# Patient Record
Sex: Female | Born: 1950
Health system: Southern US, Community
[De-identification: ages and names within clinical notes are randomized; demographics above are authoritative.]

## PROBLEM LIST (undated history)

## (undated) DIAGNOSIS — R809 Proteinuria, unspecified: Secondary | ICD-10-CM

## (undated) DIAGNOSIS — L409 Psoriasis, unspecified: Secondary | ICD-10-CM

## (undated) DIAGNOSIS — I10 Essential (primary) hypertension: Secondary | ICD-10-CM

## (undated) DIAGNOSIS — E119 Type 2 diabetes mellitus without complications: Secondary | ICD-10-CM

## (undated) DIAGNOSIS — R928 Other abnormal and inconclusive findings on diagnostic imaging of breast: Secondary | ICD-10-CM

## (undated) DIAGNOSIS — C541 Malignant neoplasm of endometrium: Secondary | ICD-10-CM

## (undated) DIAGNOSIS — L4050 Arthropathic psoriasis, unspecified: Secondary | ICD-10-CM

## (undated) DIAGNOSIS — E785 Hyperlipidemia, unspecified: Secondary | ICD-10-CM

## (undated) DIAGNOSIS — M79644 Pain in right finger(s): Secondary | ICD-10-CM

## (undated) DIAGNOSIS — M1710 Unilateral primary osteoarthritis, unspecified knee: Secondary | ICD-10-CM

## (undated) DIAGNOSIS — Q752 Hypertelorism: Secondary | ICD-10-CM

## (undated) DIAGNOSIS — E559 Vitamin D deficiency, unspecified: Secondary | ICD-10-CM

## (undated) DIAGNOSIS — Z6841 Body Mass Index (BMI) 40.0 and over, adult: Secondary | ICD-10-CM

## (undated) DIAGNOSIS — D649 Anemia, unspecified: Secondary | ICD-10-CM

## (undated) DIAGNOSIS — E039 Hypothyroidism, unspecified: Secondary | ICD-10-CM

## (undated) DIAGNOSIS — H11009 Unspecified pterygium of unspecified eye: Secondary | ICD-10-CM

## (undated) DIAGNOSIS — Z8619 Personal history of other infectious and parasitic diseases: Secondary | ICD-10-CM

## (undated) DIAGNOSIS — Z923 Personal history of irradiation: Secondary | ICD-10-CM

## (undated) HISTORY — DX: Essential (primary) hypertension: I10

## (undated) HISTORY — DX: Hypothyroidism, unspecified: E03.9

## (undated) HISTORY — DX: Other abnormal and inconclusive findings on diagnostic imaging of breast: R92.8

## (undated) HISTORY — DX: Unilateral primary osteoarthritis, unspecified knee: M17.10

## (undated) HISTORY — PX: COLONOSCOPY: SHX174

## (undated) HISTORY — DX: Hyperlipidemia, unspecified: E78.5

## (undated) HISTORY — DX: Psoriasis, unspecified: L40.9

## (undated) HISTORY — DX: Vitamin D deficiency, unspecified: E55.9

## (undated) HISTORY — DX: Morbid (severe) obesity due to excess calories: E66.01

## (undated) HISTORY — DX: Hypertelorism: Q75.2

## (undated) HISTORY — DX: Body Mass Index (BMI) 40.0 and over, adult: Z684

## (undated) HISTORY — DX: Proteinuria, unspecified: R80.9

## (undated) HISTORY — DX: Personal history of irradiation: Z92.3

## (undated) HISTORY — PX: KNEE SURGERY: SHX244

## (undated) HISTORY — DX: Personal history of other infectious and parasitic diseases: Z86.19

## (undated) HISTORY — DX: Type 2 diabetes mellitus without complications: E11.9

## (undated) HISTORY — DX: Pain in right finger(s): M79.644

## (undated) HISTORY — DX: Unspecified pterygium of unspecified eye: H11.009

## (undated) MED FILL — Fosaprepitant Dimeglumine For IV Infusion 150 MG (Base Eq): INTRAVENOUS | Qty: 5 | Status: AC

---

## 2002-09-01 ENCOUNTER — Ambulatory Visit (HOSPITAL_COMMUNITY): Admission: RE | Admit: 2002-09-01 | Discharge: 2002-09-01 | Payer: Self-pay | Admitting: Internal Medicine

## 2006-05-20 ENCOUNTER — Ambulatory Visit: Payer: Self-pay

## 2007-07-01 LAB — HM DEXA SCAN: HM DEXA SCAN: NORMAL

## 2008-01-13 ENCOUNTER — Ambulatory Visit: Payer: Self-pay | Admitting: Family Medicine

## 2011-10-04 ENCOUNTER — Encounter: Payer: Self-pay | Admitting: Rheumatology

## 2011-10-23 ENCOUNTER — Encounter: Payer: Self-pay | Admitting: Rheumatology

## 2011-11-23 ENCOUNTER — Encounter: Payer: Self-pay | Admitting: Rheumatology

## 2012-03-31 ENCOUNTER — Ambulatory Visit: Payer: Self-pay | Admitting: Family Medicine

## 2012-04-16 ENCOUNTER — Ambulatory Visit: Payer: Self-pay | Admitting: Family Medicine

## 2013-02-05 LAB — HM HEPATITIS C SCREENING LAB: HM Hepatitis Screen: NEGATIVE

## 2013-06-22 ENCOUNTER — Ambulatory Visit: Payer: Self-pay | Admitting: Family Medicine

## 2013-06-22 LAB — HM MAMMOGRAPHY

## 2013-07-05 ENCOUNTER — Ambulatory Visit: Payer: Self-pay | Admitting: Family Medicine

## 2014-03-14 ENCOUNTER — Ambulatory Visit: Payer: Self-pay | Admitting: Family Medicine

## 2014-06-14 LAB — HM COLONOSCOPY: HM Colonoscopy: NEGATIVE

## 2014-07-20 LAB — HM PAP SMEAR: HM Pap smear: NORMAL

## 2014-10-08 LAB — LIPID PANEL
Cholesterol: 107 mg/dL (ref 0–200)
HDL: 45 mg/dL (ref 35–70)
LDL Cholesterol: 45 mg/dL
Triglycerides: 86 mg/dL (ref 40–160)

## 2014-10-17 LAB — HEMOGLOBIN A1C: Hgb A1c MFr Bld: 6 % (ref 4.0–6.0)

## 2014-10-18 ENCOUNTER — Other Ambulatory Visit: Payer: Self-pay | Admitting: Family Medicine

## 2014-11-01 ENCOUNTER — Other Ambulatory Visit: Payer: Self-pay | Admitting: Family Medicine

## 2014-11-01 DIAGNOSIS — Z1231 Encounter for screening mammogram for malignant neoplasm of breast: Secondary | ICD-10-CM

## 2014-11-08 ENCOUNTER — Ambulatory Visit: Payer: Self-pay | Attending: Family Medicine

## 2015-01-07 ENCOUNTER — Other Ambulatory Visit: Payer: Self-pay | Admitting: Family Medicine

## 2015-02-21 ENCOUNTER — Encounter: Payer: Self-pay | Admitting: Family Medicine

## 2015-03-12 ENCOUNTER — Encounter: Payer: Self-pay | Admitting: Family Medicine

## 2015-03-12 DIAGNOSIS — I10 Essential (primary) hypertension: Secondary | ICD-10-CM | POA: Insufficient documentation

## 2015-03-12 DIAGNOSIS — Q752 Hypertelorism: Secondary | ICD-10-CM | POA: Insufficient documentation

## 2015-03-12 DIAGNOSIS — Z923 Personal history of irradiation: Secondary | ICD-10-CM | POA: Insufficient documentation

## 2015-03-12 DIAGNOSIS — H11009 Unspecified pterygium of unspecified eye: Secondary | ICD-10-CM | POA: Insufficient documentation

## 2015-03-12 DIAGNOSIS — E785 Hyperlipidemia, unspecified: Secondary | ICD-10-CM | POA: Insufficient documentation

## 2015-03-12 DIAGNOSIS — Z8619 Personal history of other infectious and parasitic diseases: Secondary | ICD-10-CM | POA: Insufficient documentation

## 2015-03-12 DIAGNOSIS — R809 Proteinuria, unspecified: Secondary | ICD-10-CM | POA: Insufficient documentation

## 2015-03-12 DIAGNOSIS — L409 Psoriasis, unspecified: Secondary | ICD-10-CM | POA: Insufficient documentation

## 2015-03-12 DIAGNOSIS — M1710 Unilateral primary osteoarthritis, unspecified knee: Secondary | ICD-10-CM | POA: Insufficient documentation

## 2015-03-12 DIAGNOSIS — E559 Vitamin D deficiency, unspecified: Secondary | ICD-10-CM | POA: Insufficient documentation

## 2015-03-13 ENCOUNTER — Ambulatory Visit (INDEPENDENT_AMBULATORY_CARE_PROVIDER_SITE_OTHER): Payer: BLUE CROSS/BLUE SHIELD | Admitting: Family Medicine

## 2015-03-13 ENCOUNTER — Encounter: Payer: Self-pay | Admitting: Family Medicine

## 2015-03-13 VITALS — BP 136/84 | HR 108 | Temp 98.8°F | Resp 16 | Ht 67.0 in | Wt 308.2 lb

## 2015-03-13 DIAGNOSIS — E8881 Metabolic syndrome: Secondary | ICD-10-CM | POA: Diagnosis not present

## 2015-03-13 DIAGNOSIS — E032 Hypothyroidism due to medicaments and other exogenous substances: Secondary | ICD-10-CM

## 2015-03-13 DIAGNOSIS — E119 Type 2 diabetes mellitus without complications: Secondary | ICD-10-CM

## 2015-03-13 DIAGNOSIS — Z23 Encounter for immunization: Secondary | ICD-10-CM

## 2015-03-13 DIAGNOSIS — Z923 Personal history of irradiation: Secondary | ICD-10-CM

## 2015-03-13 DIAGNOSIS — E785 Hyperlipidemia, unspecified: Secondary | ICD-10-CM

## 2015-03-13 DIAGNOSIS — Z9889 Other specified postprocedural states: Secondary | ICD-10-CM | POA: Diagnosis not present

## 2015-03-13 LAB — POCT UA - MICROALBUMIN: MICROALBUMIN (UR) POC: NEGATIVE mg/L

## 2015-03-13 LAB — POCT GLYCOSYLATED HEMOGLOBIN (HGB A1C): Hemoglobin A1C: 6.2

## 2015-03-13 MED ORDER — ATORVASTATIN CALCIUM 40 MG PO TABS: 40.0000 mg | ORAL_TABLET | Freq: Every day | ORAL | Status: DC

## 2015-03-13 MED ORDER — LEVOTHYROXINE SODIUM 175 MCG PO TABS: 175.0000 ug | ORAL_TABLET | Freq: Every day | ORAL | Status: DC

## 2015-03-13 NOTE — Patient Instructions (Signed)
Lipid panel shows low HDL : to improve HDL patient  needs to eat tree nuts ( pecans/pistachios/almonds ) four times weekly, eat fish two times weekly  and exercise  at least 150 minutes per week  Discussed with the patient the risk posed by an increased BMI. Discussed importance of portion control, calorie counting and at least 150 minutes of physical activity weekly. Avoid sweet beverages and drink more water. Eat at least 6 servings of fruit and vegetables daily
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
## 2015-03-18 ENCOUNTER — Other Ambulatory Visit
Admission: RE | Admit: 2015-03-18 | Discharge: 2015-03-18 | Disposition: A | Discharge status: Home / Self Care | Payer: BLUE CROSS/BLUE SHIELD | Source: Ambulatory Visit | Attending: Family Medicine | Admitting: Family Medicine

## 2015-03-18 DIAGNOSIS — E032 Hypothyroidism due to medicaments and other exogenous substances: Secondary | ICD-10-CM | POA: Insufficient documentation

## 2015-03-18 LAB — TSH: TSH: 5.661 u[IU]/mL — ABNORMAL HIGH (ref 0.350–4.500)

## 2015-04-18 ENCOUNTER — Ambulatory Visit: Payer: Self-pay | Admitting: Family Medicine

## 2015-05-26 ENCOUNTER — Ambulatory Visit (INDEPENDENT_AMBULATORY_CARE_PROVIDER_SITE_OTHER): Payer: BLUE CROSS/BLUE SHIELD | Admitting: Family Medicine

## 2015-05-26 ENCOUNTER — Encounter: Payer: Self-pay | Admitting: Family Medicine

## 2015-05-26 VITALS — BP 118/86 | HR 104 | Temp 98.4°F | Resp 14 | Ht 67.0 in | Wt 308.0 lb

## 2015-05-26 DIAGNOSIS — M7662 Achilles tendinitis, left leg: Secondary | ICD-10-CM | POA: Diagnosis not present

## 2015-05-26 DIAGNOSIS — J309 Allergic rhinitis, unspecified: Secondary | ICD-10-CM

## 2015-05-26 DIAGNOSIS — Z1239 Encounter for other screening for malignant neoplasm of breast: Secondary | ICD-10-CM | POA: Diagnosis not present

## 2015-05-26 DIAGNOSIS — J3089 Other allergic rhinitis: Secondary | ICD-10-CM | POA: Insufficient documentation

## 2015-05-26 MED ORDER — MELOXICAM 15 MG PO TABS: 15.0000 mg | ORAL_TABLET | Freq: Every day | ORAL | Status: DC

## 2015-05-26 MED ORDER — FLUTICASONE PROPIONATE 50 MCG/ACT NA SUSP: 2.0000 | Freq: Every day | NASAL | Status: DC
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
## 2015-05-26 NOTE — Patient Instructions (Signed)
Achilles Tendinitis Achilles tendinitis is inflammation of the tough, cord-like band that attaches the lower muscles of your leg to your heel (Achilles tendon). It is usually caused by overusing the tendon and joint involved.  CAUSES Achilles tendinitis can happen because of:  A sudden increase in exercise or activity (such as running).  Doing the same exercises or activities (such as jumping) over and over.  Not warming up calf muscles before exercising.  Exercising in shoes that are worn out or not made for exercise.  Having arthritis or a bone growth on the back of the heel bone. This can rub against the tendon and hurt the tendon. SIGNS AND SYMPTOMS The most common symptoms are:  Pain in the back of the leg, just above the heel. The pain usually gets worse with exercise and better with rest.  Stiffness or soreness in the back of the leg, especially in the morning.  Swelling of the skin over the Achilles tendon.  Trouble standing on tiptoe. Sometimes, an Achilles tendon tears (ruptures). Symptoms of an Achilles tendon rupture can include:  Sudden, severe pain in the back of the leg.  Trouble putting weight on the foot or walking normally. DIAGNOSIS Achilles tendinitis will be diagnosed based on symptoms and a physical examination. An X-ray may be done to check if another condition is causing your symptoms. An MRI may be ordered if your health care provider suspects you may have completely torn your tendon, which is called an Achilles tendon rupture.  TREATMENT  Achilles tendinitis usually gets better over time. It can take weeks to months to heal completely. Treatment focuses on treating the symptoms and helping the injury heal. HOME CARE INSTRUCTIONS   Rest your Achilles tendon and avoid activities that cause pain.  Apply ice to the injured area:  Put ice in a plastic bag.  Place a towel between your skin and the bag.  Leave the ice on for 20 minutes, 2-3 times a  day  Try to avoid using the tendon (other than gentle range of motion) while the tendon is painful. Do not resume use until instructed by your health care provider. Then begin use gradually. Do not increase use to the point of pain. If pain does develop, decrease use and continue the above measures. Gradually increase activities that do not cause discomfort until you achieve normal use.  Do exercises to make your calf muscles stronger and more flexible. Your health care provider or physical therapist can recommend exercises for you to do.  Wrap your ankle with an elastic bandage or other wrap. This can help keep your tendon from moving too much. Your health care provider will show you how to wrap your ankle correctly.  Only take over-the-counter or prescription medicines for pain, discomfort, or fever as directed by your health care provider. SEEK MEDICAL CARE IF:   Your pain and swelling increase or pain is uncontrolled with medicines.  You develop new, unexplained symptoms or your symptoms get worse.  You are unable to move your toes or foot.  You develop warmth and swelling in your foot.  You have an unexplained temperature. MAKE SURE YOU:   Understand these instructions.  Will watch your condition.  Will get help right away if you are not doing well or get worse.   This information is not intended to replace advice given to you by your health care provider. Make sure you discuss any questions you have with your health care provider.   Document Released:   03/20/2005 Document Revised: 07/01/2014 Document Reviewed: 01/20/2013 [employer] Interactive Patient Education 2016 [employer]  

## 2015-07-10 ENCOUNTER — Other Ambulatory Visit: Payer: Self-pay | Admitting: Family Medicine

## 2015-07-10 ENCOUNTER — Ambulatory Visit
Admission: RE | Admit: 2015-07-10 | Discharge: 2015-07-10 | Disposition: A | Discharge status: Home / Self Care | Payer: BLUE CROSS/BLUE SHIELD | Source: Ambulatory Visit | Attending: Family Medicine | Admitting: Family Medicine

## 2015-07-10 DIAGNOSIS — Z1239 Encounter for other screening for malignant neoplasm of breast: Secondary | ICD-10-CM

## 2015-07-10 DIAGNOSIS — Z1231 Encounter for screening mammogram for malignant neoplasm of breast: Secondary | ICD-10-CM | POA: Insufficient documentation

## 2015-07-10 DIAGNOSIS — R928 Other abnormal and inconclusive findings on diagnostic imaging of breast: Secondary | ICD-10-CM

## 2015-07-14 ENCOUNTER — Ambulatory Visit (INDEPENDENT_AMBULATORY_CARE_PROVIDER_SITE_OTHER): Payer: BLUE CROSS/BLUE SHIELD | Admitting: Family Medicine

## 2015-07-14 ENCOUNTER — Encounter: Payer: Self-pay | Admitting: Family Medicine

## 2015-07-14 VITALS — BP 122/88 | HR 107 | Temp 98.0°F | Resp 16 | Wt 309.9 lb

## 2015-07-14 DIAGNOSIS — E785 Hyperlipidemia, unspecified: Secondary | ICD-10-CM | POA: Diagnosis not present

## 2015-07-14 DIAGNOSIS — E1129 Type 2 diabetes mellitus with other diabetic kidney complication: Secondary | ICD-10-CM | POA: Diagnosis not present

## 2015-07-14 DIAGNOSIS — R809 Proteinuria, unspecified: Secondary | ICD-10-CM | POA: Diagnosis not present

## 2015-07-14 DIAGNOSIS — I10 Essential (primary) hypertension: Secondary | ICD-10-CM | POA: Diagnosis not present

## 2015-07-14 DIAGNOSIS — E032 Hypothyroidism due to medicaments and other exogenous substances: Secondary | ICD-10-CM

## 2015-07-14 DIAGNOSIS — Z23 Encounter for immunization: Secondary | ICD-10-CM | POA: Diagnosis not present

## 2015-07-14 DIAGNOSIS — Z923 Personal history of irradiation: Secondary | ICD-10-CM

## 2015-07-14 DIAGNOSIS — M7662 Achilles tendinitis, left leg: Secondary | ICD-10-CM

## 2015-07-14 LAB — POCT GLYCOSYLATED HEMOGLOBIN (HGB A1C): Hemoglobin A1C: 6

## 2015-07-14 LAB — POCT UA - MICROALBUMIN: MICROALBUMIN (UR) POC: NEGATIVE mg/L

## 2015-07-14 MED ORDER — MELOXICAM 15 MG PO TABS: 15.0000 mg | ORAL_TABLET | Freq: Every day | ORAL | Status: DC

## 2015-07-14 MED ORDER — LEVOTHYROXINE SODIUM 175 MCG PO TABS: 175.0000 ug | ORAL_TABLET | Freq: Every day | ORAL | Status: DC

## 2015-07-14 MED ORDER — IRBESARTAN-HYDROCHLOROTHIAZIDE 150-12.5 MG PO TABS: 1.0000 | ORAL_TABLET | Freq: Every day | ORAL | Status: DC

## 2015-07-14 MED ORDER — ATORVASTATIN CALCIUM 40 MG PO TABS: 40.0000 mg | ORAL_TABLET | Freq: Every day | ORAL | Status: DC
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
## 2015-07-14 NOTE — Addendum Note (Signed)
Addended by: Inda Coke on: 07/14/2015 09:36 AM   Modules accepted: Orders

## 2015-07-15 ENCOUNTER — Other Ambulatory Visit
Admission: RE | Admit: 2015-07-15 | Discharge: 2015-07-15 | Disposition: A | Discharge status: Home / Self Care | Payer: BLUE CROSS/BLUE SHIELD | Source: Ambulatory Visit | Attending: Family Medicine | Admitting: Family Medicine

## 2015-07-15 DIAGNOSIS — Z029 Encounter for administrative examinations, unspecified: Secondary | ICD-10-CM | POA: Diagnosis present

## 2015-07-15 LAB — COMPREHENSIVE METABOLIC PANEL
ALT: 24 U/L (ref 14–54)
ANION GAP: 5 (ref 5–15)
AST: 29 U/L (ref 15–41)
Albumin: 4 g/dL (ref 3.5–5.0)
Alkaline Phosphatase: 76 U/L (ref 38–126)
BILIRUBIN TOTAL: 0.9 mg/dL (ref 0.3–1.2)
BUN: 14 mg/dL (ref 6–20)
CO2: 29 mmol/L (ref 22–32)
Calcium: 9.1 mg/dL (ref 8.9–10.3)
Chloride: 106 mmol/L (ref 101–111)
Creatinine, Ser: 0.77 mg/dL (ref 0.44–1.00)
GFR calc Af Amer: >60 mL/min (ref >60)
Glucose, Bld: 104 mg/dL — ABNORMAL HIGH (ref 65–99)
POTASSIUM: 4.1 mmol/L (ref 3.5–5.1)
Sodium: 140 mmol/L (ref 135–145)
TOTAL PROTEIN: 8 g/dL (ref 6.5–8.1)

## 2015-07-15 LAB — LIPID PANEL
CHOL/HDL RATIO: 3.7 ratio
CHOLESTEROL: 164 mg/dL (ref 0–200)
HDL: 44 mg/dL (ref >40)
LDL CALC: 97 mg/dL (ref 0–99)
TRIGLYCERIDES: 115 mg/dL (ref <150)
VLDL: 23 mg/dL (ref 0–40)

## 2015-07-15 LAB — TSH: TSH: 9.264 u[IU]/mL — AB (ref 0.350–4.500)

## 2015-07-16 ENCOUNTER — Other Ambulatory Visit: Payer: Self-pay | Admitting: Family Medicine

## 2015-07-16 DIAGNOSIS — E032 Hypothyroidism due to medicaments and other exogenous substances: Secondary | ICD-10-CM

## 2015-07-16 DIAGNOSIS — E038 Other specified hypothyroidism: Secondary | ICD-10-CM

## 2015-07-16 MED ORDER — LEVOTHYROXINE SODIUM 175 MCG PO TABS: 175.0000 ug | ORAL_TABLET | Freq: Every day | ORAL | Status: DC

## 2015-07-28 ENCOUNTER — Other Ambulatory Visit: Payer: Self-pay | Admitting: Family Medicine

## 2015-07-28 NOTE — Telephone Encounter (Signed)
Patient requesting refill. 

## 2015-08-18 ENCOUNTER — Ambulatory Visit
Admission: RE | Admit: 2015-08-18 | Discharge: 2015-08-18 | Disposition: A | Discharge status: Home / Self Care | Payer: BLUE CROSS/BLUE SHIELD | Source: Ambulatory Visit | Attending: Family Medicine | Admitting: Family Medicine

## 2015-08-18 DIAGNOSIS — N6001 Solitary cyst of right breast: Secondary | ICD-10-CM | POA: Insufficient documentation

## 2015-08-18 DIAGNOSIS — R928 Other abnormal and inconclusive findings on diagnostic imaging of breast: Secondary | ICD-10-CM | POA: Insufficient documentation

## 2015-08-26 ENCOUNTER — Other Ambulatory Visit
Admission: RE | Admit: 2015-08-26 | Discharge: 2015-08-26 | Disposition: A | Discharge status: Home / Self Care | Payer: BLUE CROSS/BLUE SHIELD | Source: Ambulatory Visit | Attending: Family Medicine | Admitting: Family Medicine

## 2015-08-26 DIAGNOSIS — E038 Other specified hypothyroidism: Secondary | ICD-10-CM | POA: Diagnosis present

## 2015-08-26 LAB — TSH: TSH: 2.251 u[IU]/mL (ref 0.350–4.500)

## 2015-08-27 ENCOUNTER — Other Ambulatory Visit: Payer: Self-pay | Admitting: Family Medicine

## 2015-11-13 ENCOUNTER — Ambulatory Visit: Payer: BLUE CROSS/BLUE SHIELD | Admitting: Family Medicine

## 2016-01-28 ENCOUNTER — Other Ambulatory Visit: Payer: Self-pay | Admitting: Family Medicine

## 2016-01-28 DIAGNOSIS — E032 Hypothyroidism due to medicaments and other exogenous substances: Secondary | ICD-10-CM

## 2016-01-29 NOTE — Telephone Encounter (Signed)
Have tried contacting this patient all day and the line is still busy. Not able to reach patient.

## 2016-06-23 IMAGING — MG MM DIAG BREAST TOMO UNI RIGHT
6 series · 6 of 14 positions shown · non-contrast
Comparison: Previous exam(s).

CLINICAL DATA: 64-year-old female for evaluation of possible right
breast mass on screening mammogram.

EXAM:
DIGITAL DIAGNOSTIC RIGHT MAMMOGRAM WITH 3D TOMOSYNTHESIS WITH CAD
ULTRASOUND RIGHT BREAST

[R MLO]
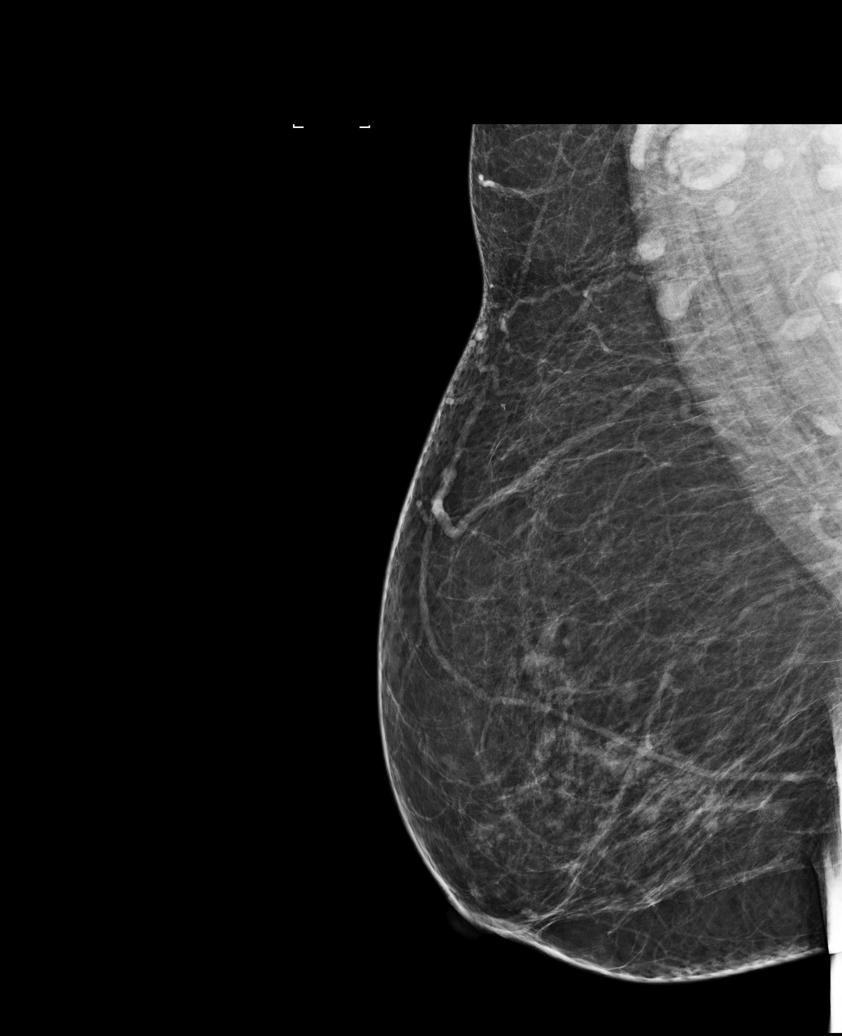

[R MLO synth-2D]
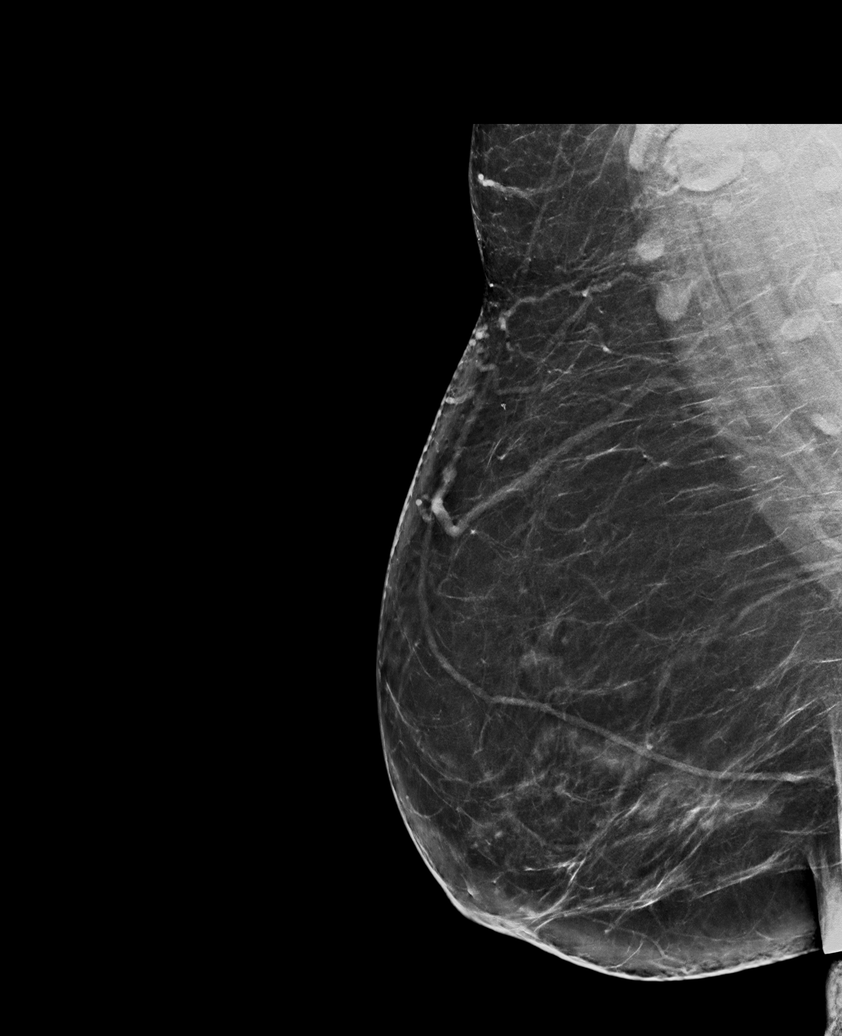

[R CC]
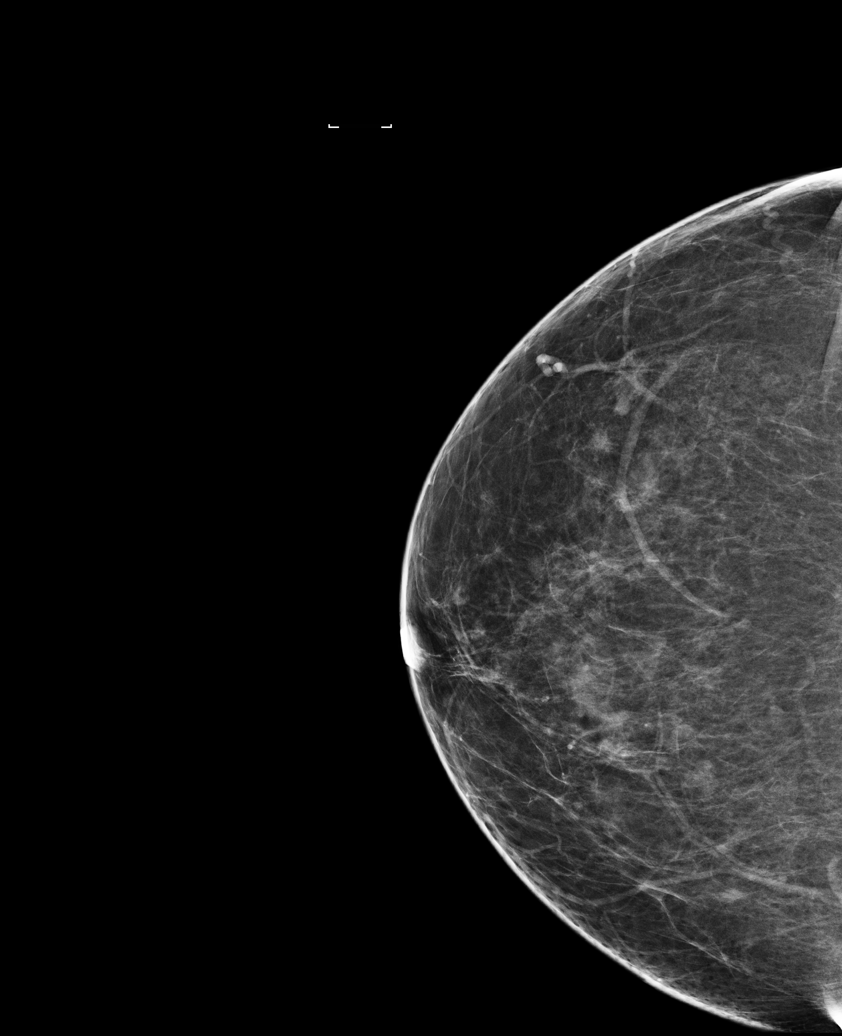

[R CC synth-2D]
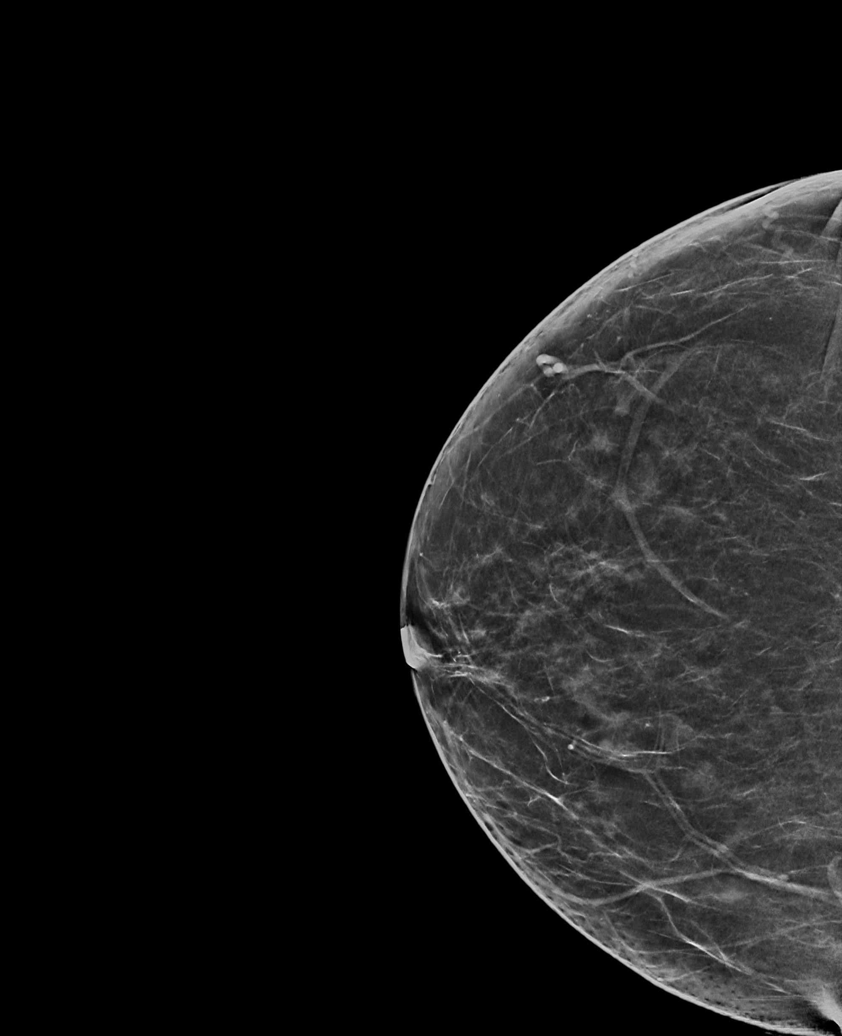

[R CC tomo · tomo slice 31/62.0]
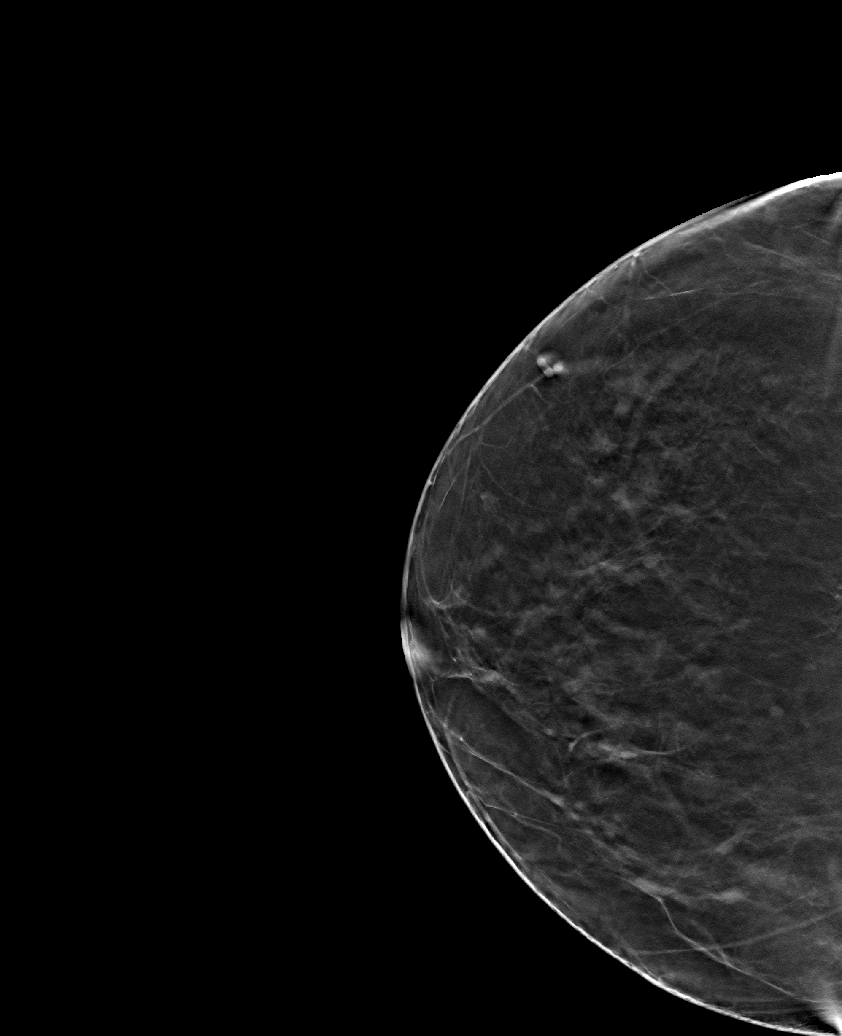

[R MLO tomo · tomo slice 45/88.0]
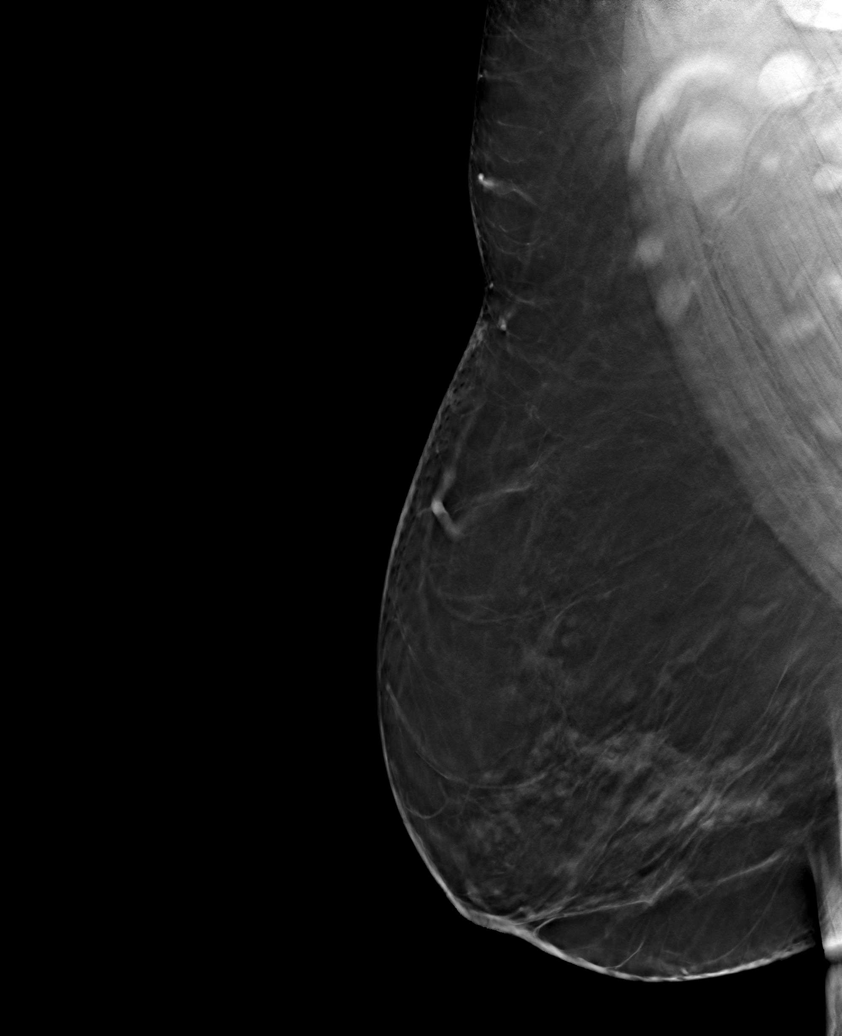

[6 of 14 positions shown; findings below may reference images not displayed]

ACR Breast Density Category b: There are scattered areas of
fibroglandular density.
FINDINGS: 2D and 3D full field views of the right breast demonstrate a
circumscribed oval mass within the inner right breast.

Mammographic images were processed with CAD.

On physical exam, no palpable abnormalities identified within the
inner right breast.

Targeted ultrasound is performed, showing a 6 x 2 x 5 mm simple cyst
at the 3 o'clock position of the right breast 6 cm from the nipple,
corresponding to the screening study finding. There is no evidence
of solid mass, distortion or worrisome shadowing within the inner
right breast.
IMPRESSION: Benign cyst in the inner right breast corresponding to the screening
study finding.

RECOMMENDATION:
Bilateral screening mammograms in 1 year.

I have discussed the findings and recommendations with the patient.
Results were also provided in writing at the conclusion of the
visit. If applicable, a reminder letter will be sent to the patient
regarding the next appointment.

BI-RADS CATEGORY  2: Benign.

## 2016-07-17 ENCOUNTER — Other Ambulatory Visit: Payer: Self-pay | Admitting: Family Medicine

## 2016-07-17 NOTE — Telephone Encounter (Signed)
Patient requesting refill of Irbesartan-HCTZ to [facility].

## 2016-07-23 ENCOUNTER — Telehealth: Payer: Self-pay | Admitting: Family Medicine

## 2016-07-23 DIAGNOSIS — E032 Hypothyroidism due to medicaments and other exogenous substances: Secondary | ICD-10-CM

## 2016-07-23 NOTE — Telephone Encounter (Signed)
LFT MESS ON CELL AND HM PHONE TO [facility] APPT

## 2016-07-24 ENCOUNTER — Ambulatory Visit (INDEPENDENT_AMBULATORY_CARE_PROVIDER_SITE_OTHER): Payer: BLUE CROSS/BLUE SHIELD | Admitting: Family Medicine

## 2016-07-24 ENCOUNTER — Encounter: Payer: Self-pay | Admitting: Family Medicine

## 2016-07-24 VITALS — BP 124/78 | HR 117 | Temp 98.8°F | Resp 18 | Ht 67.0 in | Wt 293.1 lb

## 2016-07-24 DIAGNOSIS — E1129 Type 2 diabetes mellitus with other diabetic kidney complication: Secondary | ICD-10-CM

## 2016-07-24 DIAGNOSIS — Z923 Personal history of irradiation: Secondary | ICD-10-CM

## 2016-07-24 DIAGNOSIS — E032 Hypothyroidism due to medicaments and other exogenous substances: Secondary | ICD-10-CM | POA: Diagnosis not present

## 2016-07-24 DIAGNOSIS — E785 Hyperlipidemia, unspecified: Secondary | ICD-10-CM

## 2016-07-24 DIAGNOSIS — I10 Essential (primary) hypertension: Secondary | ICD-10-CM

## 2016-07-24 DIAGNOSIS — Z1231 Encounter for screening mammogram for malignant neoplasm of breast: Secondary | ICD-10-CM

## 2016-07-24 DIAGNOSIS — R809 Proteinuria, unspecified: Secondary | ICD-10-CM | POA: Diagnosis not present

## 2016-07-24 DIAGNOSIS — Z1239 Encounter for other screening for malignant neoplasm of breast: Secondary | ICD-10-CM

## 2016-07-24 LAB — COMPLETE METABOLIC PANEL WITH GFR
ALBUMIN: 3.5 g/dL — AB (ref 3.6–5.1)
ALK PHOS: 96 U/L (ref 33–130)
ALT: 16 U/L (ref 6–29)
AST: 23 U/L (ref 10–35)
BILIRUBIN TOTAL: 0.4 mg/dL (ref 0.2–1.2)
BUN: 16 mg/dL (ref 7–25)
CALCIUM: 9.1 mg/dL (ref 8.6–10.4)
CO2: 28 mmol/L (ref 20–31)
Chloride: 104 mmol/L (ref 98–110)
Creat: 0.84 mg/dL (ref 0.50–0.99)
GFR, EST AFRICAN AMERICAN: 86 mL/min (ref >=60)
GFR, EST NON AFRICAN AMERICAN: 74 mL/min (ref >=60)
GLUCOSE: 99 mg/dL (ref 65–99)
Potassium: 4.1 mmol/L (ref 3.5–5.3)
SODIUM: 139 mmol/L (ref 135–146)
TOTAL PROTEIN: 7.7 g/dL (ref 6.1–8.1)

## 2016-07-24 LAB — LIPID PANEL
Cholesterol: 134 mg/dL (ref <200)
HDL: 40 mg/dL — ABNORMAL LOW (ref >50)
LDL Cholesterol: 77 mg/dL (ref <100)
Total CHOL/HDL Ratio: 3.4 Ratio (ref <5.0)
Triglycerides: 86 mg/dL (ref <150)
VLDL: 17 mg/dL (ref <30)

## 2016-07-24 LAB — HEMOGLOBIN A1C
HEMOGLOBIN A1C: 5.7 % — AB (ref <5.7)
Mean Plasma Glucose: 117 mg/dL

## 2016-07-24 LAB — POCT UA - MICROALBUMIN: Microalbumin Ur, POC: 20 mg/L

## 2016-07-24 LAB — TSH: TSH: 0.53 m[IU]/L

## 2016-07-24 MED ORDER — IRBESARTAN-HYDROCHLOROTHIAZIDE 150-12.5 MG PO TABS: 1.0000 | ORAL_TABLET | Freq: Every day | ORAL | 1 refills | Status: DC
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
## 2016-07-25 ENCOUNTER — Encounter: Payer: Self-pay | Admitting: Family Medicine

## 2016-07-25 DIAGNOSIS — E46 Unspecified protein-calorie malnutrition: Secondary | ICD-10-CM | POA: Insufficient documentation

## 2016-07-25 DIAGNOSIS — E441 Mild protein-calorie malnutrition: Secondary | ICD-10-CM | POA: Insufficient documentation

## 2016-08-28 ENCOUNTER — Other Ambulatory Visit: Payer: Self-pay | Admitting: Family Medicine

## 2016-08-28 DIAGNOSIS — E032 Hypothyroidism due to medicaments and other exogenous substances: Secondary | ICD-10-CM

## 2016-10-04 ENCOUNTER — Encounter: Payer: Self-pay | Admitting: Family Medicine

## 2016-10-04 ENCOUNTER — Ambulatory Visit (INDEPENDENT_AMBULATORY_CARE_PROVIDER_SITE_OTHER): Payer: BLUE CROSS/BLUE SHIELD | Admitting: Family Medicine

## 2016-10-04 VITALS — BP 128/72 | HR 106 | Temp 98.0°F | Resp 16 | Ht 67.0 in | Wt 282.0 lb

## 2016-10-04 DIAGNOSIS — Z01419 Encounter for gynecological examination (general) (routine) without abnormal findings: Secondary | ICD-10-CM | POA: Diagnosis not present

## 2016-10-04 DIAGNOSIS — E032 Hypothyroidism due to medicaments and other exogenous substances: Secondary | ICD-10-CM | POA: Diagnosis not present

## 2016-10-04 LAB — TSH: TSH: 1.05 mIU/L
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
## 2016-10-04 NOTE — Patient Instructions (Signed)

## 2016-10-16 ENCOUNTER — Ambulatory Visit
Admission: RE | Admit: 2016-10-16 | Discharge: 2016-10-16 | Disposition: A | Discharge status: Home / Self Care | Payer: BLUE CROSS/BLUE SHIELD | Source: Ambulatory Visit | Attending: Family Medicine | Admitting: Family Medicine

## 2016-10-16 DIAGNOSIS — Z1239 Encounter for other screening for malignant neoplasm of breast: Secondary | ICD-10-CM

## 2016-10-16 DIAGNOSIS — Z1231 Encounter for screening mammogram for malignant neoplasm of breast: Secondary | ICD-10-CM | POA: Diagnosis present

## 2016-10-17 ENCOUNTER — Other Ambulatory Visit: Payer: Self-pay | Admitting: Family Medicine

## 2016-10-17 DIAGNOSIS — N6310 Unspecified lump in the right breast, unspecified quadrant: Secondary | ICD-10-CM

## 2016-10-17 DIAGNOSIS — R928 Other abnormal and inconclusive findings on diagnostic imaging of breast: Secondary | ICD-10-CM

## 2016-10-23 ENCOUNTER — Ambulatory Visit (INDEPENDENT_AMBULATORY_CARE_PROVIDER_SITE_OTHER): Payer: BLUE CROSS/BLUE SHIELD | Admitting: Family Medicine

## 2016-10-23 ENCOUNTER — Encounter: Payer: Self-pay | Admitting: Family Medicine

## 2016-10-23 VITALS — BP 116/64 | HR 96 | Temp 97.7°F | Resp 16 | Ht 67.0 in | Wt 285.3 lb

## 2016-10-23 DIAGNOSIS — I10 Essential (primary) hypertension: Secondary | ICD-10-CM | POA: Diagnosis not present

## 2016-10-23 DIAGNOSIS — E1169 Type 2 diabetes mellitus with other specified complication: Secondary | ICD-10-CM

## 2016-10-23 DIAGNOSIS — E785 Hyperlipidemia, unspecified: Secondary | ICD-10-CM

## 2016-10-23 DIAGNOSIS — J3089 Other allergic rhinitis: Secondary | ICD-10-CM

## 2016-10-23 DIAGNOSIS — E441 Mild protein-calorie malnutrition: Secondary | ICD-10-CM

## 2016-10-23 DIAGNOSIS — E1129 Type 2 diabetes mellitus with other diabetic kidney complication: Secondary | ICD-10-CM

## 2016-10-23 DIAGNOSIS — Z923 Personal history of irradiation: Secondary | ICD-10-CM | POA: Diagnosis not present

## 2016-10-23 DIAGNOSIS — R809 Proteinuria, unspecified: Secondary | ICD-10-CM | POA: Diagnosis not present

## 2016-10-23 DIAGNOSIS — E890 Postprocedural hypothyroidism: Secondary | ICD-10-CM | POA: Diagnosis not present

## 2016-10-23 LAB — POCT GLYCOSYLATED HEMOGLOBIN (HGB A1C): Hemoglobin A1C: 5.9

## 2016-10-23 MED ORDER — ROSUVASTATIN CALCIUM 5 MG PO TABS: 5.0000 mg | ORAL_TABLET | Freq: Every day | ORAL | 1 refills | Status: DC

## 2016-10-23 MED ORDER — IRBESARTAN-HYDROCHLOROTHIAZIDE 150-12.5 MG PO TABS: 1.0000 | ORAL_TABLET | Freq: Every day | ORAL | 1 refills | Status: DC
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
## 2016-10-24 ENCOUNTER — Ambulatory Visit
Admission: RE | Admit: 2016-10-24 | Discharge: 2016-10-24 | Disposition: A | Discharge status: Home / Self Care | Payer: BLUE CROSS/BLUE SHIELD | Source: Ambulatory Visit | Attending: Family Medicine | Admitting: Family Medicine

## 2016-10-24 DIAGNOSIS — N6001 Solitary cyst of right breast: Secondary | ICD-10-CM | POA: Diagnosis not present

## 2016-10-24 DIAGNOSIS — N6310 Unspecified lump in the right breast, unspecified quadrant: Secondary | ICD-10-CM

## 2016-10-24 DIAGNOSIS — R928 Other abnormal and inconclusive findings on diagnostic imaging of breast: Secondary | ICD-10-CM

## 2017-01-17 ENCOUNTER — Ambulatory Visit: Payer: BLUE CROSS/BLUE SHIELD | Admitting: Family Medicine

## 2017-02-25 ENCOUNTER — Ambulatory Visit: Payer: BLUE CROSS/BLUE SHIELD | Admitting: Family Medicine

## 2017-03-02 ENCOUNTER — Other Ambulatory Visit: Payer: Self-pay | Admitting: Family Medicine

## 2017-03-02 DIAGNOSIS — E032 Hypothyroidism due to medicaments and other exogenous substances: Secondary | ICD-10-CM

## 2017-03-11 ENCOUNTER — Encounter: Payer: Self-pay | Admitting: Family Medicine

## 2017-03-11 ENCOUNTER — Ambulatory Visit (INDEPENDENT_AMBULATORY_CARE_PROVIDER_SITE_OTHER): Payer: BLUE CROSS/BLUE SHIELD | Admitting: Family Medicine

## 2017-03-11 VITALS — BP 124/76 | HR 103 | Temp 97.7°F | Resp 18 | Ht 67.0 in | Wt 283.1 lb

## 2017-03-11 DIAGNOSIS — Z23 Encounter for immunization: Secondary | ICD-10-CM | POA: Diagnosis not present

## 2017-03-11 DIAGNOSIS — R809 Proteinuria, unspecified: Secondary | ICD-10-CM | POA: Diagnosis not present

## 2017-03-11 DIAGNOSIS — E1169 Type 2 diabetes mellitus with other specified complication: Secondary | ICD-10-CM | POA: Diagnosis not present

## 2017-03-11 DIAGNOSIS — J3089 Other allergic rhinitis: Secondary | ICD-10-CM

## 2017-03-11 DIAGNOSIS — E441 Mild protein-calorie malnutrition: Secondary | ICD-10-CM

## 2017-03-11 DIAGNOSIS — M7662 Achilles tendinitis, left leg: Secondary | ICD-10-CM | POA: Diagnosis not present

## 2017-03-11 DIAGNOSIS — I10 Essential (primary) hypertension: Secondary | ICD-10-CM | POA: Diagnosis not present

## 2017-03-11 DIAGNOSIS — Z923 Personal history of irradiation: Secondary | ICD-10-CM

## 2017-03-11 DIAGNOSIS — E785 Hyperlipidemia, unspecified: Secondary | ICD-10-CM

## 2017-03-11 DIAGNOSIS — E1129 Type 2 diabetes mellitus with other diabetic kidney complication: Secondary | ICD-10-CM

## 2017-03-11 LAB — POCT UA - MICROALBUMIN: Microalbumin Ur, POC: 20 mg/L

## 2017-03-11 LAB — POCT GLYCOSYLATED HEMOGLOBIN (HGB A1C): Hemoglobin A1C: 5.8

## 2017-03-11 MED ORDER — MELOXICAM 15 MG PO TABS: 15.0000 mg | ORAL_TABLET | Freq: Every day | ORAL | 0 refills | Status: DC

## 2017-03-11 MED ORDER — ROSUVASTATIN CALCIUM 5 MG PO TABS: 5.0000 mg | ORAL_TABLET | Freq: Every day | ORAL | 1 refills | Status: DC

## 2017-03-11 MED ORDER — IRBESARTAN-HYDROCHLOROTHIAZIDE 150-12.5 MG PO TABS: 1.0000 | ORAL_TABLET | Freq: Every day | ORAL | 1 refills | Status: DC
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
## 2017-04-11 ENCOUNTER — Other Ambulatory Visit: Payer: Self-pay | Admitting: Family Medicine

## 2017-04-11 DIAGNOSIS — M7662 Achilles tendinitis, left leg: Secondary | ICD-10-CM

## 2017-05-08 ENCOUNTER — Other Ambulatory Visit: Payer: Self-pay | Admitting: Family Medicine

## 2017-05-08 DIAGNOSIS — M7662 Achilles tendinitis, left leg: Secondary | ICD-10-CM

## 2017-05-08 NOTE — Telephone Encounter (Signed)
Refill request for general medication: Meloxicam to [facility]  Last office visit: 03/11/2017  Next visit: 07/11/2017

## 2017-05-12 ENCOUNTER — Other Ambulatory Visit: Payer: Self-pay | Admitting: Family Medicine

## 2017-05-12 DIAGNOSIS — M7662 Achilles tendinitis, left leg: Secondary | ICD-10-CM

## 2017-05-12 NOTE — Telephone Encounter (Signed)
Refill request for general medication: Meloxicam 15 mg  Last office visit: 03/11/2017  Last physical exam: 10/04/2016  Follow up visit: 07/11/2017

## 2017-05-30 ENCOUNTER — Other Ambulatory Visit: Payer: Self-pay | Admitting: Family Medicine

## 2017-05-30 DIAGNOSIS — E032 Hypothyroidism due to medicaments and other exogenous substances: Secondary | ICD-10-CM

## 2017-05-30 NOTE — Telephone Encounter (Signed)
Refill request for thyroid medication. Levothyroxine   Last Visit: 03/11/2017  Lab Results  Component Value Date   TSH 1.05 10/04/2016     Follow up on 07/11/2017

## 2017-07-11 ENCOUNTER — Ambulatory Visit: Payer: BLUE CROSS/BLUE SHIELD | Admitting: Family Medicine

## 2017-07-11 ENCOUNTER — Encounter: Payer: Self-pay | Admitting: Family Medicine

## 2017-07-11 VITALS — BP 120/84 | HR 96 | Resp 14 | Ht 67.0 in | Wt 285.8 lb

## 2017-07-11 DIAGNOSIS — E890 Postprocedural hypothyroidism: Secondary | ICD-10-CM

## 2017-07-11 DIAGNOSIS — R809 Proteinuria, unspecified: Secondary | ICD-10-CM

## 2017-07-11 DIAGNOSIS — E1169 Type 2 diabetes mellitus with other specified complication: Secondary | ICD-10-CM

## 2017-07-11 DIAGNOSIS — E559 Vitamin D deficiency, unspecified: Secondary | ICD-10-CM

## 2017-07-11 DIAGNOSIS — I10 Essential (primary) hypertension: Secondary | ICD-10-CM

## 2017-07-11 DIAGNOSIS — E1129 Type 2 diabetes mellitus with other diabetic kidney complication: Secondary | ICD-10-CM

## 2017-07-11 DIAGNOSIS — E441 Mild protein-calorie malnutrition: Secondary | ICD-10-CM

## 2017-07-11 DIAGNOSIS — Z1231 Encounter for screening mammogram for malignant neoplasm of breast: Secondary | ICD-10-CM | POA: Diagnosis not present

## 2017-07-11 DIAGNOSIS — E785 Hyperlipidemia, unspecified: Secondary | ICD-10-CM | POA: Diagnosis not present

## 2017-07-11 DIAGNOSIS — Z1239 Encounter for other screening for malignant neoplasm of breast: Secondary | ICD-10-CM

## 2017-07-11 LAB — POCT UA - MICROALBUMIN
ALBUMIN/CREATININE RATIO, URINE, POC: NEGATIVE
CREATININE, POC: NEGATIVE mg/dL
Microalbumin Ur, POC: NEGATIVE mg/L

## 2017-07-11 LAB — POCT GLYCOSYLATED HEMOGLOBIN (HGB A1C): HEMOGLOBIN A1C: 5.7

## 2017-07-11 MED ORDER — IRBESARTAN-HYDROCHLOROTHIAZIDE 150-12.5 MG PO TABS: 1.0000 | ORAL_TABLET | Freq: Every day | ORAL | 1 refills | Status: DC

## 2017-07-11 MED ORDER — LEVOTHYROXINE SODIUM 175 MCG PO TABS: 175.0000 ug | ORAL_TABLET | Freq: Every day | ORAL | 1 refills | Status: DC

## 2017-07-11 MED ORDER — ROSUVASTATIN CALCIUM 5 MG PO TABS: 5.0000 mg | ORAL_TABLET | Freq: Every day | ORAL | 1 refills | Status: DC
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
## 2017-07-14 LAB — COMPLETE METABOLIC PANEL WITH GFR
AG RATIO: 1 (calc) (ref 1.0–2.5)
ALKALINE PHOSPHATASE (APISO): 95 U/L (ref 33–130)
ALT: 15 U/L (ref 6–29)
AST: 20 U/L (ref 10–35)
Albumin: 3.7 g/dL (ref 3.6–5.1)
BILIRUBIN TOTAL: 0.5 mg/dL (ref 0.2–1.2)
BUN: 17 mg/dL (ref 7–25)
CHLORIDE: 106 mmol/L (ref 98–110)
CO2: 28 mmol/L (ref 20–32)
Calcium: 9 mg/dL (ref 8.6–10.4)
Creat: 0.92 mg/dL (ref 0.50–0.99)
GFR, EST AFRICAN AMERICAN: 76 mL/min/{1.73_m2} (ref >=60)
GFR, Est Non African American: 66 mL/min/{1.73_m2} (ref >=60)
Globulin: 3.7 g/dL (calc) (ref 1.9–3.7)
Glucose, Bld: 98 mg/dL (ref 65–99)
POTASSIUM: 4.4 mmol/L (ref 3.5–5.3)
Sodium: 139 mmol/L (ref 135–146)
Total Protein: 7.4 g/dL (ref 6.1–8.1)

## 2017-07-14 LAB — CBC WITH DIFFERENTIAL/PLATELET
BASOS ABS: 22 {cells}/uL (ref 0–200)
Basophils Relative: 0.5 %
EOS PCT: 3.2 %
Eosinophils Absolute: 141 cells/uL (ref 15–500)
HCT: 27 % — ABNORMAL LOW (ref 35.0–45.0)
HEMOGLOBIN: 8.6 g/dL — AB (ref 11.7–15.5)
Lymphs Abs: 1188 cells/uL (ref 850–3900)
MCH: 26.4 pg — ABNORMAL LOW (ref 27.0–33.0)
MCHC: 31.9 g/dL — AB (ref 32.0–36.0)
MCV: 82.8 fL (ref 80.0–100.0)
MONOS PCT: 9.2 %
MPV: 12.4 fL (ref 7.5–12.5)
NEUTROS ABS: 2644 {cells}/uL (ref 1500–7800)
Neutrophils Relative %: 60.1 %
Platelets: 247 10*3/uL (ref 140–400)
RBC: 3.26 10*6/uL — ABNORMAL LOW (ref 3.80–5.10)
RDW: 16 % — ABNORMAL HIGH (ref 11.0–15.0)
Total Lymphocyte: 27 %
WBC mixed population: 405 cells/uL (ref 200–950)
WBC: 4.4 10*3/uL (ref 3.8–10.8)

## 2017-07-14 LAB — LIPID PANEL
CHOL/HDL RATIO: 3.4 (calc) (ref <5.0)
CHOLESTEROL: 135 mg/dL (ref <200)
HDL: 40 mg/dL — AB (ref >50)
LDL Cholesterol (Calc): 78 mg/dL (calc)
Non-HDL Cholesterol (Calc): 95 mg/dL (calc) (ref <130)
Triglycerides: 90 mg/dL (ref <150)

## 2017-07-14 LAB — TEST AUTHORIZATION

## 2017-07-14 LAB — IRON,TIBC AND FERRITIN PANEL
%SAT: 20 % (calc) (ref 11–50)
Ferritin: 169 ng/mL (ref 20–288)
IRON: 58 ug/dL (ref 45–160)
TIBC: 292 ug/dL (ref 250–450)

## 2017-07-14 LAB — TSH: TSH: 1.29 mIU/L (ref 0.40–4.50)

## 2017-07-14 LAB — VITAMIN D 25 HYDROXY (VIT D DEFICIENCY, FRACTURES): VIT D 25 HYDROXY: 22 ng/mL — AB (ref 30–100)

## 2017-07-16 ENCOUNTER — Other Ambulatory Visit: Payer: Self-pay | Admitting: Family Medicine

## 2017-07-16 DIAGNOSIS — M7662 Achilles tendinitis, left leg: Secondary | ICD-10-CM

## 2017-07-16 NOTE — Telephone Encounter (Signed)
Refill request for general medication: Meloxicam 15 mg  Last office visit: 07/11/17  Last physical exam: 10/04/16  Follow-up on file. 10/23/17

## 2017-10-17 ENCOUNTER — Ambulatory Visit
Admission: RE | Admit: 2017-10-17 | Discharge: 2017-10-17 | Disposition: A | Discharge status: Home / Self Care | Payer: BLUE CROSS/BLUE SHIELD | Source: Ambulatory Visit | Attending: Family Medicine | Admitting: Family Medicine

## 2017-10-17 DIAGNOSIS — Z1239 Encounter for other screening for malignant neoplasm of breast: Secondary | ICD-10-CM

## 2017-10-17 DIAGNOSIS — Z1231 Encounter for screening mammogram for malignant neoplasm of breast: Secondary | ICD-10-CM | POA: Insufficient documentation

## 2017-10-23 ENCOUNTER — Encounter: Payer: Self-pay | Admitting: Family Medicine

## 2017-10-23 ENCOUNTER — Ambulatory Visit (INDEPENDENT_AMBULATORY_CARE_PROVIDER_SITE_OTHER): Payer: BLUE CROSS/BLUE SHIELD | Admitting: Family Medicine

## 2017-10-23 VITALS — BP 118/74 | HR 103 | Temp 97.7°F | Resp 16 | Ht 67.0 in | Wt 280.8 lb

## 2017-10-23 DIAGNOSIS — J3089 Other allergic rhinitis: Secondary | ICD-10-CM

## 2017-10-23 DIAGNOSIS — I10 Essential (primary) hypertension: Secondary | ICD-10-CM | POA: Diagnosis not present

## 2017-10-23 DIAGNOSIS — E890 Postprocedural hypothyroidism: Secondary | ICD-10-CM

## 2017-10-23 DIAGNOSIS — E1129 Type 2 diabetes mellitus with other diabetic kidney complication: Secondary | ICD-10-CM

## 2017-10-23 DIAGNOSIS — D649 Anemia, unspecified: Secondary | ICD-10-CM

## 2017-10-23 DIAGNOSIS — R809 Proteinuria, unspecified: Secondary | ICD-10-CM | POA: Diagnosis not present

## 2017-10-23 DIAGNOSIS — E1169 Type 2 diabetes mellitus with other specified complication: Secondary | ICD-10-CM | POA: Diagnosis not present

## 2017-10-23 DIAGNOSIS — Z124 Encounter for screening for malignant neoplasm of cervix: Secondary | ICD-10-CM

## 2017-10-23 DIAGNOSIS — Z01419 Encounter for gynecological examination (general) (routine) without abnormal findings: Secondary | ICD-10-CM | POA: Diagnosis not present

## 2017-10-23 DIAGNOSIS — D638 Anemia in other chronic diseases classified elsewhere: Secondary | ICD-10-CM | POA: Insufficient documentation

## 2017-10-23 DIAGNOSIS — E785 Hyperlipidemia, unspecified: Secondary | ICD-10-CM | POA: Diagnosis not present

## 2017-10-23 LAB — CBC WITH DIFFERENTIAL/PLATELET
BASOS PCT: 0.4 %
Basophils Absolute: 20 cells/uL (ref 0–200)
EOS ABS: 138 {cells}/uL (ref 15–500)
Eosinophils Relative: 2.7 %
HEMATOCRIT: 28.3 % — AB (ref 35.0–45.0)
Hemoglobin: 8.9 g/dL — ABNORMAL LOW (ref 11.7–15.5)
LYMPHS ABS: 1316 {cells}/uL (ref 850–3900)
MCH: 25.6 pg — AB (ref 27.0–33.0)
MCHC: 31.4 g/dL — ABNORMAL LOW (ref 32.0–36.0)
MCV: 81.3 fL (ref 80.0–100.0)
MPV: 12.3 fL (ref 7.5–12.5)
Monocytes Relative: 7 %
NEUTROS PCT: 64.1 %
Neutro Abs: 3269 cells/uL (ref 1500–7800)
PLATELETS: 210 10*3/uL (ref 140–400)
RBC: 3.48 10*6/uL — ABNORMAL LOW (ref 3.80–5.10)
RDW: 16.2 % — ABNORMAL HIGH (ref 11.0–15.0)
TOTAL LYMPHOCYTE: 25.8 %
WBC: 5.1 10*3/uL (ref 3.8–10.8)
WBCMIX: 357 {cells}/uL (ref 200–950)

## 2017-10-23 MED ORDER — LEVOTHYROXINE SODIUM 175 MCG PO TABS: 175.0000 ug | ORAL_TABLET | Freq: Every day | ORAL | 1 refills | Status: DC

## 2017-10-23 MED ORDER — IRBESARTAN-HYDROCHLOROTHIAZIDE 150-12.5 MG PO TABS: 1.0000 | ORAL_TABLET | Freq: Every day | ORAL | 1 refills | Status: DC

## 2017-10-23 MED ORDER — FLUTICASONE PROPIONATE 50 MCG/ACT NA SUSP: 2.0000 | Freq: Every day | NASAL | 6 refills | Status: DC

## 2017-10-23 MED ORDER — ROSUVASTATIN CALCIUM 5 MG PO TABS: 5.0000 mg | ORAL_TABLET | Freq: Every day | ORAL | 1 refills | Status: DC

## 2017-10-23 MED ORDER — LORATADINE 10 MG PO TABS: 10.0000 mg | ORAL_TABLET | Freq: Every day | ORAL | 0 refills | Status: DC

## 2017-10-23 NOTE — Patient Instructions (Signed)
Preventive Care 40-64 Years, Female Preventive care refers to lifestyle choices and visits with your health care provider that can promote health and wellness. What does preventive care include?  A yearly physical exam. This is also called an annual well check.  Dental exams once or twice a year.  Routine eye exams. Ask your health care provider how often you should have your eyes checked.  Personal lifestyle choices, including: ? Daily care of your teeth and gums. ? Regular physical activity. ? Eating a healthy diet. ? Avoiding tobacco and drug use. ? Limiting alcohol use. ? Practicing safe sex. ? Taking low-dose aspirin daily starting at age 58. ? Taking vitamin and mineral supplements as recommended by your health care provider. What happens during an annual well check? The services and screenings done by your health care provider during your annual well check will depend on your age, overall health, lifestyle risk factors, and family history of disease. Counseling Your health care provider may ask you questions about your:  Alcohol use.  Tobacco use.  Drug use.  Emotional well-being.  Home and relationship well-being.  Sexual activity.  Eating habits.  Work and work Statistician.  Method of birth control.  Menstrual cycle.  Pregnancy history.  Screening You may have the following tests or measurements:  Height, weight, and BMI.  Blood pressure.  Lipid and cholesterol levels. These may be checked every 5 years, or more frequently if you are over 81 years old.  Skin check.  Lung cancer screening. You may have this screening every year starting at age 78 if you have a 30-pack-year history of smoking and currently smoke or have quit within the past 15 years.  Fecal occult blood test (FOBT) of the stool. You may have this test every year starting at age 65.  Flexible sigmoidoscopy or colonoscopy. You may have a sigmoidoscopy every 5 years or a colonoscopy  every 10 years starting at age 30.  Hepatitis C blood test.  Hepatitis B blood test.  Sexually transmitted disease (STD) testing.  Diabetes screening. This is done by checking your blood sugar (glucose) after you have not eaten for a while (fasting). You may have this done every 1-3 years.  Mammogram. This may be done every 1-2 years. Talk to your health care provider about when you should start having regular mammograms. This may depend on whether you have a family history of breast cancer.  BRCA-related cancer screening. This may be done if you have a family history of breast, ovarian, tubal, or peritoneal cancers.  Pelvic exam and Pap test. This may be done every 3 years starting at age 80. Starting at age 36, this may be done every 5 years if you have a Pap test in combination with an HPV test.  Bone density scan. This is done to screen for osteoporosis. You may have this scan if you are at high risk for osteoporosis.  Discuss your test results, treatment options, and if necessary, the need for more tests with your health care provider. Vaccines Your health care provider may recommend certain vaccines, such as:  Influenza vaccine. This is recommended every year.  Tetanus, diphtheria, and acellular pertussis (Tdap, Td) vaccine. You may need a Td booster every 10 years.  Varicella vaccine. You may need this if you have not been vaccinated.  Zoster vaccine. You may need this after age 5.  Measles, mumps, and rubella (MMR) vaccine. You may need at least one dose of MMR if you were born in  1955 or later. You may also need a second dose.  Pneumococcal 13-valent conjugate (PCV13) vaccine. You may need this if you have certain conditions and were not previously vaccinated.  Pneumococcal polysaccharide (PPSV23) vaccine. You may need one or two doses if you smoke cigarettes or if you have certain conditions.  Meningococcal vaccine. You may need this if you have certain  conditions.  Hepatitis A vaccine. You may need this if you have certain conditions or if you travel or work in places where you may be exposed to hepatitis A.  Hepatitis B vaccine. You may need this if you have certain conditions or if you travel or work in places where you may be exposed to hepatitis B.  Haemophilus influenzae type b (Hib) vaccine. You may need this if you have certain conditions.  Talk to your health care provider about which screenings and vaccines you need and how often you need them. This information is not intended to replace advice given to you by your health care provider. Make sure you discuss any questions you have with your health care provider. Document Released: 07/07/2015 Document Revised: 02/28/2016 Document Reviewed: 04/11/2015 Elsevier Interactive Patient Education  2018 [employer]  
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
## 2017-10-24 LAB — HEMOGLOBIN A1C
Hgb A1c MFr Bld: 5.4 %{Hb}
Mean Plasma Glucose: 108 (calc)
eAG (mmol/L): 6 (calc)

## 2017-10-24 LAB — PAP IG AND HPV HIGH-RISK: HPV DNA High Risk: NOT DETECTED

## 2017-10-24 LAB — B12 AND FOLATE PANEL
Folate: 14.3 ng/mL
Vitamin B-12: 472 pg/mL (ref 200–1100)

## 2017-11-13 DIAGNOSIS — H18422 Band keratopathy, left eye: Secondary | ICD-10-CM | POA: Diagnosis not present

## 2017-11-13 LAB — HM DIABETES EYE EXAM

## 2017-11-21 ENCOUNTER — Encounter: Payer: Self-pay | Admitting: Family Medicine

## 2018-01-08 ENCOUNTER — Encounter: Payer: Self-pay | Admitting: Family Medicine

## 2018-01-08 ENCOUNTER — Ambulatory Visit (INDEPENDENT_AMBULATORY_CARE_PROVIDER_SITE_OTHER): Payer: Medicare Other | Admitting: Family Medicine

## 2018-01-08 VITALS — BP 118/72 | HR 100 | Temp 98.0°F | Resp 16 | Ht 67.0 in | Wt 281.7 lb

## 2018-01-08 DIAGNOSIS — I10 Essential (primary) hypertension: Secondary | ICD-10-CM

## 2018-01-08 DIAGNOSIS — R809 Proteinuria, unspecified: Secondary | ICD-10-CM | POA: Diagnosis not present

## 2018-01-08 DIAGNOSIS — D649 Anemia, unspecified: Secondary | ICD-10-CM

## 2018-01-08 DIAGNOSIS — E2839 Other primary ovarian failure: Secondary | ICD-10-CM

## 2018-01-08 DIAGNOSIS — E559 Vitamin D deficiency, unspecified: Secondary | ICD-10-CM | POA: Diagnosis not present

## 2018-01-08 DIAGNOSIS — E1129 Type 2 diabetes mellitus with other diabetic kidney complication: Secondary | ICD-10-CM | POA: Diagnosis not present

## 2018-01-08 DIAGNOSIS — E032 Hypothyroidism due to medicaments and other exogenous substances: Secondary | ICD-10-CM

## 2018-01-08 DIAGNOSIS — Z23 Encounter for immunization: Secondary | ICD-10-CM | POA: Diagnosis not present

## 2018-01-08 DIAGNOSIS — E1169 Type 2 diabetes mellitus with other specified complication: Secondary | ICD-10-CM

## 2018-01-08 DIAGNOSIS — E785 Hyperlipidemia, unspecified: Secondary | ICD-10-CM | POA: Diagnosis not present
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
## 2018-01-10 ENCOUNTER — Other Ambulatory Visit: Payer: Self-pay | Admitting: Family Medicine

## 2018-01-10 DIAGNOSIS — D649 Anemia, unspecified: Secondary | ICD-10-CM

## 2018-01-12 ENCOUNTER — Other Ambulatory Visit: Payer: Self-pay

## 2018-01-12 DIAGNOSIS — D649 Anemia, unspecified: Secondary | ICD-10-CM

## 2018-01-13 LAB — COMPLETE METABOLIC PANEL WITH GFR
AG RATIO: 0.9 (calc) — AB (ref 1.0–2.5)
ALKALINE PHOSPHATASE (APISO): 108 U/L (ref 33–130)
ALT: 15 U/L (ref 6–29)
AST: 20 U/L (ref 10–35)
Albumin: 3.7 g/dL (ref 3.6–5.1)
BUN: 15 mg/dL (ref 7–25)
CO2: 27 mmol/L (ref 20–32)
Calcium: 9.1 mg/dL (ref 8.6–10.4)
Chloride: 103 mmol/L (ref 98–110)
Creat: 0.82 mg/dL (ref 0.50–0.99)
GFR, Est African American: 87 mL/min/{1.73_m2} (ref >=60)
GFR, Est Non African American: 75 mL/min/{1.73_m2} (ref >=60)
GLOBULIN: 4.1 g/dL — AB (ref 1.9–3.7)
Glucose, Bld: 105 mg/dL — ABNORMAL HIGH (ref 65–99)
POTASSIUM: 4.1 mmol/L (ref 3.5–5.3)
SODIUM: 138 mmol/L (ref 135–146)
Total Bilirubin: 0.4 mg/dL (ref 0.2–1.2)
Total Protein: 7.8 g/dL (ref 6.1–8.1)

## 2018-01-13 LAB — IRON,TIBC AND FERRITIN PANEL
%SAT: 21 % (calc) (ref 16–45)
Ferritin: 168 ng/mL (ref 16–288)
IRON: 58 ug/dL (ref 45–160)
TIBC: 277 mcg/dL (calc) (ref 250–450)

## 2018-01-13 LAB — CBC WITH DIFFERENTIAL/PLATELET
BASOS ABS: 9 {cells}/uL (ref 0–200)
Basophils Relative: 0.2 %
EOS PCT: 3.2 %
Eosinophils Absolute: 147 cells/uL (ref 15–500)
HEMATOCRIT: 28.8 % — AB (ref 35.0–45.0)
Hemoglobin: 8.9 g/dL — ABNORMAL LOW (ref 11.7–15.5)
LYMPHS ABS: 1329 {cells}/uL (ref 850–3900)
MCH: 25.1 pg — ABNORMAL LOW (ref 27.0–33.0)
MCHC: 30.9 g/dL — AB (ref 32.0–36.0)
MCV: 81.4 fL (ref 80.0–100.0)
MPV: 12 fL (ref 7.5–12.5)
Monocytes Relative: 7.8 %
NEUTROS PCT: 59.9 %
Neutro Abs: 2755 cells/uL (ref 1500–7800)
PLATELETS: 274 10*3/uL (ref 140–400)
RBC: 3.54 10*6/uL — AB (ref 3.80–5.10)
RDW: 16 % — AB (ref 11.0–15.0)
TOTAL LYMPHOCYTE: 28.9 %
WBC mixed population: 359 cells/uL (ref 200–950)
WBC: 4.6 10*3/uL (ref 3.8–10.8)

## 2018-01-13 LAB — TEST AUTHORIZATION

## 2018-01-13 LAB — VITAMIN D 25 HYDROXY (VIT D DEFICIENCY, FRACTURES): Vit D, 25-Hydroxy: 25 ng/mL — ABNORMAL LOW (ref 30–100)

## 2018-01-13 LAB — TSH: TSH: 1.09 mIU/L (ref 0.40–4.50)

## 2018-01-14 ENCOUNTER — Inpatient Hospital Stay: Payer: Medicare Other

## 2018-01-14 ENCOUNTER — Other Ambulatory Visit: Payer: Self-pay

## 2018-01-14 ENCOUNTER — Encounter: Payer: Self-pay | Admitting: Oncology

## 2018-01-14 ENCOUNTER — Inpatient Hospital Stay: Payer: Medicare Other | Attending: Oncology | Admitting: Oncology

## 2018-01-14 VITALS — BP 112/70 | HR 83 | Temp 97.9°F | Resp 18 | Wt 284.4 lb

## 2018-01-14 DIAGNOSIS — Z803 Family history of malignant neoplasm of breast: Secondary | ICD-10-CM | POA: Diagnosis not present

## 2018-01-14 DIAGNOSIS — E785 Hyperlipidemia, unspecified: Secondary | ICD-10-CM | POA: Insufficient documentation

## 2018-01-14 DIAGNOSIS — D649 Anemia, unspecified: Secondary | ICD-10-CM

## 2018-01-14 DIAGNOSIS — E559 Vitamin D deficiency, unspecified: Secondary | ICD-10-CM | POA: Diagnosis not present

## 2018-01-14 DIAGNOSIS — Z923 Personal history of irradiation: Secondary | ICD-10-CM

## 2018-01-14 DIAGNOSIS — Z79899 Other long term (current) drug therapy: Secondary | ICD-10-CM | POA: Insufficient documentation

## 2018-01-14 DIAGNOSIS — I10 Essential (primary) hypertension: Secondary | ICD-10-CM | POA: Diagnosis not present

## 2018-01-14 DIAGNOSIS — Z808 Family history of malignant neoplasm of other organs or systems: Secondary | ICD-10-CM | POA: Insufficient documentation

## 2018-01-14 DIAGNOSIS — Z7982 Long term (current) use of aspirin: Secondary | ICD-10-CM | POA: Diagnosis not present

## 2018-01-14 DIAGNOSIS — E119 Type 2 diabetes mellitus without complications: Secondary | ICD-10-CM | POA: Insufficient documentation

## 2018-01-14 DIAGNOSIS — Z6841 Body Mass Index (BMI) 40.0 and over, adult: Secondary | ICD-10-CM | POA: Insufficient documentation

## 2018-01-14 DIAGNOSIS — R531 Weakness: Secondary | ICD-10-CM | POA: Diagnosis not present

## 2018-01-14 DIAGNOSIS — L409 Psoriasis, unspecified: Secondary | ICD-10-CM | POA: Diagnosis not present

## 2018-01-14 DIAGNOSIS — R0602 Shortness of breath: Secondary | ICD-10-CM | POA: Diagnosis not present

## 2018-01-14 DIAGNOSIS — E039 Hypothyroidism, unspecified: Secondary | ICD-10-CM | POA: Diagnosis not present

## 2018-01-14 DIAGNOSIS — R5383 Other fatigue: Secondary | ICD-10-CM | POA: Insufficient documentation

## 2018-01-14 LAB — CBC WITH DIFFERENTIAL/PLATELET
BASOS PCT: 0 %
Basophils Absolute: 0 10*3/uL (ref 0–0.1)
Eosinophils Absolute: 0.2 10*3/uL (ref 0–0.7)
Eosinophils Relative: 5 %
HEMATOCRIT: 27.1 % — AB (ref 35.0–47.0)
HEMOGLOBIN: 8.7 g/dL — AB (ref 12.0–16.0)
LYMPHS PCT: 25 %
Lymphs Abs: 1.2 10*3/uL (ref 1.0–3.6)
MCH: 25.9 pg — ABNORMAL LOW (ref 26.0–34.0)
MCHC: 32.3 g/dL (ref 32.0–36.0)
MCV: 80.1 fL (ref 80.0–100.0)
Monocytes Absolute: 0.4 10*3/uL (ref 0.2–0.9)
Monocytes Relative: 9 %
NEUTROS ABS: 2.9 10*3/uL (ref 1.4–6.5)
NEUTROS PCT: 61 %
Platelets: 268 10*3/uL (ref 150–440)
RBC: 3.38 MIL/uL — AB (ref 3.80–5.20)
RDW: 17.8 % — ABNORMAL HIGH (ref 11.5–14.5)
WBC: 4.8 10*3/uL (ref 3.6–11.0)

## 2018-01-14 LAB — C-REACTIVE PROTEIN: CRP: 4.8 mg/dL — ABNORMAL HIGH (ref <1.0)

## 2018-01-14 LAB — RETICULOCYTES
RBC.: 3.41 MIL/uL — ABNORMAL LOW (ref 3.80–5.20)
RETIC COUNT ABSOLUTE: 71.6 10*3/uL (ref 19.0–183.0)
Retic Ct Pct: 2.1 % (ref 0.4–3.1)

## 2018-01-14 LAB — TECHNOLOGIST SMEAR REVIEW: TECH REVIEW: ADEQUATE

## 2018-01-14 LAB — SEDIMENTATION RATE: Sed Rate: 128 mm/hr — ABNORMAL HIGH (ref 0–30)

## 2018-01-14 NOTE — Progress Notes (Signed)
New patient visit for anemia, reports feeling "more fatigued".
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
## 2018-01-15 LAB — KAPPA/LAMBDA LIGHT CHAINS
KAPPA FREE LGHT CHN: 88.8 mg/L — AB (ref 3.3–19.4)
Kappa, lambda light chain ratio: 1.82 — ABNORMAL HIGH (ref 0.26–1.65)
LAMDA FREE LIGHT CHAINS: 48.7 mg/L — AB (ref 5.7–26.3)

## 2018-01-15 LAB — ANTINUCLEAR ANTIBODIES, IFA: ANTINUCLEAR ANTIBODIES, IFA: NEGATIVE

## 2018-01-16 LAB — MULTIPLE MYELOMA PANEL, SERUM
ALBUMIN/GLOB SERPL: 0.7 (ref 0.7–1.7)
Albumin SerPl Elph-Mcnc: 3.1 g/dL (ref 2.9–4.4)
Alpha 1: 0.3 g/dL (ref 0.0–0.4)
Alpha2 Glob SerPl Elph-Mcnc: 0.7 g/dL (ref 0.4–1.0)
B-Globulin SerPl Elph-Mcnc: 1.3 g/dL (ref 0.7–1.3)
Gamma Glob SerPl Elph-Mcnc: 2.1 g/dL — ABNORMAL HIGH (ref 0.4–1.8)
Globulin, Total: 4.5 g/dL — ABNORMAL HIGH (ref 2.2–3.9)
IGM (IMMUNOGLOBULIN M), SRM: 101 mg/dL (ref 26–217)
IgA: 559 mg/dL — ABNORMAL HIGH (ref 87–352)
IgG (Immunoglobin G), Serum: 2420 mg/dL — ABNORMAL HIGH (ref 700–1600)
Total Protein ELP: 7.6 g/dL (ref 6.0–8.5)

## 2018-01-28 ENCOUNTER — Other Ambulatory Visit: Payer: Self-pay

## 2018-01-28 ENCOUNTER — Inpatient Hospital Stay: Payer: Medicare Other | Attending: Oncology | Admitting: Oncology

## 2018-01-28 ENCOUNTER — Encounter: Payer: Self-pay | Admitting: Oncology

## 2018-01-28 VITALS — BP 143/84 | HR 101 | Temp 96.0°F | Wt 283.5 lb

## 2018-01-28 DIAGNOSIS — E559 Vitamin D deficiency, unspecified: Secondary | ICD-10-CM

## 2018-01-28 DIAGNOSIS — E119 Type 2 diabetes mellitus without complications: Secondary | ICD-10-CM | POA: Diagnosis not present

## 2018-01-28 DIAGNOSIS — E039 Hypothyroidism, unspecified: Secondary | ICD-10-CM | POA: Diagnosis not present

## 2018-01-28 DIAGNOSIS — Z7982 Long term (current) use of aspirin: Secondary | ICD-10-CM

## 2018-01-28 DIAGNOSIS — D649 Anemia, unspecified: Secondary | ICD-10-CM | POA: Diagnosis not present

## 2018-01-28 DIAGNOSIS — Z7901 Long term (current) use of anticoagulants: Secondary | ICD-10-CM | POA: Diagnosis not present

## 2018-01-28 DIAGNOSIS — Z6841 Body Mass Index (BMI) 40.0 and over, adult: Secondary | ICD-10-CM | POA: Diagnosis not present

## 2018-01-28 DIAGNOSIS — R5383 Other fatigue: Secondary | ICD-10-CM | POA: Diagnosis not present

## 2018-01-28 DIAGNOSIS — E785 Hyperlipidemia, unspecified: Secondary | ICD-10-CM | POA: Diagnosis not present

## 2018-01-28 DIAGNOSIS — I10 Essential (primary) hypertension: Secondary | ICD-10-CM

## 2018-01-28 DIAGNOSIS — R0602 Shortness of breath: Secondary | ICD-10-CM | POA: Diagnosis not present

## 2018-01-28 NOTE — Progress Notes (Signed)
Patient here today for follow up.   
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
## 2018-02-02 ENCOUNTER — Other Ambulatory Visit: Payer: Self-pay | Admitting: Family Medicine

## 2018-02-02 ENCOUNTER — Other Ambulatory Visit: Payer: Self-pay

## 2018-02-02 DIAGNOSIS — D649 Anemia, unspecified: Secondary | ICD-10-CM

## 2018-02-02 DIAGNOSIS — L409 Psoriasis, unspecified: Secondary | ICD-10-CM

## 2018-02-02 LAB — POC HEMOCCULT BLD/STL (HOME/3-CARD/SCREEN)
Card #2 Fecal Occult Blod, POC: NEGATIVE
FECAL OCCULT BLD: NEGATIVE
Fecal Occult Blood, POC: NEGATIVE

## 2018-02-17 DIAGNOSIS — M2550 Pain in unspecified joint: Secondary | ICD-10-CM | POA: Insufficient documentation

## 2018-02-17 DIAGNOSIS — M19041 Primary osteoarthritis, right hand: Secondary | ICD-10-CM | POA: Diagnosis not present

## 2018-02-17 DIAGNOSIS — M19042 Primary osteoarthritis, left hand: Secondary | ICD-10-CM | POA: Diagnosis not present

## 2018-02-17 DIAGNOSIS — Z6841 Body Mass Index (BMI) 40.0 and over, adult: Secondary | ICD-10-CM | POA: Diagnosis not present

## 2018-02-17 DIAGNOSIS — M19031 Primary osteoarthritis, right wrist: Secondary | ICD-10-CM | POA: Diagnosis not present

## 2018-02-17 DIAGNOSIS — D638 Anemia in other chronic diseases classified elsewhere: Secondary | ICD-10-CM | POA: Diagnosis not present

## 2018-02-17 DIAGNOSIS — M170 Bilateral primary osteoarthritis of knee: Secondary | ICD-10-CM | POA: Diagnosis not present

## 2018-02-17 DIAGNOSIS — L409 Psoriasis, unspecified: Secondary | ICD-10-CM | POA: Diagnosis not present

## 2018-02-17 DIAGNOSIS — R079 Chest pain, unspecified: Secondary | ICD-10-CM | POA: Diagnosis not present

## 2018-02-17 DIAGNOSIS — M19032 Primary osteoarthritis, left wrist: Secondary | ICD-10-CM | POA: Diagnosis not present

## 2018-03-02 ENCOUNTER — Other Ambulatory Visit: Payer: Self-pay | Admitting: Family Medicine

## 2018-03-02 DIAGNOSIS — I10 Essential (primary) hypertension: Secondary | ICD-10-CM

## 2018-03-02 DIAGNOSIS — E890 Postprocedural hypothyroidism: Secondary | ICD-10-CM

## 2018-03-02 DIAGNOSIS — R809 Proteinuria, unspecified: Secondary | ICD-10-CM

## 2018-03-02 DIAGNOSIS — E1129 Type 2 diabetes mellitus with other diabetic kidney complication: Secondary | ICD-10-CM

## 2018-03-02 NOTE — Telephone Encounter (Signed)
Copied from [place] [id number]. Topic: Quick Communication - Rx Refill/Question >> Mar 02, 2018  8:38 AM Marval Regal L wrote: Medication: irbesartan-hydrochlorothiazide (AVALIDE) 150-12.5 MG tablet [[id number]] levothyroxine (SYNTHROID, LEVOTHROID) 175 MCG tablet [[id number]]  Has the patient contacted their pharmacy? yes   Preferred Pharmacy (with phone number or street name):[facility] - [place], [place] - [address] [phone] (Phone) [phone] (Fax)   Agent: Please be advised that RX refills may take up to 3 business days. We ask that you follow-up with your pharmacy.

## 2018-03-03 ENCOUNTER — Other Ambulatory Visit: Payer: PRIVATE HEALTH INSURANCE

## 2018-03-04 DIAGNOSIS — Z79899 Other long term (current) drug therapy: Secondary | ICD-10-CM | POA: Diagnosis not present

## 2018-03-04 DIAGNOSIS — M170 Bilateral primary osteoarthritis of knee: Secondary | ICD-10-CM | POA: Diagnosis not present

## 2018-03-04 DIAGNOSIS — Z6841 Body Mass Index (BMI) 40.0 and over, adult: Secondary | ICD-10-CM | POA: Diagnosis not present

## 2018-03-04 DIAGNOSIS — L409 Psoriasis, unspecified: Secondary | ICD-10-CM | POA: Diagnosis not present

## 2018-03-04 DIAGNOSIS — M1990 Unspecified osteoarthritis, unspecified site: Secondary | ICD-10-CM | POA: Insufficient documentation

## 2018-03-11 DIAGNOSIS — G8929 Other chronic pain: Secondary | ICD-10-CM | POA: Diagnosis not present

## 2018-03-11 DIAGNOSIS — M25561 Pain in right knee: Secondary | ICD-10-CM | POA: Diagnosis not present

## 2018-03-11 DIAGNOSIS — M1732 Unilateral post-traumatic osteoarthritis, left knee: Secondary | ICD-10-CM | POA: Diagnosis not present

## 2018-03-11 DIAGNOSIS — M25562 Pain in left knee: Secondary | ICD-10-CM | POA: Diagnosis not present

## 2018-03-11 DIAGNOSIS — M175 Other unilateral secondary osteoarthritis of knee: Secondary | ICD-10-CM | POA: Diagnosis not present

## 2018-03-17 ENCOUNTER — Ambulatory Visit: Payer: Medicare Other

## 2018-03-17 ENCOUNTER — Ambulatory Visit (INDEPENDENT_AMBULATORY_CARE_PROVIDER_SITE_OTHER): Payer: Medicare Other | Admitting: Family Medicine

## 2018-03-17 ENCOUNTER — Encounter: Payer: Self-pay | Admitting: Family Medicine

## 2018-03-17 VITALS — BP 118/76 | HR 105 | Temp 98.3°F | Resp 16 | Ht 67.0 in | Wt 272.6 lb

## 2018-03-17 DIAGNOSIS — D638 Anemia in other chronic diseases classified elsewhere: Secondary | ICD-10-CM

## 2018-03-17 DIAGNOSIS — E890 Postprocedural hypothyroidism: Secondary | ICD-10-CM

## 2018-03-17 DIAGNOSIS — L409 Psoriasis, unspecified: Secondary | ICD-10-CM

## 2018-03-17 DIAGNOSIS — E1129 Type 2 diabetes mellitus with other diabetic kidney complication: Secondary | ICD-10-CM | POA: Diagnosis not present

## 2018-03-17 DIAGNOSIS — E785 Hyperlipidemia, unspecified: Secondary | ICD-10-CM | POA: Diagnosis not present

## 2018-03-17 DIAGNOSIS — R809 Proteinuria, unspecified: Secondary | ICD-10-CM

## 2018-03-17 DIAGNOSIS — M1990 Unspecified osteoarthritis, unspecified site: Secondary | ICD-10-CM | POA: Diagnosis not present

## 2018-03-17 DIAGNOSIS — E1169 Type 2 diabetes mellitus with other specified complication: Secondary | ICD-10-CM

## 2018-03-17 DIAGNOSIS — I10 Essential (primary) hypertension: Secondary | ICD-10-CM

## 2018-03-17 DIAGNOSIS — Z23 Encounter for immunization: Secondary | ICD-10-CM | POA: Diagnosis not present

## 2018-03-17 LAB — POCT GLYCOSYLATED HEMOGLOBIN (HGB A1C): HbA1c, POC (controlled diabetic range): 5.5 % (ref 0.0–7.0)

## 2018-03-17 MED ORDER — ROSUVASTATIN CALCIUM 5 MG PO TABS: 5.0000 mg | ORAL_TABLET | Freq: Every day | ORAL | 1 refills | Status: DC

## 2018-03-17 MED ORDER — IRBESARTAN-HYDROCHLOROTHIAZIDE 150-12.5 MG PO TABS: 1.0000 | ORAL_TABLET | Freq: Every day | ORAL | 1 refills | Status: DC

## 2018-03-17 MED ORDER — LEVOTHYROXINE SODIUM 175 MCG PO TABS: 175.0000 ug | ORAL_TABLET | Freq: Every day | ORAL | 1 refills | Status: DC
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
## 2018-03-20 DIAGNOSIS — M1990 Unspecified osteoarthritis, unspecified site: Secondary | ICD-10-CM | POA: Diagnosis not present

## 2018-04-15 DIAGNOSIS — M170 Bilateral primary osteoarthritis of knee: Secondary | ICD-10-CM | POA: Diagnosis not present

## 2018-04-15 DIAGNOSIS — D638 Anemia in other chronic diseases classified elsewhere: Secondary | ICD-10-CM | POA: Diagnosis not present

## 2018-04-15 DIAGNOSIS — Z6841 Body Mass Index (BMI) 40.0 and over, adult: Secondary | ICD-10-CM | POA: Diagnosis not present

## 2018-04-15 DIAGNOSIS — Z79899 Other long term (current) drug therapy: Secondary | ICD-10-CM | POA: Diagnosis not present

## 2018-04-15 DIAGNOSIS — L409 Psoriasis, unspecified: Secondary | ICD-10-CM | POA: Diagnosis not present

## 2018-04-15 DIAGNOSIS — M1990 Unspecified osteoarthritis, unspecified site: Secondary | ICD-10-CM | POA: Diagnosis not present

## 2018-07-16 DIAGNOSIS — D638 Anemia in other chronic diseases classified elsewhere: Secondary | ICD-10-CM | POA: Diagnosis not present

## 2018-07-16 DIAGNOSIS — M059 Rheumatoid arthritis with rheumatoid factor, unspecified: Secondary | ICD-10-CM | POA: Insufficient documentation

## 2018-07-16 DIAGNOSIS — Z6841 Body Mass Index (BMI) 40.0 and over, adult: Secondary | ICD-10-CM | POA: Diagnosis not present

## 2018-07-16 DIAGNOSIS — L4050 Arthropathic psoriasis, unspecified: Secondary | ICD-10-CM | POA: Diagnosis not present

## 2018-07-16 DIAGNOSIS — Z79899 Other long term (current) drug therapy: Secondary | ICD-10-CM | POA: Diagnosis not present

## 2018-07-16 DIAGNOSIS — M170 Bilateral primary osteoarthritis of knee: Secondary | ICD-10-CM | POA: Diagnosis not present

## 2018-07-16 DIAGNOSIS — M1990 Unspecified osteoarthritis, unspecified site: Secondary | ICD-10-CM | POA: Diagnosis not present

## 2018-07-17 ENCOUNTER — Encounter: Payer: Self-pay | Admitting: Family Medicine

## 2018-07-17 ENCOUNTER — Ambulatory Visit (INDEPENDENT_AMBULATORY_CARE_PROVIDER_SITE_OTHER): Payer: Medicare HMO | Admitting: Family Medicine

## 2018-07-17 ENCOUNTER — Ambulatory Visit (INDEPENDENT_AMBULATORY_CARE_PROVIDER_SITE_OTHER): Payer: Medicare HMO

## 2018-07-17 VITALS — BP 110/80 | HR 98 | Temp 97.9°F | Resp 16 | Ht 67.0 in | Wt 287.8 lb

## 2018-07-17 DIAGNOSIS — E1129 Type 2 diabetes mellitus with other diabetic kidney complication: Secondary | ICD-10-CM

## 2018-07-17 DIAGNOSIS — E1169 Type 2 diabetes mellitus with other specified complication: Secondary | ICD-10-CM

## 2018-07-17 DIAGNOSIS — Z1231 Encounter for screening mammogram for malignant neoplasm of breast: Secondary | ICD-10-CM

## 2018-07-17 DIAGNOSIS — D638 Anemia in other chronic diseases classified elsewhere: Secondary | ICD-10-CM

## 2018-07-17 DIAGNOSIS — E032 Hypothyroidism due to medicaments and other exogenous substances: Secondary | ICD-10-CM

## 2018-07-17 DIAGNOSIS — I10 Essential (primary) hypertension: Secondary | ICD-10-CM | POA: Diagnosis not present

## 2018-07-17 DIAGNOSIS — E785 Hyperlipidemia, unspecified: Secondary | ICD-10-CM

## 2018-07-17 DIAGNOSIS — R809 Proteinuria, unspecified: Secondary | ICD-10-CM | POA: Diagnosis not present

## 2018-07-17 DIAGNOSIS — Z23 Encounter for immunization: Secondary | ICD-10-CM | POA: Diagnosis not present

## 2018-07-17 DIAGNOSIS — Z0000 Encounter for general adult medical examination without abnormal findings: Secondary | ICD-10-CM

## 2018-07-17 LAB — POCT GLYCOSYLATED HEMOGLOBIN (HGB A1C): HEMOGLOBIN A1C: 6 % — AB (ref 4.0–5.6)

## 2018-07-17 MED ORDER — IRBESARTAN-HYDROCHLOROTHIAZIDE 150-12.5 MG PO TABS: 1.0000 | ORAL_TABLET | Freq: Every day | ORAL | 1 refills | Status: DC

## 2018-07-17 MED ORDER — ROSUVASTATIN CALCIUM 5 MG PO TABS: 5.0000 mg | ORAL_TABLET | Freq: Every day | ORAL | 1 refills | Status: DC
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
## 2018-07-17 NOTE — Patient Instructions (Signed)
Taylor Perry , Thank you for taking time to come for your Medicare Wellness Visit. I appreciate your ongoing commitment to your health goals. Please review the following plan we discussed and let me know if I can assist you in the future.   Screening recommendations/referrals: Colonoscopy: done 06/14/14 Mammogram: done 10/17/17. Please call [phone] to schedule your mammogram and bone density exam.   Recommended yearly ophthalmology/optometry visit for glaucoma screening and checkup Recommended yearly dental visit for hygiene and checkup  Vaccinations: Influenza vaccine: done 03/17/18 Pneumococcal vaccine: done 01/08/18 Tdap vaccine: done 02/11/12 Shingles vaccine: Shingrix discussed. Please contact your pharmacy for coverage information.     Advanced directives: Advance directive discussed with you today. I have provided a copy for you to complete at home and have notarized. Once this is complete please bring a copy in to our office so we can scan it into your chart.  Conditions/risks identified: Recommend increasing physical activity to at least 3 days per week.   Next appointment: Please follow up in one year for your Medicare Annual Wellness visit.     Preventive Care 6 Years and Older, Female Preventive care refers to lifestyle choices and visits with your health care provider that can promote health and wellness. What does preventive care include?  A yearly physical exam. This is also called an annual well check.  Dental exams once or twice a year.  Routine eye exams. Ask your health care provider how often you should have your eyes checked.  Personal lifestyle choices, including:  Daily care of your teeth and gums.  Regular physical activity.  Eating a healthy diet.  Avoiding tobacco and drug use.  Limiting alcohol use.  Practicing safe sex.  Taking low-dose aspirin every day.  Taking vitamin and mineral supplements as recommended by your health care  provider. What happens during an annual well check? The services and screenings done by your health care provider during your annual well check will depend on your age, overall health, lifestyle risk factors, and family history of disease. Counseling  Your health care provider may ask you questions about your:  Alcohol use.  Tobacco use.  Drug use.  Emotional well-being.  Home and relationship well-being.  Sexual activity.  Eating habits.  History of falls.  Memory and ability to understand (cognition).  Work and work Statistician.  Reproductive health. Screening  You may have the following tests or measurements:  Height, weight, and BMI.  Blood pressure.  Lipid and cholesterol levels. These may be checked every 5 years, or more frequently if you are over 33 years old.  Skin check.  Lung cancer screening. You may have this screening every year starting at age 31 if you have a 30-pack-year history of smoking and currently smoke or have quit within the past 15 years.  Fecal occult blood test (FOBT) of the stool. You may have this test every year starting at age 33.  Flexible sigmoidoscopy or colonoscopy. You may have a sigmoidoscopy every 5 years or a colonoscopy every 10 years starting at age 29.  Hepatitis C blood test.  Hepatitis B blood test.  Sexually transmitted disease (STD) testing.  Diabetes screening. This is done by checking your blood sugar (glucose) after you have not eaten for a while (fasting). You may have this done every 1-3 years.  Bone density scan. This is done to screen for osteoporosis. You may have this done starting at age 54.  Mammogram. This may be done every 1-2 years. Talk to  your health care provider about how often you should have regular mammograms. Talk with your health care provider about your test results, treatment options, and if necessary, the need for more tests. Vaccines  Your health care provider may recommend certain  vaccines, such as:  Influenza vaccine. This is recommended every year.  Tetanus, diphtheria, and acellular pertussis (Tdap, Td) vaccine. You may need a Td booster every 10 years.  Zoster vaccine. You may need this after age 50.  Pneumococcal 13-valent conjugate (PCV13) vaccine. One dose is recommended after age 63.  Pneumococcal polysaccharide (PPSV23) vaccine. One dose is recommended after age 49. Talk to your health care provider about which screenings and vaccines you need and how often you need them. This information is not intended to replace advice given to you by your health care provider. Make sure you discuss any questions you have with your health care provider. Document Released: 07/07/2015 Document Revised: 02/28/2016 Document Reviewed: 04/11/2015 Elsevier Interactive Patient Education  2017 [employer] Prevention in the Home Falls can cause injuries. They can happen to people of all ages. There are many things you can do to make your home safe and to help prevent falls. What can I do on the outside of my home?  Regularly fix the edges of walkways and driveways and fix any cracks.  Remove anything that might make you trip as you walk through a door, such as a raised step or threshold.  Trim any bushes or trees on the path to your home.  Use bright outdoor lighting.  Clear any walking paths of anything that might make someone trip, such as rocks or tools.  Regularly check to see if handrails are loose or broken. Make sure that both sides of any steps have handrails.  Any raised decks and porches should have guardrails on the edges.  Have any leaves, snow, or ice cleared regularly.  Use sand or salt on walking paths during winter.  Clean up any spills in your garage right away. This includes oil or grease spills. What can I do in the bathroom?  Use night lights.  Install grab bars by the toilet and in the tub and shower. Do not use towel bars as grab  bars.  Use non-skid mats or decals in the tub or shower.  If you need to sit down in the shower, use a plastic, non-slip stool.  Keep the floor dry. Clean up any water that spills on the floor as soon as it happens.  Remove soap buildup in the tub or shower regularly.  Attach bath mats securely with double-sided non-slip rug tape.  Do not have throw rugs and other things on the floor that can make you trip. What can I do in the bedroom?  Use night lights.  Make sure that you have a light by your bed that is easy to reach.  Do not use any sheets or blankets that are too big for your bed. They should not hang down onto the floor.  Have a firm chair that has side arms. You can use this for support while you get dressed.  Do not have throw rugs and other things on the floor that can make you trip. What can I do in the kitchen?  Clean up any spills right away.  Avoid walking on wet floors.  Keep items that you use a lot in easy-to-reach places.  If you need to reach something above you, use a strong step stool that has  a grab bar.  Keep electrical cords out of the way.  Do not use floor polish or wax that makes floors slippery. If you must use wax, use non-skid floor wax.  Do not have throw rugs and other things on the floor that can make you trip. What can I do with my stairs?  Do not leave any items on the stairs.  Make sure that there are handrails on both sides of the stairs and use them. Fix handrails that are broken or loose. Make sure that handrails are as long as the stairways.  Check any carpeting to make sure that it is firmly attached to the stairs. Fix any carpet that is loose or worn.  Avoid having throw rugs at the top or bottom of the stairs. If you do have throw rugs, attach them to the floor with carpet tape.  Make sure that you have a light switch at the top of the stairs and the bottom of the stairs. If you do not have them, ask someone to add them for  you. What else can I do to help prevent falls?  Wear shoes that:  Do not have high heels.  Have rubber bottoms.  Are comfortable and fit you well.  Are closed at the toe. Do not wear sandals.  If you use a stepladder:  Make sure that it is fully opened. Do not climb a closed stepladder.  Make sure that both sides of the stepladder are locked into place.  Ask someone to hold it for you, if possible.  Clearly mark and make sure that you can see:  Any grab bars or handrails.  First and last steps.  Where the edge of each step is.  Use tools that help you move around (mobility aids) if they are needed. These include:  Canes.  Walkers.  Scooters.  Crutches.  Turn on the lights when you go into a dark area. Replace any light bulbs as soon as they burn out.  Set up your furniture so you have a clear path. Avoid moving your furniture around.  If any of your floors are uneven, fix them.  If there are any pets around you, be aware of where they are.  Review your medicines with your doctor. Some medicines can make you feel dizzy. This can increase your chance of falling. Ask your doctor what other things that you can do to help prevent falls. This information is not intended to replace advice given to you by your health care provider. Make sure you discuss any questions you have with your health care provider. Document Released: 04/06/2009 Document Revised: 11/16/2015 Document Reviewed: 07/15/2014 Elsevier Interactive Patient Education  2017 [employer].

## 2018-07-18 LAB — MICROALBUMIN / CREATININE URINE RATIO
Creatinine, Urine: 88 mg/dL (ref 20–275)
Microalb Creat Ratio: 6 mcg/mg creat (ref <30)
Microalb, Ur: 0.5 mg/dL

## 2018-07-18 LAB — LIPID PANEL
Cholesterol: 109 mg/dL (ref <200)
HDL: 44 mg/dL — ABNORMAL LOW (ref >50)
LDL Cholesterol (Calc): 47 mg/dL (calc)
Non-HDL Cholesterol (Calc): 65 mg/dL (calc) (ref <130)
Total CHOL/HDL Ratio: 2.5 (calc) (ref <5.0)
Triglycerides: 96 mg/dL (ref <150)

## 2018-07-18 LAB — TSH: TSH: 0.53 mIU/L (ref 0.40–4.50)

## 2018-07-19 ENCOUNTER — Other Ambulatory Visit: Payer: Self-pay | Admitting: Family Medicine

## 2018-07-19 DIAGNOSIS — E890 Postprocedural hypothyroidism: Secondary | ICD-10-CM

## 2018-07-19 MED ORDER — LEVOTHYROXINE SODIUM 175 MCG PO TABS: 175.0000 ug | ORAL_TABLET | Freq: Every day | ORAL | 1 refills | Status: DC

## 2018-08-21 ENCOUNTER — Ambulatory Visit (INDEPENDENT_AMBULATORY_CARE_PROVIDER_SITE_OTHER): Payer: Medicare HMO

## 2018-08-21 DIAGNOSIS — Z23 Encounter for immunization: Secondary | ICD-10-CM

## 2018-08-21 DIAGNOSIS — Z1159 Encounter for screening for other viral diseases: Secondary | ICD-10-CM

## 2018-11-13 ENCOUNTER — Encounter: Payer: Self-pay | Admitting: Family Medicine

## 2018-11-13 DIAGNOSIS — L4050 Arthropathic psoriasis, unspecified: Secondary | ICD-10-CM | POA: Diagnosis not present

## 2018-11-17 ENCOUNTER — Encounter: Payer: Self-pay | Admitting: Family Medicine

## 2018-11-17 ENCOUNTER — Ambulatory Visit (INDEPENDENT_AMBULATORY_CARE_PROVIDER_SITE_OTHER): Payer: Medicare HMO | Admitting: Family Medicine

## 2018-11-17 DIAGNOSIS — E1169 Type 2 diabetes mellitus with other specified complication: Secondary | ICD-10-CM | POA: Diagnosis not present

## 2018-11-17 DIAGNOSIS — E890 Postprocedural hypothyroidism: Secondary | ICD-10-CM | POA: Diagnosis not present

## 2018-11-17 DIAGNOSIS — M059 Rheumatoid arthritis with rheumatoid factor, unspecified: Secondary | ICD-10-CM

## 2018-11-17 DIAGNOSIS — E785 Hyperlipidemia, unspecified: Secondary | ICD-10-CM

## 2018-11-17 DIAGNOSIS — E559 Vitamin D deficiency, unspecified: Secondary | ICD-10-CM | POA: Diagnosis not present

## 2018-11-17 DIAGNOSIS — D638 Anemia in other chronic diseases classified elsewhere: Secondary | ICD-10-CM

## 2018-11-17 DIAGNOSIS — J3089 Other allergic rhinitis: Secondary | ICD-10-CM

## 2018-11-17 DIAGNOSIS — I10 Essential (primary) hypertension: Secondary | ICD-10-CM

## 2018-11-17 DIAGNOSIS — E66813 Obesity, class 3: Secondary | ICD-10-CM

## 2018-11-17 MED ORDER — BLOOD GLUCOSE METER KIT: PACK | 2 refills | Status: DC
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
## 2018-12-09 ENCOUNTER — Other Ambulatory Visit: Payer: Self-pay

## 2018-12-09 DIAGNOSIS — J3089 Other allergic rhinitis: Secondary | ICD-10-CM

## 2018-12-09 MED ORDER — FLUTICASONE PROPIONATE 50 MCG/ACT NA SUSP: 2.0000 | Freq: Every day | NASAL | 1 refills | Status: DC

## 2018-12-09 NOTE — Telephone Encounter (Signed)
Refill request for general medication. Fluticasone to [facility].   Last office visit 11/17/2018   Follow up on 01/18/2019

## 2018-12-12 ENCOUNTER — Other Ambulatory Visit: Payer: Self-pay | Admitting: Family Medicine

## 2018-12-12 DIAGNOSIS — J3089 Other allergic rhinitis: Secondary | ICD-10-CM

## 2019-01-04 ENCOUNTER — Other Ambulatory Visit: Payer: Self-pay

## 2019-01-04 ENCOUNTER — Ambulatory Visit
Admission: RE | Admit: 2019-01-04 | Discharge: 2019-01-04 | Disposition: A | Discharge status: Home / Self Care | Payer: Medicare HMO | Source: Ambulatory Visit | Attending: Family Medicine | Admitting: Family Medicine

## 2019-01-04 DIAGNOSIS — Z1231 Encounter for screening mammogram for malignant neoplasm of breast: Secondary | ICD-10-CM | POA: Insufficient documentation

## 2019-01-04 DIAGNOSIS — E2839 Other primary ovarian failure: Secondary | ICD-10-CM | POA: Insufficient documentation

## 2019-01-04 DIAGNOSIS — Z780 Asymptomatic menopausal state: Secondary | ICD-10-CM | POA: Diagnosis not present

## 2019-01-04 DIAGNOSIS — Z13820 Encounter for screening for osteoporosis: Secondary | ICD-10-CM | POA: Diagnosis not present

## 2019-01-08 ENCOUNTER — Other Ambulatory Visit: Payer: Self-pay | Admitting: Family Medicine

## 2019-01-08 DIAGNOSIS — J3089 Other allergic rhinitis: Secondary | ICD-10-CM

## 2019-01-13 DIAGNOSIS — E0500 Thyrotoxicosis with diffuse goiter without thyrotoxic crisis or storm: Secondary | ICD-10-CM | POA: Diagnosis not present

## 2019-01-18 ENCOUNTER — Other Ambulatory Visit: Payer: Self-pay

## 2019-01-18 ENCOUNTER — Ambulatory Visit (INDEPENDENT_AMBULATORY_CARE_PROVIDER_SITE_OTHER): Payer: Medicare HMO | Admitting: Family Medicine

## 2019-01-18 ENCOUNTER — Encounter: Payer: Self-pay | Admitting: Family Medicine

## 2019-01-18 VITALS — BP 118/78 | HR 103 | Temp 95.9°F | Resp 16 | Ht 67.0 in | Wt 300.8 lb

## 2019-01-18 DIAGNOSIS — E1169 Type 2 diabetes mellitus with other specified complication: Secondary | ICD-10-CM | POA: Diagnosis not present

## 2019-01-18 DIAGNOSIS — E785 Hyperlipidemia, unspecified: Secondary | ICD-10-CM | POA: Diagnosis not present

## 2019-01-18 DIAGNOSIS — R809 Proteinuria, unspecified: Secondary | ICD-10-CM

## 2019-01-18 DIAGNOSIS — M059 Rheumatoid arthritis with rheumatoid factor, unspecified: Secondary | ICD-10-CM

## 2019-01-18 DIAGNOSIS — I10 Essential (primary) hypertension: Secondary | ICD-10-CM

## 2019-01-18 DIAGNOSIS — E1129 Type 2 diabetes mellitus with other diabetic kidney complication: Secondary | ICD-10-CM | POA: Diagnosis not present

## 2019-01-18 DIAGNOSIS — E890 Postprocedural hypothyroidism: Secondary | ICD-10-CM

## 2019-01-18 LAB — POCT GLYCOSYLATED HEMOGLOBIN (HGB A1C): HbA1c, POC (controlled diabetic range): 6.3 % (ref 0.0–7.0)

## 2019-01-18 MED ORDER — ROSUVASTATIN CALCIUM 5 MG PO TABS: 5.0000 mg | ORAL_TABLET | Freq: Every day | ORAL | 1 refills | Status: DC

## 2019-01-18 MED ORDER — IRBESARTAN-HYDROCHLOROTHIAZIDE 150-12.5 MG PO TABS: 1.0000 | ORAL_TABLET | Freq: Every day | ORAL | 1 refills | Status: DC

## 2019-01-18 MED ORDER — LEVOTHYROXINE SODIUM 175 MCG PO TABS: 175.0000 ug | ORAL_TABLET | Freq: Every day | ORAL | 1 refills | Status: DC
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
## 2019-01-20 ENCOUNTER — Encounter: Payer: Self-pay | Admitting: Family Medicine

## 2019-03-03 ENCOUNTER — Telehealth: Payer: Self-pay | Admitting: Family Medicine

## 2019-03-03 NOTE — Chronic Care Management (AMB) (Signed)
Chronic Care Management   Note  03/03/2019 Name: Geneive Sandstrom MRN: [id number] DOB: [birth year]  Addilyn Satterwhite is a 68 y.o. year old female who is a primary care patient of Steele Sizer, MD. I reached out to [name] by phone today in response to a referral sent by Ms. Flora Lipps Swart's health plan.    Ms. Urschel was given information about Chronic Care Management services today including:  1. CCM service includes personalized support from designated clinical staff supervised by her physician, including individualized plan of care and coordination with other care providers 2. 24/7 contact phone numbers for assistance for urgent and routine care needs. 3. Service will only be billed when office clinical staff spend 20 minutes or more in a month to coordinate care. 4. Only one practitioner may furnish and bill the service in a calendar month. 5. The patient may stop CCM services at any time (effective at the end of the month) by phone call to the office staff. 6. The patient will be responsible for cost sharing (co-pay) of up to 20% of the service fee (after annual deductible is met).  Patient did not agree to enrollment in care management services and does not wish to consider at this time.  Follow up plan: The patient has been provided with contact information for the chronic care management team and has been advised to call with any health related questions or concerns.   Glen Head  [email or web address]   ??[phone]

## 2019-03-16 DIAGNOSIS — Z6841 Body Mass Index (BMI) 40.0 and over, adult: Secondary | ICD-10-CM | POA: Diagnosis not present

## 2019-03-16 DIAGNOSIS — M170 Bilateral primary osteoarthritis of knee: Secondary | ICD-10-CM | POA: Diagnosis not present

## 2019-03-16 DIAGNOSIS — M059 Rheumatoid arthritis with rheumatoid factor, unspecified: Secondary | ICD-10-CM | POA: Diagnosis not present

## 2019-03-16 DIAGNOSIS — L409 Psoriasis, unspecified: Secondary | ICD-10-CM | POA: Diagnosis not present

## 2019-03-16 DIAGNOSIS — Z79899 Other long term (current) drug therapy: Secondary | ICD-10-CM | POA: Diagnosis not present

## 2019-03-16 DIAGNOSIS — L4050 Arthropathic psoriasis, unspecified: Secondary | ICD-10-CM | POA: Diagnosis not present

## 2019-04-06 ENCOUNTER — Other Ambulatory Visit: Payer: Self-pay | Admitting: Family Medicine

## 2019-04-06 DIAGNOSIS — J3089 Other allergic rhinitis: Secondary | ICD-10-CM

## 2019-04-06 MED ORDER — LORATADINE 10 MG PO TABS: 10.0000 mg | ORAL_TABLET | Freq: Every day | ORAL | 3 refills | Status: DC

## 2019-04-06 NOTE — Telephone Encounter (Signed)
Medication Refill - Medication: loratadine (CLARITIN) 10 MG tablet    Preferred Pharmacy (with phone number or street name):  [place], [place]  [place] [address]  Phone: [phone] Fax: [phone]     Agent: Please be advised that RX refills may take up to 3 business days. We ask that you follow-up with your pharmacy.

## 2019-04-09 ENCOUNTER — Other Ambulatory Visit: Payer: Self-pay

## 2019-04-09 ENCOUNTER — Ambulatory Visit (INDEPENDENT_AMBULATORY_CARE_PROVIDER_SITE_OTHER): Payer: Medicare HMO

## 2019-04-09 DIAGNOSIS — Z23 Encounter for immunization: Secondary | ICD-10-CM

## 2019-05-25 ENCOUNTER — Ambulatory Visit: Payer: Medicare HMO | Admitting: Family Medicine

## 2019-07-20 ENCOUNTER — Other Ambulatory Visit: Payer: Self-pay

## 2019-07-20 ENCOUNTER — Ambulatory Visit (INDEPENDENT_AMBULATORY_CARE_PROVIDER_SITE_OTHER): Payer: Medicare HMO | Admitting: Family Medicine

## 2019-07-20 ENCOUNTER — Encounter: Payer: Self-pay | Admitting: Family Medicine

## 2019-07-20 ENCOUNTER — Ambulatory Visit (INDEPENDENT_AMBULATORY_CARE_PROVIDER_SITE_OTHER): Payer: Medicare HMO

## 2019-07-20 VITALS — BP 132/74 | HR 89 | Temp 97.1°F | Resp 18 | Ht 67.0 in | Wt 305.0 lb

## 2019-07-20 VITALS — BP 132/74 | HR 89 | Temp 97.1°F | Resp 20 | Ht 67.0 in | Wt 305.0 lb

## 2019-07-20 DIAGNOSIS — D638 Anemia in other chronic diseases classified elsewhere: Secondary | ICD-10-CM | POA: Diagnosis not present

## 2019-07-20 DIAGNOSIS — E559 Vitamin D deficiency, unspecified: Secondary | ICD-10-CM | POA: Diagnosis not present

## 2019-07-20 DIAGNOSIS — E890 Postprocedural hypothyroidism: Secondary | ICD-10-CM | POA: Diagnosis not present

## 2019-07-20 DIAGNOSIS — L4050 Arthropathic psoriasis, unspecified: Secondary | ICD-10-CM

## 2019-07-20 DIAGNOSIS — Z0000 Encounter for general adult medical examination without abnormal findings: Secondary | ICD-10-CM | POA: Diagnosis not present

## 2019-07-20 DIAGNOSIS — E1129 Type 2 diabetes mellitus with other diabetic kidney complication: Secondary | ICD-10-CM | POA: Diagnosis not present

## 2019-07-20 DIAGNOSIS — E1169 Type 2 diabetes mellitus with other specified complication: Secondary | ICD-10-CM | POA: Diagnosis not present

## 2019-07-20 DIAGNOSIS — R809 Proteinuria, unspecified: Secondary | ICD-10-CM | POA: Diagnosis not present

## 2019-07-20 DIAGNOSIS — M059 Rheumatoid arthritis with rheumatoid factor, unspecified: Secondary | ICD-10-CM

## 2019-07-20 DIAGNOSIS — I10 Essential (primary) hypertension: Secondary | ICD-10-CM

## 2019-07-20 DIAGNOSIS — M05741 Rheumatoid arthritis with rheumatoid factor of right hand without organ or systems involvement: Secondary | ICD-10-CM

## 2019-07-20 DIAGNOSIS — E785 Hyperlipidemia, unspecified: Secondary | ICD-10-CM | POA: Diagnosis not present

## 2019-07-20 DIAGNOSIS — Z1159 Encounter for screening for other viral diseases: Secondary | ICD-10-CM

## 2019-07-20 DIAGNOSIS — M05742 Rheumatoid arthritis with rheumatoid factor of left hand without organ or systems involvement: Secondary | ICD-10-CM

## 2019-07-20 LAB — CBC WITH DIFFERENTIAL/PLATELET
Absolute Monocytes: 520 cells/uL (ref 200–950)
Basophils Absolute: 32 cells/uL (ref 0–200)
Basophils Relative: 0.8 %
Eosinophils Absolute: 120 cells/uL (ref 15–500)
Eosinophils Relative: 3 %
HCT: 35.1 % (ref 35.0–45.0)
Hemoglobin: 11.7 g/dL (ref 11.7–15.5)
Lymphs Abs: 1412 cells/uL (ref 850–3900)
MCH: 29.3 pg (ref 27.0–33.0)
MCHC: 33.3 g/dL (ref 32.0–36.0)
MCV: 88 fL (ref 80.0–100.0)
MPV: 12.9 fL — ABNORMAL HIGH (ref 7.5–12.5)
Monocytes Relative: 13 %
Neutro Abs: 1916 cells/uL (ref 1500–7800)
Neutrophils Relative %: 47.9 %
Platelets: 178 10*3/uL (ref 140–400)
RBC: 3.99 10*6/uL (ref 3.80–5.10)
RDW: 14 % (ref 11.0–15.0)
Total Lymphocyte: 35.3 %
WBC: 4 10*3/uL (ref 3.8–10.8)

## 2019-07-20 LAB — COMPLETE METABOLIC PANEL WITH GFR
AG Ratio: 1.1 (calc) (ref 1.0–2.5)
ALT: 24 U/L (ref 6–29)
AST: 27 U/L (ref 10–35)
Albumin: 3.8 g/dL (ref 3.6–5.1)
Alkaline phosphatase (APISO): 76 U/L (ref 37–153)
BUN: 14 mg/dL (ref 7–25)
CO2: 30 mmol/L (ref 20–32)
Calcium: 9.6 mg/dL (ref 8.6–10.4)
Chloride: 106 mmol/L (ref 98–110)
Creat: 0.92 mg/dL (ref 0.50–0.99)
GFR, Est African American: 75 mL/min/{1.73_m2} (ref >=60)
GFR, Est Non African American: 65 mL/min/{1.73_m2} (ref >=60)
Globulin: 3.4 g/dL (calc) (ref 1.9–3.7)
Glucose, Bld: 111 mg/dL — ABNORMAL HIGH (ref 65–99)
Potassium: 4.4 mmol/L (ref 3.5–5.3)
Sodium: 141 mmol/L (ref 135–146)
Total Bilirubin: 0.5 mg/dL (ref 0.2–1.2)
Total Protein: 7.2 g/dL (ref 6.1–8.1)

## 2019-07-20 LAB — LIPID PANEL
Cholesterol: 115 mg/dL (ref <200)
HDL: 42 mg/dL — ABNORMAL LOW (ref >=50)
LDL Cholesterol (Calc): 53 mg/dL (calc)
Non-HDL Cholesterol (Calc): 73 mg/dL (calc) (ref <130)
Total CHOL/HDL Ratio: 2.7 (calc) (ref <5.0)
Triglycerides: 115 mg/dL (ref <150)

## 2019-07-20 LAB — VITAMIN D 25 HYDROXY (VIT D DEFICIENCY, FRACTURES): Vit D, 25-Hydroxy: 52 ng/mL (ref 30–100)

## 2019-07-20 LAB — POCT GLYCOSYLATED HEMOGLOBIN (HGB A1C): HbA1c, POC (controlled diabetic range): 6.1 % (ref 0.0–7.0)

## 2019-07-20 LAB — TSH: TSH: 1.07 mIU/L (ref 0.40–4.50)

## 2019-07-20 MED ORDER — IRBESARTAN-HYDROCHLOROTHIAZIDE 150-12.5 MG PO TABS: 1.0000 | ORAL_TABLET | Freq: Every day | ORAL | 1 refills | Status: DC

## 2019-07-20 MED ORDER — ROSUVASTATIN CALCIUM 5 MG PO TABS: 5.0000 mg | ORAL_TABLET | Freq: Every day | ORAL | 1 refills | Status: DC
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
## 2019-07-20 NOTE — Patient Instructions (Signed)
Taylor Perry , Thank you for taking time to come for your Medicare Wellness Visit. I appreciate your ongoing commitment to your health goals. Please review the following plan we discussed and let me know if I can assist you in the future.   Screening recommendations/referrals: Colonoscopy: done 06/14/14. Repeat in 2025.  Mammogram: done 01/04/19 Bone Density: done 01/04/19 Recommended yearly ophthalmology/optometry visit for glaucoma screening and checkup Recommended yearly dental visit for hygiene and checkup  Vaccinations: Influenza vaccine: done 04/09/19 Pneumococcal vaccine: done 01/08/18 Tdap vaccine: done 02/11/12 Shingles vaccine: Shingrix discussed. Please contact your pharmacy for coverage information.   Advanced directives: Advance directive discussed with you today. Even though you declined this today please call our office should you change your mind and we can give you the proper paperwork for you to fill out.  Conditions/risks identified: Keep up the great work!  Next appointment: Please follow up in one year for your Medicare Annual Wellness visit.     Preventive Care 68 Years and Older, Female Preventive care refers to lifestyle choices and visits with your health care provider that can promote health and wellness. What does preventive care include?  A yearly physical exam. This is also called an annual well check.  Dental exams once or twice a year.  Routine eye exams. Ask your health care provider how often you should have your eyes checked.  Personal lifestyle choices, including:  Daily care of your teeth and gums.  Regular physical activity.  Eating a healthy diet.  Avoiding tobacco and drug use.  Limiting alcohol use.  Practicing safe sex.  Taking low-dose aspirin every day.  Taking vitamin and mineral supplements as recommended by your health care provider. What happens during an annual well check? The services and screenings done by your health  care provider during your annual well check will depend on your age, overall health, lifestyle risk factors, and family history of disease. Counseling  Your health care provider may ask you questions about your:  Alcohol use.  Tobacco use.  Drug use.  Emotional well-being.  Home and relationship well-being.  Sexual activity.  Eating habits.  History of falls.  Memory and ability to understand (cognition).  Work and work Statistician.  Reproductive health. Screening  You may have the following tests or measurements:  Height, weight, and BMI.  Blood pressure.  Lipid and cholesterol levels. These may be checked every 5 years, or more frequently if you are over 15 years old.  Skin check.  Lung cancer screening. You may have this screening every year starting at age 37 if you have a 30-pack-year history of smoking and currently smoke or have quit within the past 15 years.  Fecal occult blood test (FOBT) of the stool. You may have this test every year starting at age 62.  Flexible sigmoidoscopy or colonoscopy. You may have a sigmoidoscopy every 5 years or a colonoscopy every 10 years starting at age 88.  Hepatitis C blood test.  Hepatitis B blood test.  Sexually transmitted disease (STD) testing.  Diabetes screening. This is done by checking your blood sugar (glucose) after you have not eaten for a while (fasting). You may have this done every 1-3 years.  Bone density scan. This is done to screen for osteoporosis. You may have this done starting at age 67.  Mammogram. This may be done every 1-2 years. Talk to your health care provider about how often you should have regular mammograms. Talk with your health care provider about your  test results, treatment options, and if necessary, the need for more tests. Vaccines  Your health care provider may recommend certain vaccines, such as:  Influenza vaccine. This is recommended every year.  Tetanus, diphtheria, and  acellular pertussis (Tdap, Td) vaccine. You may need a Td booster every 10 years.  Zoster vaccine. You may need this after age 79.  Pneumococcal 13-valent conjugate (PCV13) vaccine. One dose is recommended after age 57.  Pneumococcal polysaccharide (PPSV23) vaccine. One dose is recommended after age 52. Talk to your health care provider about which screenings and vaccines you need and how often you need them. This information is not intended to replace advice given to you by your health care provider. Make sure you discuss any questions you have with your health care provider. Document Released: 07/07/2015 Document Revised: 02/28/2016 Document Reviewed: 04/11/2015 Elsevier Interactive Patient Education  2017 [place] Prevention in the Home Falls can cause injuries. They can happen to people of all ages. There are many things you can do to make your home safe and to help prevent falls. What can I do on the outside of my home?  Regularly fix the edges of walkways and driveways and fix any cracks.  Remove anything that might make you trip as you walk through a door, such as a raised step or threshold.  Trim any bushes or trees on the path to your home.  Use bright outdoor lighting.  Clear any walking paths of anything that might make someone trip, such as rocks or tools.  Regularly check to see if handrails are loose or broken. Make sure that both sides of any steps have handrails.  Any raised decks and porches should have guardrails on the edges.  Have any leaves, snow, or ice cleared regularly.  Use sand or salt on walking paths during winter.  Clean up any spills in your garage right away. This includes oil or grease spills. What can I do in the bathroom?  Use night lights.  Install grab bars by the toilet and in the tub and shower. Do not use towel bars as grab bars.  Use non-skid mats or decals in the tub or shower.  If you need to sit down in the shower, use  a plastic, non-slip stool.  Keep the floor dry. Clean up any water that spills on the floor as soon as it happens.  Remove soap buildup in the tub or shower regularly.  Attach bath mats securely with double-sided non-slip rug tape.  Do not have throw rugs and other things on the floor that can make you trip. What can I do in the bedroom?  Use night lights.  Make sure that you have a light by your bed that is easy to reach.  Do not use any sheets or blankets that are too big for your bed. They should not hang down onto the floor.  Have a firm chair that has side arms. You can use this for support while you get dressed.  Do not have throw rugs and other things on the floor that can make you trip. What can I do in the kitchen?  Clean up any spills right away.  Avoid walking on wet floors.  Keep items that you use a lot in easy-to-reach places.  If you need to reach something above you, use a strong step stool that has a grab bar.  Keep electrical cords out of the way.  Do not use floor polish or wax that  makes floors slippery. If you must use wax, use non-skid floor wax.  Do not have throw rugs and other things on the floor that can make you trip. What can I do with my stairs?  Do not leave any items on the stairs.  Make sure that there are handrails on both sides of the stairs and use them. Fix handrails that are broken or loose. Make sure that handrails are as long as the stairways.  Check any carpeting to make sure that it is firmly attached to the stairs. Fix any carpet that is loose or worn.  Avoid having throw rugs at the top or bottom of the stairs. If you do have throw rugs, attach them to the floor with carpet tape.  Make sure that you have a light switch at the top of the stairs and the bottom of the stairs. If you do not have them, ask someone to add them for you. What else can I do to help prevent falls?  Wear shoes that:  Do not have high heels.  Have  rubber bottoms.  Are comfortable and fit you well.  Are closed at the toe. Do not wear sandals.  If you use a stepladder:  Make sure that it is fully opened. Do not climb a closed stepladder.  Make sure that both sides of the stepladder are locked into place.  Ask someone to hold it for you, if possible.  Clearly mark and make sure that you can see:  Any grab bars or handrails.  First and last steps.  Where the edge of each step is.  Use tools that help you move around (mobility aids) if they are needed. These include:  Canes.  Walkers.  Scooters.  Crutches.  Turn on the lights when you go into a dark area. Replace any light bulbs as soon as they burn out.  Set up your furniture so you have a clear path. Avoid moving your furniture around.  If any of your floors are uneven, fix them.  If there are any pets around you, be aware of where they are.  Review your medicines with your doctor. Some medicines can make you feel dizzy. This can increase your chance of falling. Ask your doctor what other things that you can do to help prevent falls. This information is not intended to replace advice given to you by your health care provider. Make sure you discuss any questions you have with your health care provider. Document Released: 04/06/2009 Document Revised: 11/16/2015 Document Reviewed: 07/15/2014 Elsevier Interactive Patient Education  2017 [employer].
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
## 2019-07-20 NOTE — Progress Notes (Signed)
63
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
## 2019-07-21 LAB — MICROALBUMIN / CREATININE URINE RATIO
Creatinine, Urine: 106 mg/dL (ref 20–275)
Microalb Creat Ratio: 39 mcg/mg creat — ABNORMAL HIGH (ref <30)
Microalb, Ur: 4.1 mg/dL

## 2019-08-09 ENCOUNTER — Encounter: Payer: Self-pay | Admitting: Family Medicine

## 2019-09-13 DIAGNOSIS — L409 Psoriasis, unspecified: Secondary | ICD-10-CM | POA: Diagnosis not present

## 2019-09-13 DIAGNOSIS — L4050 Arthropathic psoriasis, unspecified: Secondary | ICD-10-CM | POA: Diagnosis not present

## 2019-09-13 DIAGNOSIS — Z6841 Body Mass Index (BMI) 40.0 and over, adult: Secondary | ICD-10-CM | POA: Diagnosis not present

## 2019-09-13 DIAGNOSIS — M059 Rheumatoid arthritis with rheumatoid factor, unspecified: Secondary | ICD-10-CM | POA: Diagnosis not present

## 2019-09-13 DIAGNOSIS — M170 Bilateral primary osteoarthritis of knee: Secondary | ICD-10-CM | POA: Diagnosis not present

## 2019-09-13 DIAGNOSIS — Z79899 Other long term (current) drug therapy: Secondary | ICD-10-CM | POA: Diagnosis not present

## 2019-10-12 ENCOUNTER — Other Ambulatory Visit: Payer: Self-pay | Admitting: Family Medicine

## 2019-10-12 DIAGNOSIS — E890 Postprocedural hypothyroidism: Secondary | ICD-10-CM

## 2019-10-12 NOTE — Telephone Encounter (Signed)
Requested Prescriptions  Pending Prescriptions Disp Refills  . levothyroxine (SYNTHROID) 175 MCG tablet [Pharmacy Med Name: LEVOTHYROXINE SODIUM 175 MCG Tablet] 85 tablet 2    Sig: TAKE 1 TABLET EVERY DAY EXCEPT TAKE 1/2 TABLET ON SUNDAYS AS DIRECTED     Endocrinology:  Hypothyroid Agents Failed - 10/12/2019 11:12 PM      Failed - TSH needs to be rechecked within 3 months after an abnormal result. Refill until TSH is due.      Passed - TSH in normal range and within 360 days    TSH  Date Value Ref Range Status  07/20/2019 1.07 0.40 - 4.50 mIU/L Final         Passed - Valid encounter within last 12 months    Recent Outpatient Visits          2 months ago Controlled type 2 diabetes mellitus with microalbuminuria, without long-term current use of insulin [facility])   [facility] [place], Drue Stager, MD   8 months ago Controlled type 2 diabetes mellitus with microalbuminuria, without long-term current use of insulin [facility])   [facility] Steele Sizer, MD   10 months ago Seropositive rheumatoid arthritis [facility])   [facility] [place], Drue Stager, MD   1 year ago Controlled type 2 diabetes mellitus with microalbuminuria, without long-term current use of insulin [facility])   [facility] [place], Drue Stager, MD   1 year ago Controlled type 2 diabetes mellitus with microalbuminuria, without long-term current use of insulin [facility])   [facility] Steele Sizer, MD      Future Appointments            In 4 weeks Ancil Boozer, Drue Stager, MD [facility] [place]   In 9 months  [facility], [facility]

## 2019-11-10 ENCOUNTER — Encounter: Payer: Self-pay | Admitting: Family Medicine

## 2019-11-10 ENCOUNTER — Ambulatory Visit (INDEPENDENT_AMBULATORY_CARE_PROVIDER_SITE_OTHER): Payer: Medicare HMO | Admitting: Family Medicine

## 2019-11-10 ENCOUNTER — Other Ambulatory Visit: Payer: Self-pay

## 2019-11-10 VITALS — BP 110/80 | HR 92 | Temp 96.9°F | Resp 16 | Ht 67.0 in | Wt 304.0 lb

## 2019-11-10 DIAGNOSIS — I10 Essential (primary) hypertension: Secondary | ICD-10-CM

## 2019-11-10 DIAGNOSIS — M05741 Rheumatoid arthritis with rheumatoid factor of right hand without organ or systems involvement: Secondary | ICD-10-CM

## 2019-11-10 DIAGNOSIS — M059 Rheumatoid arthritis with rheumatoid factor, unspecified: Secondary | ICD-10-CM

## 2019-11-10 DIAGNOSIS — E1129 Type 2 diabetes mellitus with other diabetic kidney complication: Secondary | ICD-10-CM | POA: Diagnosis not present

## 2019-11-10 DIAGNOSIS — E1169 Type 2 diabetes mellitus with other specified complication: Secondary | ICD-10-CM

## 2019-11-10 DIAGNOSIS — L4050 Arthropathic psoriasis, unspecified: Secondary | ICD-10-CM

## 2019-11-10 DIAGNOSIS — J3089 Other allergic rhinitis: Secondary | ICD-10-CM

## 2019-11-10 DIAGNOSIS — D638 Anemia in other chronic diseases classified elsewhere: Secondary | ICD-10-CM

## 2019-11-10 DIAGNOSIS — E890 Postprocedural hypothyroidism: Secondary | ICD-10-CM

## 2019-11-10 DIAGNOSIS — E785 Hyperlipidemia, unspecified: Secondary | ICD-10-CM | POA: Diagnosis not present

## 2019-11-10 DIAGNOSIS — R809 Proteinuria, unspecified: Secondary | ICD-10-CM

## 2019-11-10 DIAGNOSIS — M05742 Rheumatoid arthritis with rheumatoid factor of left hand without organ or systems involvement: Secondary | ICD-10-CM

## 2019-11-10 DIAGNOSIS — Q752 Hypertelorism: Secondary | ICD-10-CM

## 2019-11-10 LAB — POCT GLYCOSYLATED HEMOGLOBIN (HGB A1C): Hemoglobin A1C: 6 % — AB (ref 4.0–5.6)

## 2019-11-10 MED ORDER — ROSUVASTATIN CALCIUM 5 MG PO TABS: 5.0000 mg | ORAL_TABLET | Freq: Every day | ORAL | 1 refills | Status: DC

## 2019-11-10 MED ORDER — FLUTICASONE PROPIONATE 50 MCG/ACT NA SUSP: NASAL | 2 refills | Status: DC

## 2019-11-10 MED ORDER — IRBESARTAN-HYDROCHLOROTHIAZIDE 150-12.5 MG PO TABS: 1.0000 | ORAL_TABLET | Freq: Every day | ORAL | 1 refills | Status: DC
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
## 2020-01-13 ENCOUNTER — Other Ambulatory Visit: Payer: Self-pay | Admitting: Family Medicine

## 2020-01-13 DIAGNOSIS — Z1231 Encounter for screening mammogram for malignant neoplasm of breast: Secondary | ICD-10-CM

## 2020-02-01 ENCOUNTER — Other Ambulatory Visit: Payer: Self-pay | Admitting: Family Medicine

## 2020-02-01 ENCOUNTER — Ambulatory Visit
Admission: RE | Admit: 2020-02-01 | Discharge: 2020-02-01 | Disposition: A | Discharge status: Home / Self Care | Payer: Medicare HMO | Source: Ambulatory Visit | Attending: Family Medicine | Admitting: Family Medicine

## 2020-02-01 ENCOUNTER — Other Ambulatory Visit: Payer: Self-pay

## 2020-02-01 DIAGNOSIS — N6489 Other specified disorders of breast: Secondary | ICD-10-CM

## 2020-02-01 DIAGNOSIS — Z1231 Encounter for screening mammogram for malignant neoplasm of breast: Secondary | ICD-10-CM | POA: Diagnosis not present

## 2020-02-01 DIAGNOSIS — R928 Other abnormal and inconclusive findings on diagnostic imaging of breast: Secondary | ICD-10-CM

## 2020-02-09 ENCOUNTER — Ambulatory Visit
Admission: RE | Admit: 2020-02-09 | Discharge: 2020-02-09 | Disposition: A | Discharge status: Home / Self Care | Payer: Medicare HMO | Source: Ambulatory Visit | Attending: Family Medicine | Admitting: Family Medicine

## 2020-02-09 ENCOUNTER — Other Ambulatory Visit: Payer: Self-pay

## 2020-02-09 DIAGNOSIS — N6002 Solitary cyst of left breast: Secondary | ICD-10-CM | POA: Diagnosis not present

## 2020-02-09 DIAGNOSIS — N6001 Solitary cyst of right breast: Secondary | ICD-10-CM | POA: Diagnosis not present

## 2020-02-09 DIAGNOSIS — R928 Other abnormal and inconclusive findings on diagnostic imaging of breast: Secondary | ICD-10-CM | POA: Insufficient documentation

## 2020-02-09 DIAGNOSIS — N6489 Other specified disorders of breast: Secondary | ICD-10-CM | POA: Insufficient documentation

## 2020-02-14 ENCOUNTER — Other Ambulatory Visit: Payer: Self-pay

## 2020-02-14 DIAGNOSIS — R928 Other abnormal and inconclusive findings on diagnostic imaging of breast: Secondary | ICD-10-CM
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
## 2020-03-13 ENCOUNTER — Encounter: Payer: Self-pay | Admitting: Family Medicine

## 2020-03-13 ENCOUNTER — Ambulatory Visit (INDEPENDENT_AMBULATORY_CARE_PROVIDER_SITE_OTHER): Payer: Medicare HMO | Admitting: Family Medicine

## 2020-03-13 ENCOUNTER — Other Ambulatory Visit: Payer: Self-pay

## 2020-03-13 VITALS — BP 116/78 | HR 98 | Temp 98.0°F | Resp 20 | Ht 67.0 in | Wt 304.3 lb

## 2020-03-13 DIAGNOSIS — E785 Hyperlipidemia, unspecified: Secondary | ICD-10-CM

## 2020-03-13 DIAGNOSIS — E1129 Type 2 diabetes mellitus with other diabetic kidney complication: Secondary | ICD-10-CM

## 2020-03-13 DIAGNOSIS — E032 Hypothyroidism due to medicaments and other exogenous substances: Secondary | ICD-10-CM

## 2020-03-13 DIAGNOSIS — E890 Postprocedural hypothyroidism: Secondary | ICD-10-CM | POA: Diagnosis not present

## 2020-03-13 DIAGNOSIS — Z23 Encounter for immunization: Secondary | ICD-10-CM | POA: Diagnosis not present

## 2020-03-13 DIAGNOSIS — E559 Vitamin D deficiency, unspecified: Secondary | ICD-10-CM

## 2020-03-13 DIAGNOSIS — L4050 Arthropathic psoriasis, unspecified: Secondary | ICD-10-CM | POA: Diagnosis not present

## 2020-03-13 DIAGNOSIS — D638 Anemia in other chronic diseases classified elsewhere: Secondary | ICD-10-CM

## 2020-03-13 DIAGNOSIS — Q752 Hypertelorism: Secondary | ICD-10-CM

## 2020-03-13 DIAGNOSIS — I10 Essential (primary) hypertension: Secondary | ICD-10-CM

## 2020-03-13 DIAGNOSIS — E1169 Type 2 diabetes mellitus with other specified complication: Secondary | ICD-10-CM | POA: Diagnosis not present

## 2020-03-13 DIAGNOSIS — R809 Proteinuria, unspecified: Secondary | ICD-10-CM | POA: Diagnosis not present

## 2020-03-13 DIAGNOSIS — M05741 Rheumatoid arthritis with rheumatoid factor of right hand without organ or systems involvement: Secondary | ICD-10-CM

## 2020-03-13 DIAGNOSIS — M05742 Rheumatoid arthritis with rheumatoid factor of left hand without organ or systems involvement: Secondary | ICD-10-CM

## 2020-03-13 LAB — POCT GLYCOSYLATED HEMOGLOBIN (HGB A1C): Hemoglobin A1C: 6.5 % — AB (ref 4.0–5.6)

## 2020-03-15 ENCOUNTER — Other Ambulatory Visit: Payer: Self-pay | Admitting: Family Medicine

## 2020-03-15 DIAGNOSIS — E1129 Type 2 diabetes mellitus with other diabetic kidney complication: Secondary | ICD-10-CM

## 2020-03-15 DIAGNOSIS — M8949 Other hypertrophic osteoarthropathy, multiple sites: Secondary | ICD-10-CM | POA: Diagnosis not present

## 2020-03-15 DIAGNOSIS — L409 Psoriasis, unspecified: Secondary | ICD-10-CM | POA: Diagnosis not present

## 2020-03-15 DIAGNOSIS — Z6841 Body Mass Index (BMI) 40.0 and over, adult: Secondary | ICD-10-CM | POA: Diagnosis not present

## 2020-03-15 DIAGNOSIS — I10 Essential (primary) hypertension: Secondary | ICD-10-CM

## 2020-03-15 DIAGNOSIS — E890 Postprocedural hypothyroidism: Secondary | ICD-10-CM

## 2020-03-15 DIAGNOSIS — Z79899 Other long term (current) drug therapy: Secondary | ICD-10-CM | POA: Diagnosis not present

## 2020-03-15 DIAGNOSIS — M059 Rheumatoid arthritis with rheumatoid factor, unspecified: Secondary | ICD-10-CM | POA: Diagnosis not present

## 2020-03-15 DIAGNOSIS — L4050 Arthropathic psoriasis, unspecified: Secondary | ICD-10-CM | POA: Diagnosis not present

## 2020-03-15 DIAGNOSIS — E1169 Type 2 diabetes mellitus with other specified complication: Secondary | ICD-10-CM

## 2020-03-15 NOTE — Telephone Encounter (Signed)
Copied from [place] [id number]. Topic: Quick Communication - Rx Refill/Question >> Mar 15, 2020  1:06 PM Mcneil, Ja-Kwan wrote: Medication: rosuvastatin (CRESTOR) 5 MG tablet, irbesartan-hydrochlorothiazide (AVALIDE) 150-12.5 MG tablet, and levothyroxine (SYNTHROID) 175 MCG tablet  Has the patient contacted their pharmacy? no  Preferred Pharmacy (with phone number or street name): [place], [place] Phone: [phone]  Fax: [phone]  Agent: Please be advised that RX refills may take up to 3 business days. We ask that you follow-up with your pharmacy.

## 2020-04-08 ENCOUNTER — Other Ambulatory Visit: Payer: Self-pay | Admitting: Family Medicine

## 2020-04-08 DIAGNOSIS — I10 Essential (primary) hypertension: Secondary | ICD-10-CM

## 2020-04-08 DIAGNOSIS — E1129 Type 2 diabetes mellitus with other diabetic kidney complication: Secondary | ICD-10-CM

## 2020-04-08 DIAGNOSIS — R809 Proteinuria, unspecified: Secondary | ICD-10-CM

## 2020-04-08 NOTE — Telephone Encounter (Signed)
Requested Prescriptions  Pending Prescriptions Disp Refills   irbesartan-hydrochlorothiazide (AVALIDE) 150-12.5 MG tablet [Pharmacy Med Name: IRBESARTAN/HYDROCHLOROTHIAZIDE 150-12.5 MG Tablet] 90 tablet 1    Sig: TAKE 1 TABLET BY MOUTH DAILY FOR BLOOD PRESSURE     Cardiovascular: ARB + Diuretic Combos Failed - 04/08/2020  6:13 PM      Failed - K in normal range and within 180 days    Potassium  Date Value Ref Range Status  07/20/2019 4.4 3.5 - 5.3 mmol/L Final         Failed - Na in normal range and within 180 days    Sodium  Date Value Ref Range Status  07/20/2019 141 135 - 146 mmol/L Final         Failed - Cr in normal range and within 180 days    Creat  Date Value Ref Range Status  07/20/2019 0.92 0.50 - 0.99 mg/dL Final    Comment:    For patients >55 years of age, the reference limit for Creatinine is approximately 13% higher for people identified as African-American. .    Creatinine, POC  Date Value Ref Range Status  07/11/2017 negative mg/dL Final   Creatinine, Urine  Date Value Ref Range Status  07/20/2019 106 20 - 275 mg/dL Final         Failed - Ca in normal range and within 180 days    Calcium  Date Value Ref Range Status  07/20/2019 9.6 8.6 - 10.4 mg/dL Final         Passed - Patient is not pregnant      Passed - Last BP in normal range    BP Readings from Last 1 Encounters:  03/13/20 116/78         Passed - Valid encounter within last 6 months    Recent Outpatient Visits          3 weeks ago Dyslipidemia associated with type 2 diabetes mellitus [facility])   [facility] Steele Sizer, MD   5 months ago Dyslipidemia associated with type 2 diabetes mellitus [facility])   [facility] Steele Sizer, MD   8 months ago Controlled type 2 diabetes mellitus with microalbuminuria, without long-term current use of insulin [facility])   [facility] [place], Drue Stager, MD   1 year ago Controlled type 2 diabetes  mellitus with microalbuminuria, without long-term current use of insulin [facility])   [facility] Steele Sizer, MD   1 year ago Seropositive rheumatoid arthritis [facility])   [facility] Steele Sizer, MD      Future Appointments            In 3 months Ancil Boozer, Drue Stager, MD [facility] [place]   In 3 months  [facility], [facility]

## 2020-04-14 ENCOUNTER — Telehealth: Payer: Self-pay | Admitting: *Deleted

## 2020-04-14 NOTE — Chronic Care Management (AMB) (Signed)
  Chronic Care Management   Outreach Note  04/14/2020 Name: [name] MRN: [id number] DOB: 1950-07-04  Taylor Perry is a 69 y.o. year old female who is a primary care patient of Steele Sizer, MD. I reached out to [name] by phone today in response to a referral sent by Ms. Flora Lipps Worthey's health plan.     An unsuccessful telephone outreach was attempted today. The patient was referred to the case management team for assistance with care management and care coordination.   Follow Up Plan: A HIPAA compliant phone message was left for the patient providing contact information and requesting a return call. The care management team will reach out to the patient again over the next 7-14 days. If patient returns call to provider office, please advise to call [place] at [phone].  [place] Management

## 2020-04-25 NOTE — Chronic Care Management (AMB) (Signed)
  Chronic Care Management   Note  04/25/2020 Name: Armandina Iman MRN: [id number] DOB: 1950/12/31  Virtie Bungert is a 69 y.o. year old female who is a primary care patient of Steele Sizer, MD. I reached out to [name] by phone today in response to a referral sent by Ms. Flora Lipps Dunphy's health plan.     Ms. Cammarano was given information about Chronic Care Management services today including:  1. CCM service includes personalized support from designated clinical staff supervised by her physician, including individualized plan of care and coordination with other care providers 2. 24/7 contact phone numbers for assistance for urgent and routine care needs. 3. Service will only be billed when office clinical staff spend 20 minutes or more in a month to coordinate care. 4. Only one practitioner may furnish and bill the service in a calendar month. 5. The patient may stop CCM services at any time (effective at the end of the month) by phone call to the office staff. 6. The patient will be responsible for cost sharing (co-pay) of up to 20% of the service fee (after annual deductible is met).  Patient agreed to services and verbal consent obtained.   Follow up plan: Telephone appointment with care management team member scheduled for:05/05/2020  [place], [place], [place] [address] Direct Dial: [email or web address] Website: [email or web address]

## 2020-05-01 ENCOUNTER — Encounter: Payer: Self-pay | Admitting: Family Medicine

## 2020-05-05 ENCOUNTER — Telehealth: Payer: Medicare HMO

## 2020-05-05 ENCOUNTER — Telehealth: Payer: Self-pay

## 2020-05-05 NOTE — Telephone Encounter (Signed)
°  Chronic Care Management   Outreach Note  05/05/2020 Name: Taylor Perry MRN: [id number] DOB: 07/29/1950  Primary Care Provider: Steele Sizer, MD Reason for referral : Chronic Care Management   An unsuccessful telephone outreach was attempted today. Ms. Arvin was referred to the case management team for assistance with care management and care coordination.     Follow Up Plan:  A HIPAA compliant voice message was left today requesting a return call.    Cristy [facility] Care Management [facility] [phone]

## 2020-07-10 ENCOUNTER — Telehealth: Payer: Self-pay

## 2020-07-10 DIAGNOSIS — E890 Postprocedural hypothyroidism: Secondary | ICD-10-CM

## 2020-07-10 NOTE — Telephone Encounter (Signed)
lvm to inform ptCopied from [place] [id number]. Topic: Quick Communication - See Telephone Encounter >> Jul 10, 2020 11:13 AM Loma [place] wrote: CRM for notification. See Telephone encounter for: 07/10/20. levothyroxine (SYNTHROID) 175 MCG tablet pt has made appt for 2/1 is out of this med, has new ins also, requesting if should asjk for 10 pills or so to get to next appt 2/1 wants a nurse call at [phone]

## 2020-07-11 MED ORDER — LEVOTHYROXINE SODIUM 175 MCG PO TABS: 175.0000 ug | ORAL_TABLET | Freq: Every day | ORAL | 0 refills | Status: DC

## 2020-07-13 ENCOUNTER — Ambulatory Visit: Payer: Medicare HMO | Admitting: Family Medicine

## 2020-07-19 DIAGNOSIS — Z79899 Other long term (current) drug therapy: Secondary | ICD-10-CM | POA: Diagnosis not present

## 2020-07-19 DIAGNOSIS — L4050 Arthropathic psoriasis, unspecified: Secondary | ICD-10-CM | POA: Diagnosis not present

## 2020-07-19 DIAGNOSIS — L409 Psoriasis, unspecified: Secondary | ICD-10-CM | POA: Diagnosis not present

## 2020-07-19 DIAGNOSIS — M059 Rheumatoid arthritis with rheumatoid factor, unspecified: Secondary | ICD-10-CM | POA: Diagnosis not present

## 2020-07-19 DIAGNOSIS — M170 Bilateral primary osteoarthritis of knee: Secondary | ICD-10-CM | POA: Diagnosis not present

## 2020-07-24 ENCOUNTER — Telehealth: Payer: Self-pay | Admitting: Family Medicine

## 2020-07-24 NOTE — Telephone Encounter (Signed)
Copied from [place] [id number]. Topic: Medicare AWV >> Jul 24, 2020 11:10 AM Cher Nakai R wrote: Reason for CRM:   Left message for patient to notify AWVS scheduled Jul 25, 2020 will be completed by phone at 11:20 instead of in the office

## 2020-07-25 ENCOUNTER — Ambulatory Visit (INDEPENDENT_AMBULATORY_CARE_PROVIDER_SITE_OTHER): Payer: Medicare Other | Admitting: Family Medicine

## 2020-07-25 ENCOUNTER — Ambulatory Visit: Payer: Medicare HMO

## 2020-07-25 ENCOUNTER — Ambulatory Visit: Payer: Medicare Other

## 2020-07-25 ENCOUNTER — Other Ambulatory Visit: Payer: Self-pay

## 2020-07-25 ENCOUNTER — Encounter: Payer: Self-pay | Admitting: Family Medicine

## 2020-07-25 VITALS — BP 120/80 | HR 87 | Temp 98.1°F | Resp 16 | Ht 67.0 in | Wt 289.9 lb

## 2020-07-25 DIAGNOSIS — J3089 Other allergic rhinitis: Secondary | ICD-10-CM | POA: Diagnosis not present

## 2020-07-25 DIAGNOSIS — E785 Hyperlipidemia, unspecified: Secondary | ICD-10-CM

## 2020-07-25 DIAGNOSIS — L4050 Arthropathic psoriasis, unspecified: Secondary | ICD-10-CM | POA: Diagnosis not present

## 2020-07-25 DIAGNOSIS — D638 Anemia in other chronic diseases classified elsewhere: Secondary | ICD-10-CM | POA: Diagnosis not present

## 2020-07-25 DIAGNOSIS — M059 Rheumatoid arthritis with rheumatoid factor, unspecified: Secondary | ICD-10-CM | POA: Diagnosis not present

## 2020-07-25 DIAGNOSIS — E1169 Type 2 diabetes mellitus with other specified complication: Secondary | ICD-10-CM | POA: Diagnosis not present

## 2020-07-25 DIAGNOSIS — E559 Vitamin D deficiency, unspecified: Secondary | ICD-10-CM | POA: Diagnosis not present

## 2020-07-25 DIAGNOSIS — E032 Hypothyroidism due to medicaments and other exogenous substances: Secondary | ICD-10-CM

## 2020-07-25 DIAGNOSIS — E1129 Type 2 diabetes mellitus with other diabetic kidney complication: Secondary | ICD-10-CM | POA: Diagnosis not present

## 2020-07-25 DIAGNOSIS — I10 Essential (primary) hypertension: Secondary | ICD-10-CM

## 2020-07-25 DIAGNOSIS — M05741 Rheumatoid arthritis with rheumatoid factor of right hand without organ or systems involvement: Secondary | ICD-10-CM

## 2020-07-25 DIAGNOSIS — M05742 Rheumatoid arthritis with rheumatoid factor of left hand without organ or systems involvement: Secondary | ICD-10-CM

## 2020-07-25 DIAGNOSIS — R809 Proteinuria, unspecified: Secondary | ICD-10-CM

## 2020-07-25 LAB — POCT GLYCOSYLATED HEMOGLOBIN (HGB A1C): Hemoglobin A1C: 5.7 % — AB (ref 4.0–5.6)

## 2020-07-25 MED ORDER — IRBESARTAN-HYDROCHLOROTHIAZIDE 150-12.5 MG PO TABS: 1.0000 | ORAL_TABLET | Freq: Every day | ORAL | 1 refills | Status: DC

## 2020-07-25 MED ORDER — ROSUVASTATIN CALCIUM 5 MG PO TABS: 5.0000 mg | ORAL_TABLET | Freq: Every day | ORAL | 1 refills | Status: DC

## 2020-07-25 MED ORDER — LORATADINE 10 MG PO TABS: 10.0000 mg | ORAL_TABLET | Freq: Every day | ORAL | 3 refills | Status: DC
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
## 2020-07-26 LAB — COMPLETE METABOLIC PANEL WITH GFR
AG Ratio: 1.2 (calc) (ref 1.0–2.5)
ALT: 18 U/L (ref 6–29)
AST: 20 U/L (ref 10–35)
Albumin: 3.8 g/dL (ref 3.6–5.1)
Alkaline phosphatase (APISO): 83 U/L (ref 37–153)
BUN: 13 mg/dL (ref 7–25)
CO2: 29 mmol/L (ref 20–32)
Calcium: 9.1 mg/dL (ref 8.6–10.4)
Chloride: 105 mmol/L (ref 98–110)
Creat: 0.92 mg/dL (ref 0.50–0.99)
GFR, Est African American: 75 mL/min/{1.73_m2} (ref >=60)
GFR, Est Non African American: 64 mL/min/{1.73_m2} (ref >=60)
Globulin: 3.3 g/dL (calc) (ref 1.9–3.7)
Glucose, Bld: 87 mg/dL (ref 65–99)
Potassium: 4.2 mmol/L (ref 3.5–5.3)
Sodium: 140 mmol/L (ref 135–146)
Total Bilirubin: 0.5 mg/dL (ref 0.2–1.2)
Total Protein: 7.1 g/dL (ref 6.1–8.1)

## 2020-07-26 LAB — LIPID PANEL
Cholesterol: 129 mg/dL (ref <200)
HDL: 41 mg/dL — ABNORMAL LOW (ref >=50)
LDL Cholesterol (Calc): 68 mg/dL (calc)
Non-HDL Cholesterol (Calc): 88 mg/dL (calc) (ref <130)
Total CHOL/HDL Ratio: 3.1 (calc) (ref <5.0)
Triglycerides: 117 mg/dL (ref <150)

## 2020-07-26 LAB — MICROALBUMIN / CREATININE URINE RATIO
Creatinine, Urine: 88 mg/dL (ref 20–275)
Microalb Creat Ratio: 6 mcg/mg creat (ref <30)
Microalb, Ur: 0.5 mg/dL

## 2020-07-26 LAB — VITAMIN D 25 HYDROXY (VIT D DEFICIENCY, FRACTURES): Vit D, 25-Hydroxy: 52 ng/mL (ref 30–100)

## 2020-07-26 LAB — TSH: TSH: 0.5 mIU/L (ref 0.40–4.50)

## 2020-08-08 ENCOUNTER — Other Ambulatory Visit: Payer: Self-pay | Admitting: Family Medicine

## 2020-08-08 ENCOUNTER — Ambulatory Visit: Payer: Medicare Other

## 2020-08-08 DIAGNOSIS — E890 Postprocedural hypothyroidism: Secondary | ICD-10-CM

## 2020-08-08 NOTE — Telephone Encounter (Signed)
Requested Prescriptions  Pending Prescriptions Disp Refills  . levothyroxine (SYNTHROID) 175 MCG tablet [Pharmacy Med Name: LEVOTHYROXINE 175 MCG TABLET] 30 tablet 0    Sig: TAKE 1 TABLET BY MOUTH DAILY BEFORE BREAKFAST.     Endocrinology:  Hypothyroid Agents Failed - 08/08/2020 10:30 AM      Failed - TSH needs to be rechecked within 3 months after an abnormal result. Refill until TSH is due.      Passed - TSH in normal range and within 360 days    TSH  Date Value Ref Range Status  07/25/2020 0.50 0.40 - 4.50 mIU/L Final         Passed - Valid encounter within last 12 months    Recent Outpatient Visits          2 weeks ago Dyslipidemia associated with type 2 diabetes mellitus [facility])   [facility] [place], Drue Stager, MD   4 months ago Dyslipidemia associated with type 2 diabetes mellitus [facility])   [facility] [place], Drue Stager, MD   9 months ago Dyslipidemia associated with type 2 diabetes mellitus [facility])   [facility] [place], Drue Stager, MD   1 year ago Controlled type 2 diabetes mellitus with microalbuminuria, without long-term current use of insulin [facility])   [facility] [place], Drue Stager, MD   1 year ago Controlled type 2 diabetes mellitus with microalbuminuria, without long-term current use of insulin [facility])   [facility] Steele Sizer, MD      Future Appointments            Today  [facility], [place]   In 3 months Steele Sizer, MD [facility], [facility]

## 2020-08-09 ENCOUNTER — Other Ambulatory Visit: Payer: Self-pay

## 2020-08-09 DIAGNOSIS — E890 Postprocedural hypothyroidism: Secondary | ICD-10-CM

## 2020-08-09 MED ORDER — LEVOTHYROXINE SODIUM 175 MCG PO TABS
ORAL_TABLET | ORAL | 1 refills | Status: DC
Start: 2020-08-09 — End: 2020-09-07

## 2020-09-07 ENCOUNTER — Other Ambulatory Visit: Payer: Self-pay | Admitting: Family Medicine

## 2020-09-07 DIAGNOSIS — E890 Postprocedural hypothyroidism: Secondary | ICD-10-CM

## 2020-10-03 ENCOUNTER — Ambulatory Visit (INDEPENDENT_AMBULATORY_CARE_PROVIDER_SITE_OTHER): Payer: Medicare Other

## 2020-10-03 DIAGNOSIS — Z0000 Encounter for general adult medical examination without abnormal findings: Secondary | ICD-10-CM | POA: Diagnosis not present

## 2020-10-03 NOTE — Patient Instructions (Signed)
Taylor Perry , Thank you for taking time to come for your Medicare Wellness Visit. I appreciate your ongoing commitment to your health goals. Please review the following plan we discussed and let me know if I can assist you in the future.   Screening recommendations/referrals: Colonoscopy: done 06/14/14. Repeat in 2025 Mammogram: done 02/14/20 Bone Density: done 01/13/19 Recommended yearly ophthalmology/optometry visit for glaucoma screening and checkup Recommended yearly dental visit for hygiene and checkup  Vaccinations: Influenza vaccine: done 03/13/20 Pneumococcal vaccine: done 01/08/18 Tdap vaccine: done 02/11/12 Shingles vaccine: Shingrix discussed. Please contact your pharmacy for coverage information.  Covid-19: done 09/01/19, 10/04/19 & 04/19/20  Advanced directives: Advance directive discussed with you today. Even though you declined this today please call our office should you change your mind and we can give you the proper paperwork for you to fill out.  Conditions/risks identified: Recommend healthy eating and physical activity for desired weight loss.   Next appointment: Follow up in one year for your annual wellness visit    Preventive Care 65 Years and Older, Female Preventive care refers to lifestyle choices and visits with your health care provider that can promote health and wellness. What does preventive care include?  A yearly physical exam. This is also called an annual well check.  Dental exams once or twice a year.  Routine eye exams. Ask your health care provider how often you should have your eyes checked.  Personal lifestyle choices, including:  Daily care of your teeth and gums.  Regular physical activity.  Eating a healthy diet.  Avoiding tobacco and drug use.  Limiting alcohol use.  Practicing safe sex.  Taking low-dose aspirin every day.  Taking vitamin and mineral supplements as recommended by your health care provider. What happens during an  annual well check? The services and screenings done by your health care provider during your annual well check will depend on your age, overall health, lifestyle risk factors, and family history of disease. Counseling  Your health care provider may ask you questions about your:  Alcohol use.  Tobacco use.  Drug use.  Emotional well-being.  Home and relationship well-being.  Sexual activity.  Eating habits.  History of falls.  Memory and ability to understand (cognition).  Work and work Statistician.  Reproductive health. Screening  You may have the following tests or measurements:  Height, weight, and BMI.  Blood pressure.  Lipid and cholesterol levels. These may be checked every 5 years, or more frequently if you are over 6 years old.  Skin check.  Lung cancer screening. You may have this screening every year starting at age 61 if you have a 30-pack-year history of smoking and currently smoke or have quit within the past 15 years.  Fecal occult blood test (FOBT) of the stool. You may have this test every year starting at age 8.  Flexible sigmoidoscopy or colonoscopy. You may have a sigmoidoscopy every 5 years or a colonoscopy every 10 years starting at age 81.  Hepatitis C blood test.  Hepatitis B blood test.  Sexually transmitted disease (STD) testing.  Diabetes screening. This is done by checking your blood sugar (glucose) after you have not eaten for a while (fasting). You may have this done every 1-3 years.  Bone density scan. This is done to screen for osteoporosis. You may have this done starting at age 95.  Mammogram. This may be done every 1-2 years. Talk to your health care provider about how often you should have regular mammograms.  Talk with your health care provider about your test results, treatment options, and if necessary, the need for more tests. Vaccines  Your health care provider may recommend certain vaccines, such as:  Influenza  vaccine. This is recommended every year.  Tetanus, diphtheria, and acellular pertussis (Tdap, Td) vaccine. You may need a Td booster every 10 years.  Zoster vaccine. You may need this after age 58.  Pneumococcal 13-valent conjugate (PCV13) vaccine. One dose is recommended after age 2.  Pneumococcal polysaccharide (PPSV23) vaccine. One dose is recommended after age 97. Talk to your health care provider about which screenings and vaccines you need and how often you need them. This information is not intended to replace advice given to you by your health care provider. Make sure you discuss any questions you have with your health care provider. Document Released: 07/07/2015 Document Revised: 02/28/2016 Document Reviewed: 04/11/2015 Elsevier Interactive Patient Education  2017 [employer] Prevention in the Home Falls can cause injuries. They can happen to people of all ages. There are many things you can do to make your home safe and to help prevent falls. What can I do on the outside of my home?  Regularly fix the edges of walkways and driveways and fix any cracks.  Remove anything that might make you trip as you walk through a door, such as a raised step or threshold.  Trim any bushes or trees on the path to your home.  Use bright outdoor lighting.  Clear any walking paths of anything that might make someone trip, such as rocks or tools.  Regularly check to see if handrails are loose or broken. Make sure that both sides of any steps have handrails.  Any raised decks and porches should have guardrails on the edges.  Have any leaves, snow, or ice cleared regularly.  Use sand or salt on walking paths during winter.  Clean up any spills in your garage right away. This includes oil or grease spills. What can I do in the bathroom?  Use night lights.  Install grab bars by the toilet and in the tub and shower. Do not use towel bars as grab bars.  Use non-skid mats or decals  in the tub or shower.  If you need to sit down in the shower, use a plastic, non-slip stool.  Keep the floor dry. Clean up any water that spills on the floor as soon as it happens.  Remove soap buildup in the tub or shower regularly.  Attach bath mats securely with double-sided non-slip rug tape.  Do not have throw rugs and other things on the floor that can make you trip. What can I do in the bedroom?  Use night lights.  Make sure that you have a light by your bed that is easy to reach.  Do not use any sheets or blankets that are too big for your bed. They should not hang down onto the floor.  Have a firm chair that has side arms. You can use this for support while you get dressed.  Do not have throw rugs and other things on the floor that can make you trip. What can I do in the kitchen?  Clean up any spills right away.  Avoid walking on wet floors.  Keep items that you use a lot in easy-to-reach places.  If you need to reach something above you, use a strong step stool that has a grab bar.  Keep electrical cords out of the way.  Do not use floor polish or wax that makes floors slippery. If you must use wax, use non-skid floor wax.  Do not have throw rugs and other things on the floor that can make you trip. What can I do with my stairs?  Do not leave any items on the stairs.  Make sure that there are handrails on both sides of the stairs and use them. Fix handrails that are broken or loose. Make sure that handrails are as long as the stairways.  Check any carpeting to make sure that it is firmly attached to the stairs. Fix any carpet that is loose or worn.  Avoid having throw rugs at the top or bottom of the stairs. If you do have throw rugs, attach them to the floor with carpet tape.  Make sure that you have a light switch at the top of the stairs and the bottom of the stairs. If you do not have them, ask someone to add them for you. What else can I do to help  prevent falls?  Wear shoes that:  Do not have high heels.  Have rubber bottoms.  Are comfortable and fit you well.  Are closed at the toe. Do not wear sandals.  If you use a stepladder:  Make sure that it is fully opened. Do not climb a closed stepladder.  Make sure that both sides of the stepladder are locked into place.  Ask someone to hold it for you, if possible.  Clearly mark and make sure that you can see:  Any grab bars or handrails.  First and last steps.  Where the edge of each step is.  Use tools that help you move around (mobility aids) if they are needed. These include:  Canes.  Walkers.  Scooters.  Crutches.  Turn on the lights when you go into a dark area. Replace any light bulbs as soon as they burn out.  Set up your furniture so you have a clear path. Avoid moving your furniture around.  If any of your floors are uneven, fix them.  If there are any pets around you, be aware of where they are.  Review your medicines with your doctor. Some medicines can make you feel dizzy. This can increase your chance of falling. Ask your doctor what other things that you can do to help prevent falls. This information is not intended to replace advice given to you by your health care provider. Make sure you discuss any questions you have with your health care provider. Document Released: 04/06/2009 Document Revised: 11/16/2015 Document Reviewed: 07/15/2014 Elsevier Interactive Patient Education  2017 [employer].
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
## 2020-10-10 ENCOUNTER — Telehealth: Payer: Self-pay

## 2020-10-10 ENCOUNTER — Ambulatory Visit: Payer: Self-pay

## 2020-10-10 DIAGNOSIS — R809 Proteinuria, unspecified: Secondary | ICD-10-CM

## 2020-10-10 DIAGNOSIS — I10 Essential (primary) hypertension: Secondary | ICD-10-CM

## 2020-10-10 NOTE — Telephone Encounter (Signed)
Error. Please disregard

## 2020-10-10 NOTE — Chronic Care Management (AMB) (Signed)
  Chronic Care Management   Outreach Note  10/10/2020 Name: Taylor Perry MRN: [id number] DOB: 1950/09/05    Primary Care Provider: Steele Sizer, MD Reason for referral : Chronic Care Management   [name] was referred to the case management team for assistance with care management and care coordination. She reports doing well today. Declined need for care management outreach. She agreed to call or update her primary care provider if her health condition changes and outreach is required. The care management team will gladly assist.    Follow Up Plan:  [name] declined need for care management outreach. The care management team will gladly assist if she requires outreach in the future.     [facility] Care Management [facility] [phone]

## 2020-11-09 DIAGNOSIS — H25011 Cortical age-related cataract, right eye: Secondary | ICD-10-CM | POA: Diagnosis not present

## 2020-11-09 LAB — HM DIABETES EYE EXAM

## 2020-11-16 DIAGNOSIS — M059 Rheumatoid arthritis with rheumatoid factor, unspecified: Secondary | ICD-10-CM | POA: Diagnosis not present

## 2020-11-16 DIAGNOSIS — Z79899 Other long term (current) drug therapy: Secondary | ICD-10-CM | POA: Diagnosis not present

## 2020-11-16 DIAGNOSIS — L409 Psoriasis, unspecified: Secondary | ICD-10-CM | POA: Diagnosis not present

## 2020-11-16 DIAGNOSIS — L4050 Arthropathic psoriasis, unspecified: Secondary | ICD-10-CM | POA: Diagnosis not present
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
## 2020-11-22 ENCOUNTER — Encounter: Payer: Self-pay | Admitting: Family Medicine

## 2020-11-22 ENCOUNTER — Ambulatory Visit (INDEPENDENT_AMBULATORY_CARE_PROVIDER_SITE_OTHER): Payer: Medicare Other | Admitting: Family Medicine

## 2020-11-22 ENCOUNTER — Other Ambulatory Visit: Payer: Self-pay

## 2020-11-22 VITALS — BP 120/78 | HR 98 | Temp 97.8°F | Resp 16 | Ht 67.0 in | Wt 287.0 lb

## 2020-11-22 DIAGNOSIS — E1129 Type 2 diabetes mellitus with other diabetic kidney complication: Secondary | ICD-10-CM

## 2020-11-22 DIAGNOSIS — R809 Proteinuria, unspecified: Secondary | ICD-10-CM | POA: Diagnosis not present

## 2020-11-22 DIAGNOSIS — L4050 Arthropathic psoriasis, unspecified: Secondary | ICD-10-CM | POA: Diagnosis not present

## 2020-11-22 DIAGNOSIS — E1169 Type 2 diabetes mellitus with other specified complication: Secondary | ICD-10-CM

## 2020-11-22 DIAGNOSIS — E559 Vitamin D deficiency, unspecified: Secondary | ICD-10-CM | POA: Diagnosis not present

## 2020-11-22 DIAGNOSIS — M05741 Rheumatoid arthritis with rheumatoid factor of right hand without organ or systems involvement: Secondary | ICD-10-CM | POA: Diagnosis not present

## 2020-11-22 DIAGNOSIS — I10 Essential (primary) hypertension: Secondary | ICD-10-CM | POA: Diagnosis not present

## 2020-11-22 DIAGNOSIS — J3089 Other allergic rhinitis: Secondary | ICD-10-CM | POA: Diagnosis not present

## 2020-11-22 DIAGNOSIS — M059 Rheumatoid arthritis with rheumatoid factor, unspecified: Secondary | ICD-10-CM

## 2020-11-22 DIAGNOSIS — E032 Hypothyroidism due to medicaments and other exogenous substances: Secondary | ICD-10-CM

## 2020-11-22 DIAGNOSIS — Q752 Hypertelorism: Secondary | ICD-10-CM

## 2020-11-22 DIAGNOSIS — D638 Anemia in other chronic diseases classified elsewhere: Secondary | ICD-10-CM

## 2020-11-22 DIAGNOSIS — M05742 Rheumatoid arthritis with rheumatoid factor of left hand without organ or systems involvement: Secondary | ICD-10-CM

## 2020-11-22 DIAGNOSIS — E890 Postprocedural hypothyroidism: Secondary | ICD-10-CM

## 2020-11-22 DIAGNOSIS — E785 Hyperlipidemia, unspecified: Secondary | ICD-10-CM | POA: Diagnosis not present

## 2020-11-22 LAB — POCT GLYCOSYLATED HEMOGLOBIN (HGB A1C): Hemoglobin A1C: 5.8 % — AB (ref 4.0–5.6)

## 2020-11-24 ENCOUNTER — Encounter: Payer: Self-pay | Admitting: Family Medicine

## 2020-12-26 ENCOUNTER — Other Ambulatory Visit: Payer: Self-pay | Admitting: Family Medicine

## 2020-12-26 DIAGNOSIS — R809 Proteinuria, unspecified: Secondary | ICD-10-CM

## 2020-12-26 DIAGNOSIS — E1169 Type 2 diabetes mellitus with other specified complication: Secondary | ICD-10-CM

## 2020-12-26 DIAGNOSIS — I10 Essential (primary) hypertension: Secondary | ICD-10-CM

## 2021-02-02 DIAGNOSIS — Z23 Encounter for immunization: Secondary | ICD-10-CM | POA: Diagnosis not present

## 2021-03-20 DIAGNOSIS — Z79899 Other long term (current) drug therapy: Secondary | ICD-10-CM | POA: Diagnosis not present

## 2021-03-20 DIAGNOSIS — M170 Bilateral primary osteoarthritis of knee: Secondary | ICD-10-CM | POA: Diagnosis not present

## 2021-03-20 DIAGNOSIS — M059 Rheumatoid arthritis with rheumatoid factor, unspecified: Secondary | ICD-10-CM | POA: Diagnosis not present

## 2021-03-20 DIAGNOSIS — L4050 Arthropathic psoriasis, unspecified: Secondary | ICD-10-CM | POA: Diagnosis not present

## 2021-03-20 DIAGNOSIS — L409 Psoriasis, unspecified: Secondary | ICD-10-CM | POA: Diagnosis not present

## 2021-04-16 ENCOUNTER — Other Ambulatory Visit: Payer: Self-pay | Admitting: Family Medicine

## 2021-04-16 DIAGNOSIS — E890 Postprocedural hypothyroidism: Secondary | ICD-10-CM

## 2021-04-17 NOTE — Telephone Encounter (Signed)
Requested Prescriptions  Pending Prescriptions Disp Refills  . levothyroxine (SYNTHROID) 175 MCG tablet [Pharmacy Med Name: MYLAN-LEVOTHYROX 175MCG TABLET] 90 tablet 1    Sig: TAKE 1 TABLET BY MOUTH  DAILY BEFORE BREAKFAST     Endocrinology:  Hypothyroid Agents Failed - 04/16/2021  8:28 PM      Failed - TSH needs to be rechecked within 3 months after an abnormal result. Refill until TSH is due.      Passed - TSH in normal range and within 360 days    TSH  Date Value Ref Range Status  07/25/2020 0.50 0.40 - 4.50 mIU/L Final         Passed - Valid encounter within last 12 months    Recent Outpatient Visits          4 months ago Controlled type 2 diabetes mellitus with microalbuminuria, without long-term current use of insulin [facility])   [facility] [place], Drue Stager, MD   8 months ago Dyslipidemia associated with type 2 diabetes mellitus [facility])   [facility] [place], Drue Stager, MD   1 year ago Dyslipidemia associated with type 2 diabetes mellitus [facility])   [facility] [place], Drue Stager, MD   1 year ago Dyslipidemia associated with type 2 diabetes mellitus [facility])   [facility] Steele Sizer, MD   1 year ago Controlled type 2 diabetes mellitus with microalbuminuria, without long-term current use of insulin [facility])   [facility] Steele Sizer, MD      Future Appointments            In 1 month Ancil Boozer, Drue Stager, MD [facility], [place]   In 5 months  [facility], [facility]

## 2021-04-24 ENCOUNTER — Ambulatory Visit (INDEPENDENT_AMBULATORY_CARE_PROVIDER_SITE_OTHER): Payer: Medicare Other | Admitting: Emergency Medicine

## 2021-04-24 ENCOUNTER — Other Ambulatory Visit: Payer: Self-pay

## 2021-04-24 DIAGNOSIS — Z23 Encounter for immunization: Secondary | ICD-10-CM

## 2021-04-24 NOTE — Progress Notes (Signed)
Uad
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
## 2021-05-28 ENCOUNTER — Encounter: Payer: Self-pay | Admitting: Family Medicine

## 2021-05-28 ENCOUNTER — Telehealth: Payer: Self-pay | Admitting: Family Medicine

## 2021-05-28 ENCOUNTER — Ambulatory Visit (INDEPENDENT_AMBULATORY_CARE_PROVIDER_SITE_OTHER): Payer: Medicare Other | Admitting: Family Medicine

## 2021-05-28 ENCOUNTER — Other Ambulatory Visit: Payer: Self-pay

## 2021-05-28 VITALS — BP 120/82 | HR 86 | Temp 97.7°F | Resp 16 | Ht 67.0 in | Wt 282.0 lb

## 2021-05-28 DIAGNOSIS — Z23 Encounter for immunization: Secondary | ICD-10-CM | POA: Diagnosis not present

## 2021-05-28 DIAGNOSIS — J3089 Other allergic rhinitis: Secondary | ICD-10-CM

## 2021-05-28 DIAGNOSIS — E785 Hyperlipidemia, unspecified: Secondary | ICD-10-CM | POA: Diagnosis not present

## 2021-05-28 DIAGNOSIS — E1129 Type 2 diabetes mellitus with other diabetic kidney complication: Secondary | ICD-10-CM

## 2021-05-28 DIAGNOSIS — E1169 Type 2 diabetes mellitus with other specified complication: Secondary | ICD-10-CM

## 2021-05-28 DIAGNOSIS — I10 Essential (primary) hypertension: Secondary | ICD-10-CM

## 2021-05-28 DIAGNOSIS — Z1231 Encounter for screening mammogram for malignant neoplasm of breast: Secondary | ICD-10-CM | POA: Diagnosis not present

## 2021-05-28 DIAGNOSIS — R809 Proteinuria, unspecified: Secondary | ICD-10-CM | POA: Diagnosis not present

## 2021-05-28 DIAGNOSIS — N898 Other specified noninflammatory disorders of vagina: Secondary | ICD-10-CM

## 2021-05-28 LAB — POCT GLYCOSYLATED HEMOGLOBIN (HGB A1C): Hemoglobin A1C: 5.7 % — AB (ref 4.0–5.6)

## 2021-05-28 MED ORDER — IRBESARTAN-HYDROCHLOROTHIAZIDE 150-12.5 MG PO TABS: 1.0000 | ORAL_TABLET | Freq: Every day | ORAL | 1 refills | Status: DC

## 2021-05-28 MED ORDER — FLUTICASONE PROPIONATE 50 MCG/ACT NA SUSP: NASAL | 1 refills | Status: DC

## 2021-05-28 MED ORDER — SHINGRIX 50 MCG/0.5ML IM SUSR: 0.5000 mL | Freq: Once | INTRAMUSCULAR | 1 refills | Status: AC

## 2021-05-28 MED ORDER — ROSUVASTATIN CALCIUM 5 MG PO TABS: 5.0000 mg | ORAL_TABLET | Freq: Every day | ORAL | 1 refills | Status: DC

## 2021-05-28 MED ORDER — ROSUVASTATIN CALCIUM 5 MG PO TABS
5.0000 mg | ORAL_TABLET | Freq: Every day | ORAL | 1 refills | Status: DC
Start: 2021-05-28 — End: 2021-05-28

## 2021-05-28 NOTE — Telephone Encounter (Signed)
Pt stated her recent medication was sent to the wrong pharmacy she would like irbesartan-hydrochlorothiazide (AVALIDE) 150-12.5 MG tablet, fluticasone (FLONASE) 50 MCG/ACT nasal spray,rosuvastatin (CRESTOR) 5 MG tablet to go to [facility].    [facility] - [place], [employer] [place] [employer] [facility]  [address] [place] [address] [place] [address]  Phone: [phone] Fax: [phone]
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
## 2021-06-14 ENCOUNTER — Other Ambulatory Visit (HOSPITAL_COMMUNITY)
Admission: RE | Admit: 2021-06-14 | Discharge: 2021-06-14 | Disposition: A | Discharge status: Home / Self Care | Payer: Medicare Other | Source: Ambulatory Visit | Attending: Family Medicine | Admitting: Family Medicine

## 2021-06-14 ENCOUNTER — Encounter: Payer: Self-pay | Admitting: Family Medicine

## 2021-06-14 ENCOUNTER — Ambulatory Visit (INDEPENDENT_AMBULATORY_CARE_PROVIDER_SITE_OTHER): Payer: Medicare Other | Admitting: Family Medicine

## 2021-06-14 ENCOUNTER — Other Ambulatory Visit: Payer: Self-pay

## 2021-06-14 VITALS — BP 118/72 | HR 98 | Temp 97.7°F | Resp 18 | Ht 67.5 in | Wt 278.2 lb

## 2021-06-14 DIAGNOSIS — N950 Postmenopausal bleeding: Secondary | ICD-10-CM | POA: Insufficient documentation

## 2021-06-14 DIAGNOSIS — Z01419 Encounter for gynecological examination (general) (routine) without abnormal findings: Secondary | ICD-10-CM | POA: Diagnosis not present

## 2021-06-14 DIAGNOSIS — R87610 Atypical squamous cells of undetermined significance on cytologic smear of cervix (ASC-US): Secondary | ICD-10-CM | POA: Insufficient documentation

## 2021-06-20 ENCOUNTER — Other Ambulatory Visit: Payer: Self-pay

## 2021-06-20 ENCOUNTER — Ambulatory Visit
Admission: RE | Admit: 2021-06-20 | Discharge: 2021-06-20 | Disposition: A | Discharge status: Home / Self Care | Payer: Medicare Other | Source: Ambulatory Visit | Attending: Family Medicine | Admitting: Family Medicine

## 2021-06-20 DIAGNOSIS — R9389 Abnormal findings on diagnostic imaging of other specified body structures: Secondary | ICD-10-CM | POA: Diagnosis not present

## 2021-06-20 DIAGNOSIS — N950 Postmenopausal bleeding: Secondary | ICD-10-CM | POA: Insufficient documentation

## 2021-06-21 ENCOUNTER — Other Ambulatory Visit: Payer: Self-pay

## 2021-06-21 ENCOUNTER — Telehealth: Payer: Self-pay

## 2021-06-21 NOTE — Telephone Encounter (Signed)
Report received, Spoke with Malachy Mood to confirm receipt.

## 2021-06-21 NOTE — Telephone Encounter (Signed)
Copied from [place] [id number]. Topic: General - Other >> Jun 21, 2021  8:32 AM [place], Museum/gallery conservator R wrote: Reason for CRM: Malachy Mood from [facility] radiology is calling to give call report on pt ultrasound on pelvis, needs a call back even if it was received [phone]

## 2021-06-22 LAB — CYTOLOGY - PAP: Diagnosis: UNDETERMINED — AB

## 2021-07-10 ENCOUNTER — Other Ambulatory Visit (HOSPITAL_COMMUNITY)
Admission: RE | Admit: 2021-07-10 | Discharge: 2021-07-10 | Disposition: A | Discharge status: Home / Self Care | Payer: Medicare Other | Source: Ambulatory Visit | Attending: Obstetrics and Gynecology | Admitting: Obstetrics and Gynecology

## 2021-07-10 ENCOUNTER — Ambulatory Visit (INDEPENDENT_AMBULATORY_CARE_PROVIDER_SITE_OTHER): Payer: Medicare Other | Admitting: Obstetrics and Gynecology

## 2021-07-10 ENCOUNTER — Encounter: Payer: Self-pay | Admitting: Obstetrics and Gynecology

## 2021-07-10 ENCOUNTER — Other Ambulatory Visit: Payer: Self-pay

## 2021-07-10 VITALS — BP 131/82 | HR 96 | Ht 67.0 in | Wt 271.1 lb

## 2021-07-10 DIAGNOSIS — N950 Postmenopausal bleeding: Secondary | ICD-10-CM

## 2021-07-10 NOTE — Addendum Note (Signed)
Addended by: Vernia Buff A on: 07/10/2021 03:26 PM   Modules accepted: Orders

## 2021-07-10 NOTE — Progress Notes (Signed)
HPI:      [name] is a 71 y.o. No obstetric history on file. who LMP was No LMP recorded. Patient is postmenopausal.  Subjective:   She presents today as referral for postmenopausal bleeding.  She has been having irregular spotting off and on for approximately 2 months.  She underwent an ultrasound which showed what appears to be an abnormality in the cervix or lower uterine segment as well as some fluid collection/blood in the upper endometrium. Today she reports that she continues to have spotting.    Hx: The following portions of the patient's history were reviewed and updated as appropriate:             She  has a past medical history of Abnormal mammogram, Adult hypothyroidism, Diabetes mellitus without complication ([place]), Dyslipidemia, History of shingles, History of thyroid irradiation, Hyperlipidemia, Hypertelorism, Hypertension, Microalbuminuria, Morbid obesity with BMI of 40.0-44.9, adult ([place]), Osteoarthritis of lower leg, localized, Pain in finger of right hand, Psoriasis, Pterygium, and Vitamin D deficiency. She does not have any pertinent problems on file. She  has a past surgical history that includes Knee surgery (Left). Her family history includes Bone cancer in her brother; Chronic Renal Failure in her mother. She  reports that she has never smoked. She has never used smokeless tobacco. She reports that she does not drink alcohol and does not use drugs. She has a current medication list which includes the following prescription(s): accu-chek aviva plus, accu-chek softclix lancets, adalimumab, aspirin, calcium carbonate-vitamin d, fluticasone, irbesartan-hydrochlorothiazide, levothyroxine, loratadine, womens multivitamin, and rosuvastatin. She is allergic to atorvastatin.       Review of Systems:  Review of Systems  Constitutional: Denied constitutional symptoms, night sweats, recent illness, fatigue, fever, insomnia and weight loss.  Eyes: Denied eye symptoms,  eye pain, photophobia, vision change and visual disturbance.  Ears/Nose/Throat/Neck: Denied ear, nose, throat or neck symptoms, hearing loss, nasal discharge, sinus congestion and sore throat.  Cardiovascular: Denied cardiovascular symptoms, arrhythmia, chest pain/pressure, edema, exercise intolerance, orthopnea and palpitations.  Respiratory: Denied pulmonary symptoms, asthma, pleuritic pain, productive sputum, cough, dyspnea and wheezing.  Gastrointestinal: Denied, gastro-esophageal reflux, melena, nausea and vomiting.  Genitourinary: See HPI for additional information.  Musculoskeletal: Denied musculoskeletal symptoms, stiffness, swelling, muscle weakness and myalgia.  Dermatologic: Denied dermatology symptoms, rash and scar.  Neurologic: Denied neurology symptoms, dizziness, headache, neck pain and syncope.  Psychiatric: Denied psychiatric symptoms, anxiety and depression.  Endocrine: Denied endocrine symptoms including hot flashes and night sweats.   Meds:   Current Outpatient Medications on File Prior to Visit  Medication Sig Dispense Refill   ACCU-CHEK AVIVA PLUS test strip      Accu-Chek Softclix Lancets lancets      Adalimumab 40 MG/0.4ML PNKT Inject 1 each into the skin every 14 (fourteen) days.     aspirin 81 MG tablet      Calcium Carbonate-Vitamin D 600-200 MG-UNIT TABS      fluticasone (FLONASE) 50 MCG/ACT nasal spray SPRAY 2 SPRAYS INTO EACH NOSTRIL EVERY DAY 48 mL 1   irbesartan-hydrochlorothiazide (AVALIDE) 150-12.5 MG tablet Take 1 tablet by mouth daily. for blood pressure 90 tablet 1   levothyroxine (SYNTHROID) 175 MCG tablet TAKE 1 TABLET BY MOUTH  DAILY BEFORE BREAKFAST 90 tablet 1   loratadine (CLARITIN) 10 MG tablet Take 1 tablet (10 mg total) by mouth daily. 90 tablet 3   Multiple Vitamins-Minerals (WOMENS MULTIVITAMIN) TABS Take by mouth.     rosuvastatin (CRESTOR) 5 MG tablet Take 1 tablet (  5 mg total) by mouth daily. 90 tablet 1   No current  facility-administered medications on file prior to visit.      Objective:     Vitals:   07/10/21 1459  BP: 131/82  Pulse: 96   Filed Weights   07/10/21 1459  Weight: 271 lb 1.6 oz (123 kg)              Physical examination   Pelvic:   Vulva: Normal appearance.  No lesions.  Vagina: No lesions or abnormalities noted.  Support: Normal pelvic support.  Urethra No masses tenderness or scarring.  Meatus Normal size without lesions or prolapse.  Cervix: Normal appearance.  No lesions.  Anus: Normal exam.  No lesions.  Perineum: Normal exam.  No lesions.        Bimanual   Uterus: Normal size.  Non-tender.  Mobile.  AV.  Adnexae: No masses.  Non-tender to palpation.  Cul-de-sac: Negative for abnormality.   Endometrial Biopsy After discussion with the patient regarding her abnormal uterine bleeding I recommended that she proceed with an endometrial biopsy for further diagnosis. The risks, benefits, alternatives, and indications for an endometrial biopsy were discussed with the patient in detail. She understood the risks including infection, bleeding, cervical laceration and uterine perforation.  Verbal consent was obtained.   PROCEDURE NOTE:  Vacurette endometrial biopsy was performed using aseptic technique with iodine preparation.  The uterus was sounded to a length of 6 cm.  Adequate sampling was obtained with minimal blood loss.  The patient tolerated the procedure well.  Disposition will be pending pathology           Assessment:    No obstetric history on file. Patient Active Problem List   Diagnosis Date Noted   Seropositive rheumatoid arthritis ([place]) 07/16/2018   Psoriatic arthritis ([place]) 07/16/2018   Osteoarthritis of both knees 03/04/2018   Morbid obesity with BMI of 40.0-44.9, adult ([place]) 02/17/2018   Anemia of chronic disease 10/23/2017   Benign cyst of right breast 08/18/2015   Controlled type 2 diabetes mellitus with microalbuminuria, without long-term current  use of insulin ([place]) 07/14/2015   Perennial allergic rhinitis 05/26/2015   Benign essential HTN 03/12/2015   Dyslipidemia 03/12/2015   History of shingles 03/12/2015   History of radioactive iodine thyroid ablation 03/12/2015   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   Microalbuminuria 03/12/2015   Localized osteoarthrosis, lower leg 03/12/2015   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   Vitamin D deficiency 03/12/2015     1. Postmenopausal bleeding        Plan:            1.  Biopsy performed-await results.  We will contact patient with results and discussed further work-up and management as necessary via video visit if possible.  Orders No orders of the defined types were placed in this encounter.   No orders of the defined types were placed in this encounter.     F/U  Return for We will contact her with any abnormal test results. I spent 31 minutes involved in the care of this patient preparing to see the patient by obtaining and reviewing her medical history (including labs, imaging tests and prior procedures), documenting clinical information in the electronic health record (EHR), counseling and coordinating care plans, writing and sending prescriptions, ordering tests or procedures and in direct communicating with the patient and medical staff discussing pertinent items from her history and physical exam.  Finis Bud, M.D. 07/10/2021 3:15 PM

## 2021-07-13 ENCOUNTER — Telehealth: Payer: Self-pay

## 2021-07-13 NOTE — Telephone Encounter (Signed)
Dr. Saralyn Pilar called from [facility] about the endometrial biopsy that was done on her and the results show poorly differentiated adenocarcinoma. The report should arrive soon. Please advise.  CB

## 2021-07-17 NOTE — Telephone Encounter (Signed)
Left message for pt to call back to schedule.

## 2021-07-18 ENCOUNTER — Encounter: Payer: Self-pay | Admitting: Family Medicine

## 2021-07-18 NOTE — Telephone Encounter (Signed)
Patient has been called. Has an appointment with Dr. Amalia Hailey. Still needs GYN/Onc appointment.

## 2021-07-19 NOTE — Telephone Encounter (Signed)
Faxed Referral - spoke with Zack verified pt information, faxed directly to New pt fax- will follow up to see if pt is scheduled- requested apt to be after review with Dr.Evans on 2/7/253

## 2021-07-24 ENCOUNTER — Telehealth: Payer: Self-pay

## 2021-07-24 NOTE — Telephone Encounter (Signed)
Referral received for poorly differentiated adenocarcinoma with mucinous features on an endometrial biopsy. I have spoken to Taylor Perry and she is scheduled to see gyn oncology 07/25/21 at 1100. She is familiar with our location as she has seen Dr. Tasia Catchings previously for anemia.

## 2021-07-25 ENCOUNTER — Other Ambulatory Visit: Payer: Self-pay

## 2021-07-25 ENCOUNTER — Inpatient Hospital Stay: Payer: Medicare Other

## 2021-07-25 ENCOUNTER — Inpatient Hospital Stay: Payer: Medicare Other | Attending: Obstetrics and Gynecology | Admitting: Obstetrics and Gynecology

## 2021-07-25 VITALS — BP 132/82 | HR 100 | Temp 98.7°F | Resp 20 | Wt 276.4 lb

## 2021-07-25 DIAGNOSIS — I129 Hypertensive chronic kidney disease with stage 1 through stage 4 chronic kidney disease, or unspecified chronic kidney disease: Secondary | ICD-10-CM | POA: Diagnosis not present

## 2021-07-25 DIAGNOSIS — M059 Rheumatoid arthritis with rheumatoid factor, unspecified: Secondary | ICD-10-CM | POA: Insufficient documentation

## 2021-07-25 DIAGNOSIS — E785 Hyperlipidemia, unspecified: Secondary | ICD-10-CM | POA: Insufficient documentation

## 2021-07-25 DIAGNOSIS — Z79899 Other long term (current) drug therapy: Secondary | ICD-10-CM | POA: Insufficient documentation

## 2021-07-25 DIAGNOSIS — C541 Malignant neoplasm of endometrium: Secondary | ICD-10-CM | POA: Insufficient documentation

## 2021-07-25 DIAGNOSIS — R809 Proteinuria, unspecified: Secondary | ICD-10-CM | POA: Insufficient documentation

## 2021-07-25 DIAGNOSIS — R970 Elevated carcinoembryonic antigen [CEA]: Secondary | ICD-10-CM | POA: Diagnosis not present

## 2021-07-25 DIAGNOSIS — E039 Hypothyroidism, unspecified: Secondary | ICD-10-CM | POA: Diagnosis not present

## 2021-07-25 DIAGNOSIS — L4050 Arthropathic psoriasis, unspecified: Secondary | ICD-10-CM | POA: Diagnosis not present

## 2021-07-25 DIAGNOSIS — E119 Type 2 diabetes mellitus without complications: Secondary | ICD-10-CM | POA: Diagnosis not present

## 2021-07-25 DIAGNOSIS — E559 Vitamin D deficiency, unspecified: Secondary | ICD-10-CM | POA: Diagnosis not present

## 2021-07-25 DIAGNOSIS — N189 Chronic kidney disease, unspecified: Secondary | ICD-10-CM | POA: Insufficient documentation

## 2021-07-25 DIAGNOSIS — Q752 Hypertelorism: Secondary | ICD-10-CM | POA: Diagnosis not present

## 2021-07-25 DIAGNOSIS — I10 Essential (primary) hypertension: Secondary | ICD-10-CM | POA: Insufficient documentation

## 2021-07-25 LAB — CBC WITH DIFFERENTIAL/PLATELET
Abs Immature Granulocytes: 0.01 10*3/uL (ref 0.00–0.07)
Basophils Absolute: 0 10*3/uL (ref 0.0–0.1)
Basophils Relative: 0 %
Eosinophils Absolute: 0.1 10*3/uL (ref 0.0–0.5)
Eosinophils Relative: 2 %
HCT: 36.4 % (ref 36.0–46.0)
Hemoglobin: 11.8 g/dL — ABNORMAL LOW (ref 12.0–15.0)
Immature Granulocytes: 0 %
Lymphocytes Relative: 29 %
Lymphs Abs: 1.4 10*3/uL (ref 0.7–4.0)
MCH: 27.7 pg (ref 26.0–34.0)
MCHC: 32.4 g/dL (ref 30.0–36.0)
MCV: 85.4 fL (ref 80.0–100.0)
Monocytes Absolute: 0.7 10*3/uL (ref 0.1–1.0)
Monocytes Relative: 13 %
Neutro Abs: 2.7 10*3/uL (ref 1.7–7.7)
Neutrophils Relative %: 56 %
Platelets: 206 10*3/uL (ref 150–400)
RBC: 4.26 MIL/uL (ref 3.87–5.11)
RDW: 14.2 % (ref 11.5–15.5)
WBC: 4.9 10*3/uL (ref 4.0–10.5)
nRBC: 0 % (ref 0.0–0.2)

## 2021-07-25 LAB — COMPREHENSIVE METABOLIC PANEL
ALT: 26 U/L (ref 0–44)
AST: 35 U/L (ref 15–41)
Albumin: 3.9 g/dL (ref 3.5–5.0)
Alkaline Phosphatase: 74 U/L (ref 38–126)
Anion gap: 10 (ref 5–15)
BUN: 16 mg/dL (ref 8–23)
CO2: 24 mmol/L (ref 22–32)
Calcium: 9.3 mg/dL (ref 8.9–10.3)
Chloride: 103 mmol/L (ref 98–111)
Creatinine, Ser: 1.04 mg/dL — ABNORMAL HIGH (ref 0.44–1.00)
GFR, Estimated: 59 mL/min — ABNORMAL LOW (ref >60)
Glucose, Bld: 105 mg/dL — ABNORMAL HIGH (ref 70–99)
Potassium: 3.7 mmol/L (ref 3.5–5.1)
Sodium: 137 mmol/L (ref 135–145)
Total Bilirubin: 0.6 mg/dL (ref 0.3–1.2)
Total Protein: 7.9 g/dL (ref 6.5–8.1)

## 2021-07-25 LAB — TSH: TSH: 0.205 u[IU]/mL — ABNORMAL LOW (ref 0.350–4.500)

## 2021-07-25 NOTE — Progress Notes (Signed)
Pre and post operative teaching completed. Provided printed copy of education. Encouraged to call with any questions.  Mariea Clonts, RN
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
## 2021-07-25 NOTE — Patient Instructions (Signed)
You will be contacted with a pre-admit testing appointment. This will be a phone visit. We will see you 4-6 weeks after surgery.      DIVISION OF GYNECOLOGIC ONCOLOGY BOWEL PREP   The following instructions are extremely important to prepare for your surgery. Please follow them carefully   Step 1: Liquid Diet Instructions              Clear Liquid Diet for GYN Oncology Patients Day Before Surgery The day before your scheduled surgery DO NOT EAT any solid foods.  We do want you to drink enough liquids, but NO MILK products.  We do not want you to be dehydrated.  Clear liquids are defined as no milk products and no pieces of any solid food. Drink at least 64 oz. of fluid.  The following are all approved for you to drink the day before you surgery. Chicken, Beef or Vegetable Broth (bouillon or consomm) - NO BROTH AFTER MIDNIGHT Plain Jello  (no fruit) Water Strained lemonade or fruit punch Gatorade (any flavor) CLEAR Ensure or Boost Breeze Fruit juices without pulp, such as apple, grape, or cranberry juice Clear sodas - NO SODA AFTER MIDNIGHT Ice Pops without bits of fruit or fruit pulp Honey Tea or coffee without milk or cream                 Any foods not on the above list should be avoided                                                                                             Step 2: Laxatives           The evening before surgery:   Time: around 5pm   Follow these instructions carefully.   Administer 1 Dulcolax suppository according to manufacturer instructions on the box. You will need to purchase this laxative at a pharmacy or grocery store.    Individual responses to laxatives vary; this prep may cause multiple bowel movements. It often works in 30 minutes and may take as long as 3 hours. Stay near an available bathroom.  You may also use the fleets enema as directed by pre-admit testing  (DO NOT USE BOTH)   It is important to stay hydrated. Ensure you are  still drinking clear liquids.  Do Drink ENSURE for OR as directed by Pre-admit testing     IMPORTANT: FOR YOUR SAFETY, WE WILL HAVE TO CANCEL YOUR SURGERY IF YOU DO NOT FOLLOW THESE INSTRUCTIONS.   Do not eat anything after midnight (including gum or candy) prior to your surgery. Avoid drinking carbonated beverages after midnight. You can have clear liquids up until one hour before you arrive at the hospital. Nothing by mouth means no liquids, gum, candy, etc for one hour before your arrival time.       Laparoscopy Laparoscopy is a procedure to diagnose diseases in the abdomen. During the procedure, a thin, lighted, pencil-sized instrument called a laparoscope is inserted into the abdomen through an incision. The laparoscope allows your health care provider to look at the organs inside your body. [place]  CARE PROVIDER KNOW ABOUT: Any allergies you have. All medicines you are taking, including vitamins, herbs, eye drops, creams, and over-the-counter medicines. Previous problems you or members of your family have had with the use of anesthetics. Any blood disorders you have. Previous surgeries you have had. Medical conditions you have. RISKS AND COMPLICATIONS  Generally, this is a safe procedure. However, problems can occur, which may include: Infection. Bleeding. Damage to other organs. Allergic reaction to the anesthetics used during the procedure. BEFORE THE PROCEDURE Do not eat or drink anything after midnight on the night before the procedure or as directed by your health care provider. Ask your health care provider about: Changing or stopping your regular medicines. Taking medicines such as aspirin and ibuprofen. These medicines can thin your blood. Do not take these medicines before your procedure if your health care provider instructs you not to. Plan to have someone take you home after the procedure. PROCEDURE You may be given a medicine to help you relax  (sedative). You will be given a medicine to make you sleep (general anesthetic). Your abdomen will be inflated with a gas. This will make your organs easier to see. Small incisions will be made in your abdomen. A laparoscope and other small instruments will be inserted into the abdomen through the incisions. A tissue sample may be removed from an organ in the abdomen for examination. The instruments will be removed from the abdomen. The gas will be released. The incisions will be closed with stitches (sutures). AFTER THE PROCEDURE  Your blood pressure, heart rate, breathing rate, and blood oxygen level will be monitored often until the medicines you were given have worn off.   This information is not intended to replace advice given to you by your health care provider. Make sure you discuss any questions you have with your health care provider.                                             Bowel Symptoms After Surgery After gynecologic surgery, women often have temporary changes in bowel function (constipation and gas pain).  Following are tips to help prevent and treat common bowel problems.  It also tells you when to call the doctor.  This is important because some symptoms might be a sign of a more serious bowel problem such as obstruction (bowel blockage).  These problems are rare but can happen after gynecologic surgery.   Besides surgery, what can temporarily affect bowel function? 1. Dietary changes   2. Decreased physical activity   3.Antibiotics   4. Pain medication   How can I prevent constipation (three days or more without a stool)? Include fiber in your diet: whole grains, raw or dried fruits & vegetables, prunes, prune/pear juiceDrink at least 8 glasses of liquid (preferably water) every day Avoid: Gas forming foods such as broccoli, beans, peas, salads, cabbage, sweet potatoes Greasy, fatty, or fried foods Activity helps bowel function return to normal, walk around the house  at least 3-4 times each day for 15 minutes or longer, if tolerated.  Rocking in a rocking chair is preferable to sitting still. Stool softeners: these are not laxatives, but serve to soften the stool to avoid straining.  Take 2-4 times a day until normal bowel function returns         Examples: Colace or generic equivalent (Docusate) Bulk laxatives: provide  a concentrated source of fiber.  They do not stimulate the bowel.  Take 1-2 times each day until normal bowel function return.              Examples: Citrucel, Metamucil, Fiberal, Fibercon   What can I take for Gas Pains? Simethicone (Mylicon, Gas-X, Maalox-Gas, Mylanta-Gas) take 3-4 times a day Maalox Regular - take 3-4 times a day Mylanta Regular - take 3-4 times a day   What can I take if I become constipated? Start with stool softeners and add additional laxatives below as needed to have a bowel movement every 1-2 days  Stool softeners 1-2 tablets, 2 times a day Senokot 1-2 tablets, 1-2 times a day Glycerin suppository can soften hard stool take once a day Bisacodyl suppository once a day  Milk of Magnesia 30 mL 1-2 times a day Fleets or tap water enema    What can I do for nausea?  Limit most solid foods for 24-48 hours Continue eating small frequent amounts of liquids and/or bland soft foods Toast, crackers, cooked cereal (grits, cream of wheat, rice) Benadryl: a mild anti-nausea medicine can be obtained without a prescription. May cause drowsiness, especially if taken with narcotic pain medicines Contact provider for prescription nausea medication     What can I do, or take for diarrhea (more than five loose stools per day)? Drink plenty of clear fluids to prevent dehydration May take Kaopectate, Pepto-Bismol, Imodium, or probiotics for 1-2 days Anusol or Preparation-H can be helpful for hemorrhoids and irritated tissue around anus   When should I call the doctor?             CONSTIPATION:  Not relieved after three days  following the above program VOMITING: That contains blood, coffee ground material More the three times/hour and unable to keep down nausea medication for more than eight hours With dry mouth, dark or strong urine, feeling light-headed, dizzy, or confused With severe abdominal pain or bloating for more than 24 hours DIARRHEA: That continues for more then 24-48 hours despite treatment That contains blood or tarry material With dry mouth, dark or strong urine, feeling light~headed, dizzy, or confused FEVER: 101 F or higher along with nausea, vomiting, gas pain, diarrhea UNABLE TO: Pass gas from rectum for more than 24 hours Tolerate liquids by mouth for more than 24 hours        Laparoscopic Hysterectomy, Care After Refer to this sheet in the next few weeks. These instructions provide you with information on caring for yourself after your procedure. Your health care provider may also give you more specific instructions. Your treatment has been planned according to current medical practices, but problems sometimes occur. Call your health care provider if you have any problems or questions after your procedure. What can I expect after the procedure? Pain and bruising at the incision sites. You will be given pain medicine to control it. Menopausal symptoms such as hot flashes, night sweats, and insomnia if your ovaries were removed. Sore throat from the breathing tube that was inserted during surgery. Follow these instructions at home: Only take over-the-counter or prescription medicines for pain, discomfort, or fever as directed by your health care provider. Do not take aspirin. It can cause bleeding. Do not drive when taking pain medicine. Follow your health care provider's advice regarding diet, exercise, lifting, driving, and general activities. Resume your usual diet as directed and allowed. Get plenty of rest and sleep. Do not douche, use tampons, or have sexual intercourse  for at  least 6 weeks, or until your health care provider gives you permission. Change your bandages (dressings) as directed by your health care provider. Monitor your temperature and notify your health care provider of a fever. Take showers instead of baths for 2-3 weeks. Do not drink alcohol until your health care provider gives you permission. If you develop constipation, you may take a mild laxative with your health care provider's permission. Bran foods may help with constipation problems. Drinking enough fluids to keep your urine clear or pale yellow may help as well. Try to have someone home with you for 1-2 weeks to help around the house. Keep all of your follow-up appointments as directed by your health care provider. Contact a health care provider if: You have swelling, redness, or increasing pain around your incision sites. You have pus coming from your incision. You notice a bad smell coming from your incision. Your incision breaks open. You feel dizzy or lightheaded. You have pain or bleeding when you urinate. You have persistent diarrhea. You have persistent nausea and vomiting. You have abnormal vaginal discharge. You have a rash. You have any type of abnormal reaction or develop an allergy to your medicine. You have poor pain control with your prescribed medicine. Get help right away if: You have chest pain or shortness of breath. You have severe abdominal pain that is not relieved with pain medicine. You have pain or swelling in your legs. This information is not intended to replace advice given to you by your health care provider. Make sure you discuss any questions you have with your health care provider. Document Released: 03/31/2013 Document Revised: 11/16/2015 Document Reviewed: 12/29/2012 Elsevier Interactive Patient Education  2017 [employer].

## 2021-07-26 ENCOUNTER — Ambulatory Visit
Admission: RE | Admit: 2021-07-26 | Discharge: 2021-07-26 | Disposition: A | Discharge status: Home / Self Care | Payer: Medicare Other | Source: Ambulatory Visit | Attending: Nurse Practitioner | Admitting: Nurse Practitioner

## 2021-07-26 ENCOUNTER — Encounter: Payer: Medicare Other | Admitting: Obstetrics and Gynecology

## 2021-07-26 DIAGNOSIS — C541 Malignant neoplasm of endometrium: Secondary | ICD-10-CM | POA: Insufficient documentation

## 2021-07-26 LAB — HEMOGLOBIN A1C
Hgb A1c MFr Bld: 6.1 % — ABNORMAL HIGH (ref 4.8–5.6)
Mean Plasma Glucose: 128 mg/dL

## 2021-07-26 LAB — CEA: CEA: 1.5 ng/mL (ref 0.0–4.7)

## 2021-07-26 LAB — CA 125: Cancer Antigen (CA) 125: 8.7 U/mL (ref 0.0–38.1)

## 2021-07-26 LAB — GLUCOSE, CAPILLARY: Glucose-Capillary: 97 mg/dL (ref 70–99)

## 2021-07-26 MED ORDER — FLUDEOXYGLUCOSE F - 18 (FDG) INJECTION
14.6000 | Freq: Once | INTRAVENOUS | Status: AC | PRN
  Administered 2021-07-26: 14.6 via INTRAVENOUS

## 2021-07-30 ENCOUNTER — Telehealth: Payer: Self-pay

## 2021-07-30 NOTE — Telephone Encounter (Addendum)
Called [facility] to follow up on HPV being added to endometrial biopsy. It has not been performed. Spoke with Dr. Melina Copa and she will order the hpv.

## 2021-07-30 NOTE — Telephone Encounter (Signed)
Call placed to Ms. Stann Mainland. Left voicemail to return call for update regarding surgery.

## 2021-07-31 ENCOUNTER — Encounter: Payer: Medicare Other | Admitting: Obstetrics and Gynecology

## 2021-08-06 ENCOUNTER — Telehealth: Payer: Self-pay

## 2021-08-06 NOTE — Telephone Encounter (Signed)
Spoke with Dr. Fransisca Connors. We will tentatively scheduled surgery for 08/15/21 while we await hpv testing to result. I have reached out to [facility] to provide update and left a message with Ms. Belcourt to return call for update.

## 2021-08-06 NOTE — Telephone Encounter (Signed)
Call placed to [facility] regarding status of HPV testing. According to [facility], testing is at 40%. Message sent to Dr. Fransisca Connors to follow up on plan for this patient.

## 2021-08-07 ENCOUNTER — Telehealth: Payer: Self-pay

## 2021-08-07 NOTE — Telephone Encounter (Signed)
Received call back from Ms. Stann Mainland. Provided her an update regarding treatment plan.

## 2021-08-08 ENCOUNTER — Telehealth: Payer: Self-pay

## 2021-08-08 NOTE — Telephone Encounter (Signed)
Spoke with [facility] representative. HPV order was received on 2/9. Specimen was received 2/12. Turn around time for this testing is 7 days. Interpretation will be done by [facility]. Rep does think they will be completed by tomorrow and sent to [facility] for interpretation.

## 2021-08-08 NOTE — Telephone Encounter (Signed)
Call placed to [facility]. HPV testing is still pending. I have reached out to our [facility] representative to see if they can give [place] a timeframe for the results. Awaiting response.

## 2021-08-10 ENCOUNTER — Encounter (HOSPITAL_COMMUNITY): Payer: Self-pay

## 2021-08-10 ENCOUNTER — Telehealth: Payer: Self-pay

## 2021-08-10 LAB — SURGICAL PATHOLOGY

## 2021-08-10 NOTE — Telephone Encounter (Signed)
Received notification from Dr. Fransisca Connors that her HPV testing was negative and she may proceed with surgery. PAT testing appointment arranged for 2/20. Called and left voicemail on mobile and home regarding this information. Asked to call  once message received to answer questions regarding surgery.

## 2021-08-13 ENCOUNTER — Other Ambulatory Visit: Payer: Self-pay | Admitting: Obstetrics and Gynecology

## 2021-08-13 ENCOUNTER — Encounter
Admission: RE | Admit: 2021-08-13 | Discharge: 2021-08-13 | Disposition: A | Discharge status: Home / Self Care | Payer: Medicare Other | Source: Ambulatory Visit | Attending: Obstetrics and Gynecology | Admitting: Obstetrics and Gynecology

## 2021-08-13 ENCOUNTER — Other Ambulatory Visit: Payer: Self-pay

## 2021-08-13 DIAGNOSIS — C541 Malignant neoplasm of endometrium: Secondary | ICD-10-CM

## 2021-08-13 HISTORY — DX: Malignant neoplasm of endometrium: C54.1

## 2021-08-13 HISTORY — DX: Arthropathic psoriasis, unspecified: L40.50

## 2021-08-13 HISTORY — DX: Anemia, unspecified: D64.9

## 2021-08-13 MED ORDER — METRONIDAZOLE IVPB CUSTOM: 500.0000 mg | Freq: Once | INTRAVENOUS | Status: DC

## 2021-08-13 NOTE — Patient Instructions (Signed)
Your procedure is scheduled on:08-15-21 Wednesday Report to the Registration Desk on the 1st floor of the [facility].Then proceed to the 2nd floor Surgery Desk in the [facility] To find out your arrival time, please call [phone] between 1PM - 3PM on:08-14-21 Tuesday  REMEMBER: Instructions that are not followed completely may result in serious medical risk, up to and including death; or upon the discretion of your surgeon and anesthesiologist your surgery may need to be rescheduled.  Do not eat food after midnight the night before surgery.  No gum chewing, lozengers or hard candies.  You may however, drink Water up to 2 hours before you are scheduled to arrive for your surgery. Do not drink anything within 2 hours of your scheduled arrival time.  Type 1 and Type 2 diabetics should only drink water.  TAKE THESE MEDICATIONS THE MORNING OF SURGERY WITH A SIP OF WATER: -levothyroxine (SYNTHROID)  -rosuvastatin (CRESTOR)   Stop your aspirin 81 MG tablet NOW (08-13-21)  One week prior to surgery: Stop Anti-inflammatories (NSAIDS) such as Advil, Aleve, Ibuprofen, Motrin, Naproxen, Naprosyn and Aspirin based products such as Excedrin, Goodys Powder, BC Powder NOW (08-13-21) You may however, continue to take Tylenol if needed for pain up until the day of surgery.  Stop ANY OVER THE COUNTER supplements/vitamins NOW (08-13-21) until after surgery (Calcium-Vitamin D, VITAMIN B12 , Fish Oil and Multiple Vitamins)  No Alcohol for 24 hours before or after surgery.  No Smoking including e-cigarettes for 24 hours prior to surgery.  No chewable tobacco products for at least 6 hours prior to surgery.  No nicotine patches on the day of surgery.  Do not use any "recreational" drugs for at least a week prior to your surgery.  Please be advised that the combination of cocaine and anesthesia may have negative outcomes, up to and including death. If you test positive for cocaine, your surgery will be  cancelled.  On the morning of surgery brush your teeth with toothpaste and water, you may rinse your mouth with mouthwash if you wish. Do not swallow any toothpaste or mouthwash.  Use CHG Soap as directed on instruction sheet.  Do not wear jewelry, make-up, hairpins, clips or nail polish.  Do not wear lotions, powders, or perfumes.   Do not shave body from the neck down 48 hours prior to surgery just in case you cut yourself which could leave a site for infection.  Also, freshly shaved skin may become irritated if using the CHG soap.  Contact lenses, hearing aids and dentures may not be worn into surgery.  Do not bring valuables to the hospital. [facility] is not responsible for any missing/lost belongings or valuables.    Notify your doctor if there is any change in your medical condition (cold, fever, infection).  Wear comfortable clothing (specific to your surgery type) to the hospital.  After surgery, you can help prevent lung complications by doing breathing exercises.  Take deep breaths and cough every 1-2 hours. Your doctor may order a device called an Incentive Spirometer to help you take deep breaths. When coughing or sneezing, hold a pillow firmly against your incision with both hands. This is called splinting. Doing this helps protect your incision. It also decreases belly discomfort.  If you are being admitted to the hospital overnight, leave your suitcase in the car. After surgery it may be brought to your room.  If you are being discharged the day of surgery, you will not be allowed to drive  home. You will need a responsible adult (18 years or older) to drive you home and stay with you that night.   If you are taking public transportation, you will need to have a responsible adult (18 years or older) with you. Please confirm with your physician that it is acceptable to use public transportation.   Please call the [place] Dept. at [phone] if  you have any questions about these instructions.  Surgery Visitation Policy:  Patients undergoing a surgery or procedure may have one family member or support person with them as long as that person is not COVID-19 positive or experiencing its symptoms.  That person may remain in the waiting area during the procedure and may rotate out with other people.  Inpatient Visitation:    Visiting hours are 7 a.m. to 8 p.m. Up to two visitors ages 16+ are allowed at one time in a patient room. The visitors may rotate out with other people during the day. Visitors must check out when they leave, or other visitors will not be allowed. One designated support person may remain overnight. The visitor must pass COVID-19 screenings, use hand sanitizer when entering and exiting the patients room and wear a mask at all times, including in the patients room. Patients must also wear a mask when staff or their visitor are in the room. Masking is required regardless of vaccination status.
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
## 2021-08-13 NOTE — Addendum Note (Signed)
Addended by: Charlyn Minerva on: 08/13/2021 12:47 PM   Modules accepted: Orders

## 2021-08-14 ENCOUNTER — Encounter
Admission: RE | Admit: 2021-08-14 | Discharge: 2021-08-14 | Disposition: A | Discharge status: Home / Self Care | Payer: Medicare Other | Source: Ambulatory Visit | Attending: Obstetrics and Gynecology | Admitting: Obstetrics and Gynecology

## 2021-08-14 DIAGNOSIS — I10 Essential (primary) hypertension: Secondary | ICD-10-CM | POA: Insufficient documentation

## 2021-08-14 DIAGNOSIS — Z01810 Encounter for preprocedural cardiovascular examination: Secondary | ICD-10-CM | POA: Insufficient documentation

## 2021-08-15 ENCOUNTER — Inpatient Hospital Stay
Admission: AD | Admit: 2021-08-15 | Discharge: 2021-08-15 | DRG: 740 | Disposition: A | Discharge status: Home / Self Care | Payer: Medicare Other | Attending: Obstetrics and Gynecology | Admitting: Obstetrics and Gynecology

## 2021-08-15 ENCOUNTER — Ambulatory Visit: Payer: Medicare Other | Admitting: Urgent Care

## 2021-08-15 ENCOUNTER — Encounter
Admission: AD | Disposition: A | Discharge status: Home / Self Care | Payer: Self-pay | Source: Home / Self Care | Attending: Obstetrics and Gynecology

## 2021-08-15 ENCOUNTER — Other Ambulatory Visit: Payer: Self-pay

## 2021-08-15 ENCOUNTER — Encounter: Payer: Self-pay | Admitting: Obstetrics and Gynecology

## 2021-08-15 DIAGNOSIS — C541 Malignant neoplasm of endometrium: Secondary | ICD-10-CM

## 2021-08-15 DIAGNOSIS — N736 Female pelvic peritoneal adhesions (postinfective): Secondary | ICD-10-CM | POA: Diagnosis present

## 2021-08-15 DIAGNOSIS — N950 Postmenopausal bleeding: Secondary | ICD-10-CM | POA: Diagnosis present

## 2021-08-15 DIAGNOSIS — M059 Rheumatoid arthritis with rheumatoid factor, unspecified: Secondary | ICD-10-CM | POA: Diagnosis present

## 2021-08-15 DIAGNOSIS — C578 Malignant neoplasm of overlapping sites of female genital organs: Secondary | ICD-10-CM | POA: Diagnosis present

## 2021-08-15 DIAGNOSIS — C55 Malignant neoplasm of uterus, part unspecified: Secondary | ICD-10-CM | POA: Diagnosis not present

## 2021-08-15 DIAGNOSIS — E039 Hypothyroidism, unspecified: Secondary | ICD-10-CM | POA: Diagnosis present

## 2021-08-15 DIAGNOSIS — Z841 Family history of disorders of kidney and ureter: Secondary | ICD-10-CM

## 2021-08-15 DIAGNOSIS — L4050 Arthropathic psoriasis, unspecified: Secondary | ICD-10-CM | POA: Diagnosis present

## 2021-08-15 DIAGNOSIS — E119 Type 2 diabetes mellitus without complications: Secondary | ICD-10-CM | POA: Diagnosis present

## 2021-08-15 DIAGNOSIS — Z808 Family history of malignant neoplasm of other organs or systems: Secondary | ICD-10-CM

## 2021-08-15 DIAGNOSIS — I10 Essential (primary) hypertension: Secondary | ICD-10-CM | POA: Diagnosis present

## 2021-08-15 DIAGNOSIS — E785 Hyperlipidemia, unspecified: Secondary | ICD-10-CM | POA: Diagnosis present

## 2021-08-15 DIAGNOSIS — Z6841 Body Mass Index (BMI) 40.0 and over, adult: Secondary | ICD-10-CM | POA: Diagnosis not present

## 2021-08-15 DIAGNOSIS — C775 Secondary and unspecified malignant neoplasm of intrapelvic lymph nodes: Secondary | ICD-10-CM | POA: Diagnosis present

## 2021-08-15 DIAGNOSIS — E559 Vitamin D deficiency, unspecified: Secondary | ICD-10-CM | POA: Diagnosis present

## 2021-08-15 DIAGNOSIS — C548 Malignant neoplasm of overlapping sites of corpus uteri: Secondary | ICD-10-CM | POA: Diagnosis present

## 2021-08-15 HISTORY — PX: ROBOTIC ASSISTED TOTAL HYSTERECTOMY WITH BILATERAL SALPINGO OOPHERECTOMY: SHX6086

## 2021-08-15 HISTORY — PX: CYSTOSCOPY: SHX5120

## 2021-08-15 LAB — TYPE AND SCREEN
ABO/RH(D): O POS
Antibody Screen: POSITIVE
DAT, IgG: NEGATIVE
DAT, complement: NEGATIVE

## 2021-08-15 LAB — ABO/RH: ABO/RH(D): O POS

## 2021-08-15 LAB — GLUCOSE, CAPILLARY: Glucose-Capillary: 94 mg/dL (ref 70–99)

## 2021-08-15 SURGERY — HYSTERECTOMY, TOTAL, ROBOT-ASSISTED, LAPAROSCOPIC, WITH BILATERAL SALPINGO-OOPHORECTOMY
Anesthesia: General | Laterality: Bilateral

## 2021-08-15 MED ORDER — BUPIVACAINE HCL (PF) 0.5 % IJ SOLN
INTRAMUSCULAR | Status: AC
Start: 2021-08-15 — End: ?
  Filled 2021-08-15: qty 30

## 2021-08-15 MED ORDER — FAMOTIDINE 20 MG PO TABS: 20.0000 mg | ORAL_TABLET | Freq: Once | ORAL | Status: DC

## 2021-08-15 MED ORDER — DEXMEDETOMIDINE (PRECEDEX) IN NS 20 MCG/5ML (4 MCG/ML) IV SYRINGE
PREFILLED_SYRINGE | INTRAVENOUS | Status: DC | PRN
  Administered 2021-08-15: 12 ug via INTRAVENOUS
  Administered 2021-08-15: 8 ug via INTRAVENOUS

## 2021-08-15 MED ORDER — POVIDONE-IODINE 10 % EX SWAB: 2.0000 "application " | Freq: Once | CUTANEOUS | Status: DC

## 2021-08-15 MED ORDER — BUPIVACAINE HCL (PF) 0.5 % IJ SOLN
INTRAMUSCULAR | Status: DC | PRN
  Administered 2021-08-15: 20 mL

## 2021-08-15 MED ORDER — BUPIVACAINE HCL (PF) 0.5 % IJ SOLN
INTRAMUSCULAR | Status: AC
  Filled 2021-08-15: qty 30

## 2021-08-15 MED ORDER — FENTANYL CITRATE (PF) 100 MCG/2ML IJ SOLN
INTRAMUSCULAR | Status: AC
  Filled 2021-08-15: qty 2

## 2021-08-15 MED ORDER — ACETAMINOPHEN 10 MG/ML IV SOLN
INTRAVENOUS | Status: DC | PRN
  Administered 2021-08-15: 1000 mg via INTRAVENOUS

## 2021-08-15 MED ORDER — ACETAMINOPHEN 10 MG/ML IV SOLN
INTRAVENOUS | Status: AC
  Filled 2021-08-15: qty 100

## 2021-08-15 MED ORDER — ONDANSETRON HCL 4 MG/2ML IJ SOLN: 4.0000 mg | Freq: Once | INTRAMUSCULAR | Status: DC | PRN

## 2021-08-15 MED ORDER — OXYCODONE HCL 5 MG PO TABS: 5.0000 mg | ORAL_TABLET | Freq: Three times a day (TID) | ORAL | 0 refills | Status: DC | PRN

## 2021-08-15 MED ORDER — CHLORHEXIDINE GLUCONATE 0.12 % MT SOLN: 15.0000 mL | Freq: Once | OROMUCOSAL | Status: AC

## 2021-08-15 MED ORDER — PHENYLEPHRINE HCL (PRESSORS) 10 MG/ML IV SOLN
INTRAVENOUS | Status: AC
  Filled 2021-08-15: qty 1

## 2021-08-15 MED ORDER — METHYLENE BLUE 0.5 % INJ SOLN
INTRAVENOUS | Status: AC
  Filled 2021-08-15: qty 10

## 2021-08-15 MED ORDER — KETAMINE HCL 50 MG/5ML IJ SOSY
PREFILLED_SYRINGE | INTRAMUSCULAR | Status: AC
  Filled 2021-08-15: qty 5

## 2021-08-15 MED ORDER — KETAMINE HCL 10 MG/ML IJ SOLN
INTRAMUSCULAR | Status: DC | PRN
  Administered 2021-08-15: 30 mg via INTRAVENOUS

## 2021-08-15 MED ORDER — SUGAMMADEX SODIUM 500 MG/5ML IV SOLN
INTRAVENOUS | Status: DC | PRN
  Administered 2021-08-15: 400 mg via INTRAVENOUS

## 2021-08-15 MED ORDER — HEPARIN SODIUM (PORCINE) 5000 UNIT/ML IJ SOLN
INTRAMUSCULAR | Status: AC
  Administered 2021-08-15: 5000 [IU] via SUBCUTANEOUS
  Filled 2021-08-15: qty 1

## 2021-08-15 MED ORDER — ORAL CARE MOUTH RINSE: 15.0000 mL | Freq: Once | OROMUCOSAL | Status: AC

## 2021-08-15 MED ORDER — DEXAMETHASONE SODIUM PHOSPHATE 10 MG/ML IJ SOLN
INTRAMUSCULAR | Status: DC | PRN
  Administered 2021-08-15: 10 mg via INTRAVENOUS

## 2021-08-15 MED ORDER — ACETAMINOPHEN 500 MG PO TABS
1000.0000 mg | ORAL_TABLET | Freq: Four times a day (QID) | ORAL | 1 refills | Status: AC | PRN
Start: 2021-08-15 — End: 2022-08-15

## 2021-08-15 MED ORDER — FAMOTIDINE 20 MG PO TABS
ORAL_TABLET | ORAL | Status: AC
  Filled 2021-08-15: qty 1

## 2021-08-15 MED ORDER — FENTANYL CITRATE (PF) 100 MCG/2ML IJ SOLN
INTRAMUSCULAR | Status: DC | PRN
  Administered 2021-08-15: 25 ug via INTRAVENOUS
  Administered 2021-08-15: 50 ug via INTRAVENOUS
  Administered 2021-08-15: 25 ug via INTRAVENOUS
  Administered 2021-08-15 (×2): 50 ug via INTRAVENOUS

## 2021-08-15 MED ORDER — SODIUM CHLORIDE 0.9 % IR SOLN
Status: DC | PRN
  Administered 2021-08-15: 300 mL

## 2021-08-15 MED ORDER — SEVOFLURANE IN SOLN
RESPIRATORY_TRACT | Status: AC
  Filled 2021-08-15: qty 250

## 2021-08-15 MED ORDER — ROCURONIUM BROMIDE 100 MG/10ML IV SOLN
INTRAVENOUS | Status: DC | PRN
Start: 2021-08-15 — End: 2021-08-15
  Administered 2021-08-15: 20 mg via INTRAVENOUS
  Administered 2021-08-15: 40 mg via INTRAVENOUS
  Administered 2021-08-15: 10 mg via INTRAVENOUS
  Administered 2021-08-15: 50 mg via INTRAVENOUS
  Administered 2021-08-15: 20 mg via INTRAVENOUS
  Administered 2021-08-15: 30 mg via INTRAVENOUS

## 2021-08-15 MED ORDER — FENTANYL CITRATE (PF) 100 MCG/2ML IJ SOLN
INTRAMUSCULAR | Status: AC
  Administered 2021-08-15: 50 ug via INTRAVENOUS
  Filled 2021-08-15: qty 2

## 2021-08-15 MED ORDER — STERILE WATER FOR IRRIGATION IR SOLN
Status: DC | PRN
  Administered 2021-08-15: 1000 mL

## 2021-08-15 MED ORDER — LIDOCAINE HCL (CARDIAC) PF 100 MG/5ML IV SOSY
PREFILLED_SYRINGE | INTRAVENOUS | Status: DC | PRN
  Administered 2021-08-15: 100 mg via INTRAVENOUS

## 2021-08-15 MED ORDER — IBUPROFEN 600 MG PO TABS: 600.0000 mg | ORAL_TABLET | Freq: Four times a day (QID) | ORAL | 1 refills | Status: DC | PRN

## 2021-08-15 MED ORDER — PHENYLEPHRINE HCL (PRESSORS) 10 MG/ML IV SOLN
INTRAVENOUS | Status: DC | PRN
Start: 2021-08-15 — End: 2021-08-15
  Administered 2021-08-15 (×4): 100 ug via INTRAVENOUS

## 2021-08-15 MED ORDER — HEPARIN SODIUM (PORCINE) 5000 UNIT/ML IJ SOLN: 5000.0000 [IU] | INTRAMUSCULAR | Status: AC

## 2021-08-15 MED ORDER — CEFAZOLIN IN SODIUM CHLORIDE 3-0.9 GM/100ML-% IV SOLN
3.0000 g | INTRAVENOUS | Status: DC
  Filled 2021-08-15: qty 100

## 2021-08-15 MED ORDER — OXYCODONE HCL 5 MG PO TABS
10.0000 mg | ORAL_TABLET | Freq: Once | ORAL | Status: AC | PRN
  Administered 2021-08-15: 10 mg via ORAL

## 2021-08-15 MED ORDER — OXYCODONE HCL 5 MG PO TABS
ORAL_TABLET | ORAL | Status: AC
  Filled 2021-08-15: qty 2

## 2021-08-15 MED ORDER — PROPOFOL 10 MG/ML IV BOLUS
INTRAVENOUS | Status: DC | PRN
  Administered 2021-08-15: 170 mg via INTRAVENOUS

## 2021-08-15 MED ORDER — PROPOFOL 500 MG/50ML IV EMUL
INTRAVENOUS | Status: AC
  Filled 2021-08-15: qty 50

## 2021-08-15 MED ORDER — FENTANYL CITRATE (PF) 100 MCG/2ML IJ SOLN
25.0000 ug | INTRAMUSCULAR | Status: DC | PRN
  Administered 2021-08-15: 50 ug via INTRAVENOUS

## 2021-08-15 MED ORDER — CHLORHEXIDINE GLUCONATE 0.12 % MT SOLN
OROMUCOSAL | Status: AC
  Administered 2021-08-15: 15 mL via OROMUCOSAL
  Filled 2021-08-15: qty 15

## 2021-08-15 MED ORDER — INDOCYANINE GREEN 25 MG IV SOLR
INTRAVENOUS | Status: DC | PRN
  Administered 2021-08-15: 15 mg

## 2021-08-15 MED ORDER — 0.9 % SODIUM CHLORIDE (POUR BTL) OPTIME
TOPICAL | Status: DC | PRN
  Administered 2021-08-15: 100 mL

## 2021-08-15 MED ORDER — ONDANSETRON HCL 4 MG/2ML IJ SOLN
INTRAMUSCULAR | Status: DC | PRN
  Administered 2021-08-15 (×2): 4 mg via INTRAVENOUS

## 2021-08-15 MED ORDER — GLYCOPYRROLATE 0.2 MG/ML IJ SOLN
INTRAMUSCULAR | Status: DC | PRN
Start: 2021-08-15 — End: 2021-08-15
  Administered 2021-08-15: .2 mg via INTRAVENOUS

## 2021-08-15 MED ORDER — SODIUM CHLORIDE 0.9 % IV SOLN
INTRAVENOUS | Status: DC
Start: 2021-08-15 — End: 2021-08-15

## 2021-08-15 MED ORDER — STERILE WATER FOR IRRIGATION IR SOLN
Status: DC | PRN
  Administered 2021-08-15: 300 mL

## 2021-08-15 MED ORDER — DEXTROSE 5 % IV SOLN
INTRAVENOUS | Status: DC | PRN
  Administered 2021-08-15 (×2): 3 g via INTRAVENOUS

## 2021-08-15 MED ORDER — MIDAZOLAM HCL 2 MG/2ML IJ SOLN
INTRAMUSCULAR | Status: DC | PRN
  Administered 2021-08-15: 2 mg via INTRAVENOUS

## 2021-08-15 MED ORDER — METHYLENE BLUE 0.5 % INJ SOLN
INTRAVENOUS | Status: DC | PRN
  Administered 2021-08-15: 3 mL via INTRAVENOUS
  Administered 2021-08-15: 2 mL via INTRAVENOUS

## 2021-08-15 MED ORDER — MIDAZOLAM HCL 2 MG/2ML IJ SOLN
INTRAMUSCULAR | Status: AC
  Filled 2021-08-15: qty 2

## 2021-08-15 SURGICAL SUPPLY — 88 items
ANCHOR TIS RET SYS 1550ML (BAG) ×1 IMPLANT
ANCHOR TIS RET SYS 235ML (MISCELLANEOUS) ×1 IMPLANT
APPLICATOR COTTON TIP 6 STRL (MISCELLANEOUS) IMPLANT
APPLICATOR COTTON TIP 6IN STRL (MISCELLANEOUS) ×3
BAG LAPAROSCOPIC 12 15 PORT 16 (BASKET) IMPLANT
BAG RETRIEVAL 12/15 (BASKET) ×3
BAG URINE DRAIN 2000ML AR STRL (UROLOGICAL SUPPLIES) ×3 IMPLANT
BLADE SURG SZ11 CARB STEEL (BLADE) ×3 IMPLANT
CANNULA CAP OBTURATR AIRSEAL 8 (CAP) ×3 IMPLANT
CATH FOLEY 2WAY  5CC 16FR (CATHETERS) ×1
CATH URTH 16FR FL 2W BLN LF (CATHETERS) ×2 IMPLANT
CHLORAPREP W/TINT 26 (MISCELLANEOUS) ×4 IMPLANT
COVER TIP SHEARS 8 DVNC (MISCELLANEOUS) ×2 IMPLANT
COVER TIP SHEARS 8MM DA VINCI (MISCELLANEOUS) ×9
COVER WAND RF STERILE (DRAPES) ×3 IMPLANT
DERMABOND ADVANCED (GAUZE/BANDAGES/DRESSINGS) ×1
DERMABOND ADVANCED .7 DNX12 (GAUZE/BANDAGES/DRESSINGS) ×2 IMPLANT
DRAPE ARM DVNC X/XI (DISPOSABLE) ×8 IMPLANT
DRAPE COLUMN DVNC XI (DISPOSABLE) ×2 IMPLANT
DRAPE DA VINCI XI ARM (DISPOSABLE) ×8
DRAPE DA VINCI XI COLUMN (DISPOSABLE) ×3
DRAPE POUCH INSTRU U-SHP 10X18 (DRAPES) ×1 IMPLANT
DRESSING SURGICEL FIBRLLR 1X2 (HEMOSTASIS) IMPLANT
DRSG OPSITE POSTOP 3X4 (GAUZE/BANDAGES/DRESSINGS) ×1 IMPLANT
DRSG SURGICEL FIBRILLAR 1X2 (HEMOSTASIS) ×6
DRSG TELFA 3X8 NADH (GAUZE/BANDAGES/DRESSINGS) ×3 IMPLANT
ELECT CAUTERY BLADE 6.4 (BLADE) ×1 IMPLANT
ELECT REM PT RETURN 9FT ADLT (ELECTROSURGICAL) ×3
ELECTRODE REM PT RTRN 9FT ADLT (ELECTROSURGICAL) ×2 IMPLANT
GAUZE 4X4 16PLY ~~LOC~~+RFID DBL (SPONGE) ×6 IMPLANT
GLOVE SURG ENC MOIS LTX SZ6.5 (GLOVE) ×20 IMPLANT
GLOVE SURG UNDER POLY LF SZ6.5 (GLOVE) ×16 IMPLANT
GOWN STRL REUS W/ TWL LRG LVL3 (GOWN DISPOSABLE) ×16 IMPLANT
GOWN STRL REUS W/TWL LRG LVL3 (GOWN DISPOSABLE) ×16
GRASPER SUT TROCAR 14GX15 (MISCELLANEOUS) ×1 IMPLANT
IRRIGATION STRYKERFLOW (MISCELLANEOUS) IMPLANT
IRRIGATOR STRYKERFLOW (MISCELLANEOUS) ×3
IV NS 1000ML (IV SOLUTION) ×3
IV NS 1000ML BAXH (IV SOLUTION) IMPLANT
KIT IMAGING PINPOINTPAQ (MISCELLANEOUS) ×1 IMPLANT
KIT PINK PAD W/HEAD ARE REST (MISCELLANEOUS) ×3
KIT PINK PAD W/HEAD ARM REST (MISCELLANEOUS) ×2 IMPLANT
LABEL OR SOLS (LABEL) ×3 IMPLANT
MANIFOLD NEPTUNE II (INSTRUMENTS) ×3 IMPLANT
MANIPULATOR VCARE STD CRV RETR (MISCELLANEOUS) ×1 IMPLANT
NDL INSUFF ACCESS 14 VERSASTEP (NEEDLE) ×3 IMPLANT
NDL SPNL 22GX3.5 QUINCKE BK (NEEDLE) IMPLANT
NEEDLE HYPO 22GX1.5 SAFETY (NEEDLE) ×3 IMPLANT
NEEDLE SPNL 22GX3.5 QUINCKE BK (NEEDLE) ×3 IMPLANT
NEEDLE VERESS 14GA 120MM (NEEDLE) ×3 IMPLANT
NS IRRIG 1000ML POUR BTL (IV SOLUTION) ×3 IMPLANT
OBTURATOR OPTICAL STANDARD 8MM (TROCAR) ×3
OBTURATOR OPTICAL STND 8 DVNC (TROCAR) ×2
OBTURATOR OPTICALSTD 8 DVNC (TROCAR) IMPLANT
OCCLUDER COLPOPNEUMO (BALLOONS) ×3 IMPLANT
PACK GYN LAPAROSCOPIC (MISCELLANEOUS) ×3 IMPLANT
PAD DRESSING TELFA 3X8 NADH (GAUZE/BANDAGES/DRESSINGS) IMPLANT
PAD OB MATERNITY 4.3X12.25 (PERSONAL CARE ITEMS) ×3 IMPLANT
PAD PREP 24X41 OB/GYN DISP (PERSONAL CARE ITEMS) ×3 IMPLANT
PENCIL ELECTRO HAND CTR (MISCELLANEOUS) ×1 IMPLANT
SEAL CANN UNIV 5-8 DVNC XI (MISCELLANEOUS) ×6 IMPLANT
SEAL XI 5MM-8MM UNIVERSAL (MISCELLANEOUS) ×4
SEALER VESSEL DA VINCI XI (MISCELLANEOUS) ×3
SEALER VESSEL EXT DVNC XI (MISCELLANEOUS) IMPLANT
SET CYSTO W/LG BORE CLAMP LF (SET/KITS/TRAYS/PACK) ×1 IMPLANT
SET TUBE FILTERED XL AIRSEAL (SET/KITS/TRAYS/PACK) ×3 IMPLANT
SLEEVE ENDOPATH XCEL 5M (ENDOMECHANICALS) ×1 IMPLANT
SOLUTION ELECTROLUBE (MISCELLANEOUS) ×3 IMPLANT
SPONGE T-LAP 18X18 ~~LOC~~+RFID (SPONGE) ×2 IMPLANT
STAPLER SKIN PROX 35W (STAPLE) ×1 IMPLANT
STRIP CLOSURE SKIN 1/2X4 (GAUZE/BANDAGES/DRESSINGS) ×1 IMPLANT
SUT DVC VLOC 180 0 12IN GS21 (SUTURE) ×6
SUT MNCRL 4-0 (SUTURE) ×3
SUT MNCRL 4-0 27XMFL (SUTURE) ×2
SUT VIC AB 0 CT1 36 (SUTURE) ×4 IMPLANT
SUT VIC AB 2-0 CT1 (SUTURE) ×4 IMPLANT
SUT VIC AB 4-0 SH 27 (SUTURE) ×21
SUT VIC AB 4-0 SH 27XANBCTRL (SUTURE) IMPLANT
SUT VICRYL 0 UR6 27IN ABS (SUTURE) ×4 IMPLANT
SUTURE DVC VLC 180 0 12IN GS21 (SUTURE) IMPLANT
SUTURE MNCRL 4-0 27XMF (SUTURE) ×2 IMPLANT
SYR 10ML LL (SYRINGE) ×3 IMPLANT
SYR 50ML LL SCALE MARK (SYRINGE) ×3 IMPLANT
TAPE TRANSPORE STRL 2 31045 (GAUZE/BANDAGES/DRESSINGS) ×3 IMPLANT
TRAP SPECIMEN MUCUS 40CC (MISCELLANEOUS) ×1 IMPLANT
TROCAR VERSASTEP PLUS 12MM (TROCAR) ×3 IMPLANT
WATER STERILE IRR 3000ML UROMA (IV SOLUTION) ×1 IMPLANT
WATER STERILE IRR 500ML POUR (IV SOLUTION) ×3 IMPLANT

## 2021-08-15 NOTE — Anesthesia Preprocedure Evaluation (Signed)
Anesthesia Evaluation  Patient identified by MRN, date of birth, ID band Patient awake    Reviewed: Allergy & Precautions, H&P , NPO status , Patient's Chart, lab work & pertinent test results, reviewed documented beta blocker date and time   History of Anesthesia Complications Negative for: history of anesthetic complications  Airway Mallampati: III  TM Distance: >3 FB Neck ROM: full    Dental  (+) Dental Advidsory Given, Teeth Intact, Loose   Pulmonary neg pulmonary ROS,    Pulmonary exam normal breath sounds clear to auscultation       Cardiovascular Exercise Tolerance: Good hypertension, (-) angina(-) Past MI and (-) Cardiac Stents Normal cardiovascular exam(-) dysrhythmias (-) Valvular Problems/Murmurs Rhythm:regular Rate:Normal     Neuro/Psych negative neurological ROS  negative psych ROS   GI/Hepatic negative GI ROS, Neg liver ROS,   Endo/Other  diabetesHypothyroidism Morbid obesity  Renal/GU negative Renal ROS  negative genitourinary   Musculoskeletal   Abdominal   Peds  Hematology negative hematology ROS (+)   Anesthesia Other Findings Past Medical History: No date: Abnormal mammogram No date: Adult hypothyroidism No date: Anemia     Comment:  h/o No date: Diabetes mellitus without complication ([facility]) No date: Dyslipidemia No date: Endometrial cancer ([facility]) No date: History of shingles No date: History of thyroid irradiation No date: Hyperlipidemia No date: Hypertelorism No date: Hypertension No date: Microalbuminuria No date: Morbid obesity with BMI of 40.0-44.9, adult ([facility]) No date: Osteoarthritis of lower leg, localized No date: Pain in finger of right hand     Comment:  atypical hand synovitis (Dr. Jefm Bryant) No date: Psoriasis No date: Psoriatic arthritis ([place]) No date: Pterygium No date: Vitamin D deficiency   Reproductive/Obstetrics negative OB ROS                              Anesthesia Physical Anesthesia Plan  ASA: 3  Anesthesia Plan: General   Post-op Pain Management:    Induction: Intravenous  PONV Risk Score and Plan: 3 and Ondansetron, Dexamethasone and Treatment may vary due to age or medical condition  Airway Management Planned: Oral ETT  Additional Equipment:   Intra-op Plan:   Post-operative Plan: Extubation in OR  Informed Consent: I have reviewed the patients History and Physical, chart, labs and discussed the procedure including the risks, benefits and alternatives for the proposed anesthesia with the patient or authorized representative who has indicated his/her understanding and acceptance.     Dental Advisory Given  Plan Discussed with: Anesthesiologist, CRNA and Surgeon  Anesthesia Plan Comments:         Anesthesia Quick Evaluation

## 2021-08-15 NOTE — Transfer of Care (Signed)
Immediate Anesthesia Transfer of Care Note  Patient: Taylor Perry  Procedure(s) Performed: XI ROBOTIC ASSISTED TOTAL HYSTERECTOMY WITH BILATERAL SALPINGO OOPHORECTOMY, PELVIC SENTINEL LYMPH NODE INJECTION, MAPPING AND PELVIC LYMPH NODE SAMPLING,  PELVIC NODE DISSECTION, MINI LAPAROTOMY (Bilateral) CYSTOSCOPY  Patient Location: PACU  Anesthesia Type:General  Level of Consciousness: drowsy  Airway & Oxygen Therapy: Patient Spontanous Breathing and Patient connected to face mask oxygen  Post-op Assessment: Report given to RN  Post vital signs: stable  Last Vitals:  Vitals Value Taken Time  BP 123/69 08/15/21 1400  Temp 36.7 C 08/15/21 1400  Pulse 91 08/15/21 1402  Resp 14 08/15/21 1402  SpO2 100 % 08/15/21 1402  Vitals shown include unvalidated device data.  Last Pain:  Vitals:   08/15/21 0628  TempSrc: Temporal  PainSc: 0-No pain         Complications: No notable events documented.

## 2021-08-15 NOTE — Discharge Instructions (Signed)

## 2021-08-15 NOTE — Interval H&P Note (Signed)
History and Physical Interval Note:  08/15/2021 7:23 AM  Taylor Perry  has presented today for surgery, with the diagnosis of Uterine cancer.  The various methods of treatment have been discussed with the patient and family. After consideration of risks, benefits and other options for treatment, the patient has consented to  Procedure(s): XI ROBOTIC ASSISTED TOTAL HYSTERECTOMY WITH BILATERAL SALPINGO OOPHORECTOMY, PELVIC SENTINEL LYMPH NODE MAPPING AND BIOPSIES, POSSIBLE PELVIC/AORTIC NODE DISSECTION, POSSIBLE LAPAROTOMY (Bilateral) as a surgical intervention.  The patient's history has been reviewed, patient examined, no change in status, stable for surgery.  I have reviewed the patient's chart and labs.  Questions were answered to the patient's satisfaction.     Taylor Perry

## 2021-08-15 NOTE — Anesthesia Procedure Notes (Signed)
Procedure Name: Intubation Date/Time: 08/15/2021 7:44 AM Performed by: Lerry Liner, CRNA Pre-anesthesia Checklist: Patient identified, Emergency Drugs available, Suction available and Patient being monitored Patient Re-evaluated:Patient Re-evaluated prior to induction Oxygen Delivery Method: Circle system utilized Preoxygenation: Pre-oxygenation with 100% oxygen Induction Type: IV induction Ventilation: Mask ventilation without difficulty Laryngoscope Size: McGraph and 3 Grade View: Grade I Tube type: Oral Tube size: 7.5 mm Number of attempts: 1 Airway Equipment and Method: Stylet and Oral airway Placement Confirmation: ETT inserted through vocal cords under direct vision, positive ETCO2 and breath sounds checked- equal and bilateral Secured at: 22 cm Tube secured with: Tape Dental Injury: Teeth and Oropharynx as per pre-operative assessment

## 2021-08-15 NOTE — Op Note (Signed)
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 Body mass index is 42.59 kg/m.    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 Pelvic adhesive disease.    SURGEON: Surgeon(s) and Role: Panel 1:    Anders Hohmann Gaetana Michaelis, MD  ASSISTANT:  Rubie Maid, MD  ANESTHESIA: Choice   PROCEDURE: Procedure(s): Exam under anethesia, RA-TLH, BSO, Sentinel node injection, mapping, and bilateral pelvic sentinel lymph node sampling, washings, minilaparotomy, and cystoscopy.  ESTIMATED BLOOD LOSS: 100 cc  DRAINS: Foley UOP: 350 cc  TOTAL IV FLUIDS: 1600cc  SPECIMENS:  Washings; Uterus, B FTs and ovaries; Right primary sentinel external iliac vein node; left primary sentinel external iliac vein node   COMPLICATIONS: None  DISPOSITION: Stable to PACU  CONDITION: Stable  INDICATIONS: Endometrial cancer  FINDINGS: Exam under anesthesia revealed a mobile anteverted uterus, unable to determine size due to habitus. There were no adnexal masses or nodularity. The parametria was smooth. The cervix was negative for gross lesions but hard to palpation. Intraoperative findings included the following: The uterus was enlarged to at least 15 cm. The adnexa were normal bilaterally. The upper abdomen was normal including omentum, bowel, liver, stomach, and diaphragmatic surfaces. The omental was adherent to the right pelvic side wall, round ligament, and right adnexa.  There was no evidence of grossly malignant pelvic lymph nodes. Upon vaginal cuff closure there was a tumor appearing 1 cm lesions at the left vaginal cuff. The sentinel node injection was successful with evidence of right and left primary sentinel external iliac vein nodes.   PROCEDURE IN DETAIL: After informed consent was obtained,  the patient was taken to the operating room where anesthesia was obtained without difficulty. The patient was positioned in the dorsal lithotomy position in Hodgkins and her arms were carefully tucked at her sides and the usual precautions were taken.  She was prepped and draped in normal sterile fashion.  Time-out was performed and a Foley catheter was placed into the bladder and the cervix was infiltrated with 4 ml of ICG at 3 an 9 o'clock both superficial and deep injections. A standard VCare uterine manipulator was then placed in the uterus without incident.  However, due to the size the of uterus and adhesions of the omentum to the right pelvis the uterus was not able to be manipulated. A perforation was noted in the left posterior aspect of the uterus. No leakage of tumor was noted. Marland Kitchen    Operative entry was obtained via an umbilical incision and direct entry. The abdomen insuffulated, and pelvis visualized with noted findings above. Washings obtained. The robotic ports and LUQ port placement, and the patient was placed in Trendelenburg. Given the size of the uterus an additional supraumbilical port was placed. Due to adhesive disease the bowel could not be placed in the upper abdomen. The robotic docking was performed and operative instruments inserted.   The adhesions involving the omentum were lysed and the bowel was displaced up into the upper abdomen. The Mega Suture Cut was inserted and the uterine perforation closed with V-lock suture. Round ligaments were divided on each side with the monopolar scissors and the retroperitoneal space was opened bilaterally. The bladder flaps was created and the uterus was easier to manipulate.  The ureters were identified and preserved.  At this point the retroperitoneal spaces were developed and  the lymphatic channels were mapped to each side.  The primary sentinel node on the right side was then identified, skeletonized and removed taking care not to injure  the  obturator nerve, the ureter or the pelvic vasculature. Similarly on the left side, the retroperitoneal spaces were developed, the lymphatic channels mapped to identify the sentinel node. The infundibulopelvic ligaments were skeletonized, sealed and divided.  A bladder flap was further created and the bladder was dissected down off the lower uterine segment and cervix using endoshears and electrocautery.  The uterine arteries were skeletonized bilaterally, sealed and divided with the Vessel sealer and bipolar.  Bleeding was encountered on the right paracervical tissue and controlled with pressure with a Raytec and fibrillar. A colpotomy was performed circumferentially along the V-Care ring with electrocautery and the cervix was incised from the vagina. The specimen was placed in a large laparoscopic bag but due to the size could not removed through the vagina. The bag could not even be delivered to the introitus. The specimen was pushed back into the pelvis and secured.  A pneumo balloon was placed in the vagina.   Attention was turned to the previously identified nodes and they were removed with care to preserve the obturator nerve, the ureter and the pelvic vasculature. Hemostasis was confirmed. The nodes were removed from the vagina. The Raytec was removed vaginally. The site was bleeding was identified and cauterized. The ureter was felt to be lateral and posterior to this area.    The vaginal cuff was then closed in a running continuous fashion using the running technique with 0 V-Lock suture with careful attention to include the vaginal cuff angles and the vaginal mucosa within the closure. At the left aspect of the vagina a tumor nodule was identified and removed in a laparoscopic bag via the LUQ port.   Hemostasis was observed. The intraperitoneal pressure was dropped, and all planes of dissection, vascular pedicles and the vaginal cuff were found to be hemostatic.  The patient was given methylene  blue and there was no evidence of intraperitoneal blue dye.   The LUQ trocar was removed and the fascia was closed with 0 Vicryl suture using the Endoclose technique. The bag with the specimen was secured. The umbilical ports were removed a vertical incision made from one port site to the other and carried down the various layers until the peritoneal cavity was entered. The specimen was removed intact in the bag. The fascia was closed in a running fashion from one end to the other using 0-Vicryl.   The lateral trocars were removed under visualization and the last port was used to remove the CO2 gas was released. The vertical skin incision (minilaparotomy) at the umbilicus was closed. First the umbilicus was tacked down to the fascia with subcuticular stitch. The incision was closed in layers and skin closed with 4-0 Vicryl and reinforced with  staples. A sterile dressing was placed. The remaining skin incisions were closed with interrupted 4-0 Vicryl and glue.    Cystoscopy was performed by Dr. Marcelline Mates and noted to have spillage from bilateral ureters and no concerning findings.    The patient tolerated the procedure well.  Sponge, lap and needle counts were correct x2.  The patient was taken to recovery room in excellent condition.  Antibiotics: Given 1st or 2nd generation cephalosporin, Antibiotics given within 1 hour of the start of the procedure, Antibiotics ordered to be discontinued within 24 hours post procedure. She did get one additional dose of  antibiotics in the procedure.    VTE prophylaxis: was ordered perioperatively.  Dr. Marcelline Mates assisted me with the procedure which could not have been performed without her assistance. This was a high-level case requiring a Insurance risk surveyor. Dr. Marcelline Mates performed the initial abdominal entry, conversion of initial umbilical port to robotic port, docking, and insertion of robotic instruments while I performed the vaginal part of the procedure and SLN  injection. She also provided high level assistance throughout the procedure that was needed for resection of leiomyoma and difficult uterine manipulation. She performed the cystoscopy.    Gillis Ends, MD

## 2021-08-16 ENCOUNTER — Encounter: Payer: Self-pay | Admitting: Obstetrics and Gynecology

## 2021-08-16 NOTE — Anesthesia Postprocedure Evaluation (Signed)
Anesthesia Post Note  Patient: Taylor Perry  Procedure(s) Performed: XI ROBOTIC ASSISTED TOTAL HYSTERECTOMY WITH BILATERAL SALPINGO OOPHORECTOMY, PELVIC SENTINEL LYMPH NODE INJECTION, MAPPING AND PELVIC LYMPH NODE SAMPLING,  PELVIC NODE DISSECTION, MINI LAPAROTOMY (Bilateral) CYSTOSCOPY  Patient location during evaluation: PACU Anesthesia Type: General Level of consciousness: awake and alert Pain management: pain level controlled Vital Signs Assessment: post-procedure vital signs reviewed and stable Respiratory status: spontaneous breathing, nonlabored ventilation, respiratory function stable and patient connected to nasal cannula oxygen Cardiovascular status: blood pressure returned to baseline and stable Postop Assessment: no apparent nausea or vomiting Anesthetic complications: no   No notable events documented.   Last Vitals:  Vitals:   08/15/21 1530 08/15/21 1549  BP: 124/67 127/70  Pulse: 82 86  Resp: 13 16  Temp:  36.9 C  SpO2: 98% 99%    Last Pain:  Vitals:   08/15/21 1549  TempSrc: Temporal  PainSc: 3                  Martha Clan

## 2021-08-17 ENCOUNTER — Telehealth: Payer: Self-pay | Admitting: Obstetrics and Gynecology

## 2021-08-17 LAB — CYTOLOGY - NON PAP

## 2021-08-17 NOTE — Telephone Encounter (Signed)
°  ° °  [facility] ([place]) [address] [place], [place]  [address] Phone:  [phone]   Fax:  [phone]    Nursing Post-Op Call       How well has your pain been managed at home?   1- worst 2 3 (mostly when ambulating) 4 5- Best    2. Do you have any concerns about your surgical site or dressing?  No concerns.      3. Can you tell me when your follow-up appointment is?   March 1st at [facility].      4. Do you have any concerns about your home care?  Notes she has not yet had a bowel movement.  Advised on taking stool softener or decreasing use of the narcotic pain medication if possible.     5. Are you taking all of your discharge medications?  Yes, taking medications as prescribed.

## 2021-08-22 ENCOUNTER — Inpatient Hospital Stay: Payer: Medicare Other | Attending: Obstetrics and Gynecology | Admitting: Obstetrics and Gynecology

## 2021-08-22 ENCOUNTER — Encounter: Payer: Self-pay | Admitting: Obstetrics and Gynecology

## 2021-08-22 ENCOUNTER — Other Ambulatory Visit: Payer: Self-pay

## 2021-08-22 ENCOUNTER — Encounter: Payer: Self-pay | Admitting: Oncology

## 2021-08-22 ENCOUNTER — Inpatient Hospital Stay (HOSPITAL_BASED_OUTPATIENT_CLINIC_OR_DEPARTMENT_OTHER): Payer: Medicare Other | Admitting: Oncology

## 2021-08-22 VITALS — BP 137/73 | HR 85 | Temp 96.1°F | Resp 18 | Wt 272.0 lb

## 2021-08-22 DIAGNOSIS — Z5111 Encounter for antineoplastic chemotherapy: Secondary | ICD-10-CM | POA: Insufficient documentation

## 2021-08-22 DIAGNOSIS — E559 Vitamin D deficiency, unspecified: Secondary | ICD-10-CM | POA: Insufficient documentation

## 2021-08-22 DIAGNOSIS — E785 Hyperlipidemia, unspecified: Secondary | ICD-10-CM | POA: Diagnosis not present

## 2021-08-22 DIAGNOSIS — D638 Anemia in other chronic diseases classified elsewhere: Secondary | ICD-10-CM

## 2021-08-22 DIAGNOSIS — Z6841 Body Mass Index (BMI) 40.0 and over, adult: Secondary | ICD-10-CM

## 2021-08-22 DIAGNOSIS — L4050 Arthropathic psoriasis, unspecified: Secondary | ICD-10-CM | POA: Diagnosis not present

## 2021-08-22 DIAGNOSIS — I10 Essential (primary) hypertension: Secondary | ICD-10-CM | POA: Insufficient documentation

## 2021-08-22 DIAGNOSIS — Z90722 Acquired absence of ovaries, bilateral: Secondary | ICD-10-CM | POA: Diagnosis not present

## 2021-08-22 DIAGNOSIS — K8020 Calculus of gallbladder without cholecystitis without obstruction: Secondary | ICD-10-CM | POA: Insufficient documentation

## 2021-08-22 DIAGNOSIS — M069 Rheumatoid arthritis, unspecified: Secondary | ICD-10-CM | POA: Diagnosis not present

## 2021-08-22 DIAGNOSIS — Z808 Family history of malignant neoplasm of other organs or systems: Secondary | ICD-10-CM | POA: Diagnosis not present

## 2021-08-22 DIAGNOSIS — N281 Cyst of kidney, acquired: Secondary | ICD-10-CM | POA: Insufficient documentation

## 2021-08-22 DIAGNOSIS — E039 Hypothyroidism, unspecified: Secondary | ICD-10-CM | POA: Diagnosis not present

## 2021-08-22 DIAGNOSIS — Z79899 Other long term (current) drug therapy: Secondary | ICD-10-CM | POA: Insufficient documentation

## 2021-08-22 DIAGNOSIS — Z7189 Other specified counseling: Secondary | ICD-10-CM

## 2021-08-22 DIAGNOSIS — C541 Malignant neoplasm of endometrium: Secondary | ICD-10-CM | POA: Insufficient documentation

## 2021-08-22 DIAGNOSIS — Z90710 Acquired absence of both cervix and uterus: Secondary | ICD-10-CM | POA: Insufficient documentation

## 2021-08-22 DIAGNOSIS — K449 Diaphragmatic hernia without obstruction or gangrene: Secondary | ICD-10-CM | POA: Diagnosis not present

## 2021-08-22 DIAGNOSIS — C55 Malignant neoplasm of uterus, part unspecified: Secondary | ICD-10-CM

## 2021-08-22 DIAGNOSIS — E119 Type 2 diabetes mellitus without complications: Secondary | ICD-10-CM | POA: Diagnosis not present

## 2021-08-22 DIAGNOSIS — M059 Rheumatoid arthritis with rheumatoid factor, unspecified: Secondary | ICD-10-CM
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
## 2021-08-22 NOTE — Progress Notes (Signed)
Patient reports no concerns today and says she feels that she is healing well. Her pain is a 5/10 and is mostly when ambulating. She did have a bowel movement Saturday and has had one every day since then. She would like to know when she can start back on her humira injections. I advised her that Dr Fransisca Connors would assess and let her know.  ?

## 2021-08-22 NOTE — Progress Notes (Signed)
START ON PATHWAY REGIMEN - Uterine ? ? ?  A cycle is every 21 days: ?    Paclitaxel  ?    Carboplatin  ? ?**Always confirm dose/schedule in your pharmacy ordering system** ? ?Patient Characteristics: ?Carcinosarcoma, Newly Diagnosed, Postoperative (Pathologic Staging), Postoperative ?Histology: Carcinosarcoma ?Therapeutic Status: Newly Diagnosed, Postoperative (Pathologic Staging) ?AJCC M Category: cM0 ?AJCC 8 Stage Grouping: IIIC1 ?AJCC T Category: pT3b ?AJCC N Category: pN1a ?Intent of Therapy: ?Curative Intent, Discussed with Patient ?
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
## 2021-08-22 NOTE — Progress Notes (Signed)
Patient on plan of care prior to pathways. 

## 2021-08-23 ENCOUNTER — Telehealth: Payer: Self-pay

## 2021-08-23 ENCOUNTER — Other Ambulatory Visit: Payer: Self-pay

## 2021-08-23 DIAGNOSIS — C541 Malignant neoplasm of endometrium: Secondary | ICD-10-CM

## 2021-08-23 NOTE — Telephone Encounter (Signed)
-----   Message from Earlie Server, MD sent at 08/22/2021  7:22 PM EST ----- ?Let her know that there is a spot in her liver showing low activity. Probably not cancer. I recommend her to get MRI liver w and wo contrast to further characterize. If she agrees , please arrange, ASAP. Thanks. [ No need to be stat].  ? ?

## 2021-08-23 NOTE — Telephone Encounter (Signed)
Spoke with patient and informed her of results. Advised per Dr. Tasia Catchings, that patient get a MRI of liver to further characterized. Patient is in agreement with plan.Informed patient that one of our schedulers will be int ouch to get that scheduled. Patient verbalized understanding.   ?

## 2021-08-24 ENCOUNTER — Encounter: Payer: Self-pay | Admitting: Family Medicine

## 2021-08-25 ENCOUNTER — Ambulatory Visit
Admission: RE | Admit: 2021-08-25 | Discharge: 2021-08-25 | Disposition: A | Discharge status: Home / Self Care | Payer: Medicare Other | Source: Ambulatory Visit | Attending: Oncology | Admitting: Oncology

## 2021-08-25 ENCOUNTER — Other Ambulatory Visit: Payer: Self-pay

## 2021-08-25 DIAGNOSIS — C541 Malignant neoplasm of endometrium: Secondary | ICD-10-CM | POA: Diagnosis present

## 2021-08-25 MED ORDER — GADOBUTROL 1 MMOL/ML IV SOLN
10.0000 mL | Freq: Once | INTRAVENOUS | Status: AC | PRN
  Administered 2021-08-25: 10 mL via INTRAVENOUS

## 2021-08-27 LAB — SURGICAL PATHOLOGY

## 2021-08-28 ENCOUNTER — Encounter: Payer: Self-pay | Admitting: Obstetrics and Gynecology

## 2021-08-29 ENCOUNTER — Other Ambulatory Visit: Payer: Medicare Other

## 2021-08-29 DIAGNOSIS — C55 Malignant neoplasm of uterus, part unspecified: Secondary | ICD-10-CM

## 2021-08-29 NOTE — Progress Notes (Signed)
Tumor Board Documentation ? ?Addalyn Speedy was presented by Mariea Clonts, RN at our Tumor Board on 08/29/2021, which included representatives from medical oncology, radiation oncology, surgical oncology, genetics, pathology, navigation, palliative care. ? ?Yaeli currently presents as a new patient with history of the following treatments: surgical intervention(s) (Pathology: Vaginal cuff margin is positive. ER negative. PR negative. MMR intact. HER2 negative 1+. P53 aberrant/over expression). ? ?Additionally, we reviewed previous medical and familial history, history of present illness, and recent lab results along with all available histopathologic and imaging studies. The tumor board considered available treatment options and made the following recommendations: ?Adjuvant chemotherapy, Adjuvant radiation ?Adjuvant chemotherapy including 6 cycles of carbo-taxol with CT after 3 cycles was recommended. Adjuvant radiation was also recommended including possible treatment of peri-aortic lymph nodes but will defer to Dr. Baruch Gouty. Recommend vaginal brachytherapy. ? ?The following procedures/referrals were also placed: No orders of the defined types were placed in this encounter. ? ?Clinical Trial Status: not discussed  ? ?Staging used:  (FIGO 3C1) ? ?National site-specific guidelines OTHER were discussed with respect to the case. ? ?Tumor board is a meeting of clinicians from various specialty areas who evaluate and discuss patients for whom a multidisciplinary approach is being considered. Final determinations in the plan of care are those of the provider(s). The responsibility for follow up of recommendations given during tumor board is that of the provider.  ? ?Today?s extended care, comprehensive team conference, Khala was not present for the discussion and was not examined.  ? ?

## 2021-08-31 ENCOUNTER — Telehealth: Payer: Self-pay | Admitting: Family Medicine

## 2021-08-31 NOTE — Telephone Encounter (Signed)
Spoke to patient, she said she will start chemo soon and has a visit with nurse educator next week. Her daughter is going with her to the class and she has good family support  ?

## 2021-09-01 ENCOUNTER — Other Ambulatory Visit: Payer: Self-pay | Admitting: Oncology

## 2021-09-01 DIAGNOSIS — C55 Malignant neoplasm of uterus, part unspecified: Secondary | ICD-10-CM

## 2021-09-01 MED ORDER — PROCHLORPERAZINE MALEATE 10 MG PO TABS: 10.0000 mg | ORAL_TABLET | Freq: Four times a day (QID) | ORAL | 1 refills | Status: DC | PRN

## 2021-09-01 MED ORDER — ONDANSETRON HCL 8 MG PO TABS: 8.0000 mg | ORAL_TABLET | Freq: Two times a day (BID) | ORAL | 1 refills | Status: DC | PRN

## 2021-09-01 MED ORDER — DEXAMETHASONE 4 MG PO TABS: ORAL_TABLET | ORAL | 1 refills | Status: DC

## 2021-09-03 ENCOUNTER — Inpatient Hospital Stay: Payer: Medicare Other

## 2021-09-03 ENCOUNTER — Other Ambulatory Visit: Payer: Self-pay

## 2021-09-03 NOTE — Progress Notes (Signed)
Pharmacist Chemotherapy Monitoring - Initial Assessment   ? ?Anticipated start date: 09/10/21  ? ?The following has been reviewed per standard work regarding the patient's treatment regimen: ?The patient's diagnosis, treatment plan and drug doses, and organ/hematologic function ?Lab orders and baseline tests specific to treatment regimen  ?The treatment plan start date, drug sequencing, and pre-medications ?Prior authorization status  ?Patient's documented medication list, including drug-drug interaction screen and prescriptions for anti-emetics and supportive care specific to the treatment regimen ?The drug concentrations, fluid compatibility, administration routes, and timing of the medications to be used ?The patient's access for treatment and lifetime cumulative dose history, if applicable  ?The patient's medication allergies and previous infusion related reactions, if applicable  ? ?Changes made to treatment plan:  ?N/A ? ?Follow up needed:  ?dose adjustment of carbo due to dosing and signing treatment plan ? ? ?Taylor Perry, [facility], ?09/03/2021  9:06 AM  ?

## 2021-09-10 ENCOUNTER — Encounter: Payer: Self-pay | Admitting: Oncology

## 2021-09-10 ENCOUNTER — Inpatient Hospital Stay: Payer: Medicare Other

## 2021-09-10 ENCOUNTER — Inpatient Hospital Stay (HOSPITAL_BASED_OUTPATIENT_CLINIC_OR_DEPARTMENT_OTHER): Payer: Medicare Other | Admitting: Oncology

## 2021-09-10 ENCOUNTER — Other Ambulatory Visit: Payer: Self-pay

## 2021-09-10 VITALS — BP 134/74 | HR 97 | Temp 96.7°F | Wt 277.0 lb

## 2021-09-10 VITALS — BP 127/77 | HR 63 | Temp 96.7°F | Resp 18

## 2021-09-10 DIAGNOSIS — Z5111 Encounter for antineoplastic chemotherapy: Secondary | ICD-10-CM | POA: Diagnosis not present

## 2021-09-10 DIAGNOSIS — Z7189 Other specified counseling: Secondary | ICD-10-CM

## 2021-09-10 DIAGNOSIS — C55 Malignant neoplasm of uterus, part unspecified: Secondary | ICD-10-CM

## 2021-09-10 DIAGNOSIS — D638 Anemia in other chronic diseases classified elsewhere: Secondary | ICD-10-CM

## 2021-09-10 DIAGNOSIS — Z6841 Body Mass Index (BMI) 40.0 and over, adult: Secondary | ICD-10-CM

## 2021-09-10 LAB — COMPREHENSIVE METABOLIC PANEL
ALT: 16 U/L (ref 0–44)
AST: 24 U/L (ref 15–41)
Albumin: 3.5 g/dL (ref 3.5–5.0)
Alkaline Phosphatase: 75 U/L (ref 38–126)
Anion gap: 6 (ref 5–15)
BUN: 15 mg/dL (ref 8–23)
CO2: 27 mmol/L (ref 22–32)
Calcium: 8.8 mg/dL — ABNORMAL LOW (ref 8.9–10.3)
Chloride: 107 mmol/L (ref 98–111)
Creatinine, Ser: 1.01 mg/dL — ABNORMAL HIGH (ref 0.44–1.00)
GFR, Estimated: >60 mL/min (ref >60)
Glucose, Bld: 144 mg/dL — ABNORMAL HIGH (ref 70–99)
Potassium: 3.7 mmol/L (ref 3.5–5.1)
Sodium: 140 mmol/L (ref 135–145)
Total Bilirubin: 0.3 mg/dL (ref 0.3–1.2)
Total Protein: 7.2 g/dL (ref 6.5–8.1)

## 2021-09-10 LAB — CBC WITH DIFFERENTIAL/PLATELET
Abs Immature Granulocytes: 0.01 10*3/uL (ref 0.00–0.07)
Basophils Absolute: 0 10*3/uL (ref 0.0–0.1)
Basophils Relative: 1 %
Eosinophils Absolute: 0.2 10*3/uL (ref 0.0–0.5)
Eosinophils Relative: 5 %
HCT: 31.8 % — ABNORMAL LOW (ref 36.0–46.0)
Hemoglobin: 10.2 g/dL — ABNORMAL LOW (ref 12.0–15.0)
Immature Granulocytes: 0 %
Lymphocytes Relative: 31 %
Lymphs Abs: 1.3 10*3/uL (ref 0.7–4.0)
MCH: 27.9 pg (ref 26.0–34.0)
MCHC: 32.1 g/dL (ref 30.0–36.0)
MCV: 86.9 fL (ref 80.0–100.0)
Monocytes Absolute: 0.6 10*3/uL (ref 0.1–1.0)
Monocytes Relative: 15 %
Neutro Abs: 2.1 10*3/uL (ref 1.7–7.7)
Neutrophils Relative %: 48 %
Platelets: 178 10*3/uL (ref 150–400)
RBC: 3.66 MIL/uL — ABNORMAL LOW (ref 3.87–5.11)
RDW: 14.2 % (ref 11.5–15.5)
WBC: 4.3 10*3/uL (ref 4.0–10.5)
nRBC: 0 % (ref 0.0–0.2)

## 2021-09-10 LAB — IRON AND TIBC
Iron: 45 ug/dL (ref 28–170)
Saturation Ratios: 15 % (ref 10.4–31.8)
TIBC: 304 ug/dL (ref 250–450)
UIBC: 259 ug/dL

## 2021-09-10 LAB — FERRITIN: Ferritin: 49 ng/mL (ref 11–307)

## 2021-09-10 MED ORDER — DIPHENHYDRAMINE HCL 50 MG/ML IJ SOLN
50.0000 mg | Freq: Once | INTRAMUSCULAR | Status: AC
  Administered 2021-09-10: 50 mg via INTRAVENOUS
  Filled 2021-09-10: qty 1

## 2021-09-10 MED ORDER — SODIUM CHLORIDE 0.9 % IV SOLN
Freq: Once | INTRAVENOUS | Status: AC
  Filled 2021-09-10: qty 250

## 2021-09-10 MED ORDER — SODIUM CHLORIDE 0.9 % IV SOLN
150.0000 mg | Freq: Once | INTRAVENOUS | Status: AC
  Administered 2021-09-10: 150 mg via INTRAVENOUS
  Filled 2021-09-10: qty 150

## 2021-09-10 MED ORDER — FAMOTIDINE IN NACL 20-0.9 MG/50ML-% IV SOLN
20.0000 mg | Freq: Once | INTRAVENOUS | Status: AC
  Administered 2021-09-10: 20 mg via INTRAVENOUS
  Filled 2021-09-10: qty 50

## 2021-09-10 MED ORDER — PALONOSETRON HCL INJECTION 0.25 MG/5ML
0.2500 mg | Freq: Once | INTRAVENOUS | Status: AC
  Administered 2021-09-10: 0.25 mg via INTRAVENOUS
  Filled 2021-09-10: qty 5

## 2021-09-10 MED ORDER — SODIUM CHLORIDE 0.9 % IV SOLN
135.0000 mg/m2 | Freq: Once | INTRAVENOUS | Status: AC
  Administered 2021-09-10: 324 mg via INTRAVENOUS
  Filled 2021-09-10: qty 54

## 2021-09-10 MED ORDER — SODIUM CHLORIDE 0.9 % IV SOLN
10.0000 mg | Freq: Once | INTRAVENOUS | Status: AC
  Administered 2021-09-10: 10 mg via INTRAVENOUS
  Filled 2021-09-10: qty 10

## 2021-09-10 MED ORDER — SODIUM CHLORIDE 0.9 % IV SOLN
773.4000 mg | Freq: Once | INTRAVENOUS | Status: AC
  Administered 2021-09-10: 770 mg via INTRAVENOUS
  Filled 2021-09-10: qty 77
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
## 2021-09-10 NOTE — Patient Instructions (Signed)
[facility]  Discharge Instructions: °Thank you for choosing [facility] to provide your oncology and hematology care.  °If you have a lab appointment with the Cancer Center, please go directly to the Cancer Center and check in at the registration area. ° °Wear comfortable clothing and clothing appropriate for easy access to any Portacath or PICC line.  ° °We strive to give you quality time with your provider. You may need to reschedule your appointment if you arrive late (15 or more minutes).  Arriving late affects you and other patients whose appointments are after yours.  Also, if you miss three or more appointments without notifying the office, you may be dismissed from the clinic at the provider’s discretion.    °  °For prescription refill requests, have your pharmacy contact our office and allow 72 hours for refills to be completed.   ° °Today you received the following chemotherapy and/or immunotherapy agents : Taxol / Carboplatin   °  °To help prevent nausea and vomiting after your treatment, we encourage you to take your nausea medication as directed. ° °BELOW ARE SYMPTOMS THAT SHOULD BE REPORTED IMMEDIATELY: °*FEVER GREATER THAN 100.4 F (38 °C) OR HIGHER °*CHILLS OR SWEATING °*NAUSEA AND VOMITING THAT IS NOT CONTROLLED WITH YOUR NAUSEA MEDICATION °*UNUSUAL SHORTNESS OF BREATH °*UNUSUAL BRUISING OR BLEEDING °*URINARY PROBLEMS (pain or burning when urinating, or frequent urination) °*BOWEL PROBLEMS (unusual diarrhea, constipation, pain near the anus) °TENDERNESS IN MOUTH AND THROAT WITH OR WITHOUT PRESENCE OF ULCERS (sore throat, sores in mouth, or a toothache) °UNUSUAL RASH, SWELLING OR PAIN  °UNUSUAL VAGINAL DISCHARGE OR ITCHING  ° °Items with * indicate a potential emergency and should be followed up as soon as possible or go to the Emergency Department if any problems should occur. ° °Please show the CHEMOTHERAPY ALERT CARD or IMMUNOTHERAPY ALERT CARD at  check-in to the Emergency Department and triage nurse. ° °Should you have questions after your visit or need to cancel or reschedule your appointment, please contact [facility]  [phone] and follow the prompts.  Office hours are 8:00 a.m. to 4:30 p.m. Monday - Friday. Please note that voicemails left after 4:00 p.m. may not be returned until the following business day.  We are closed weekends and major holidays. You have access to a nurse at all times for urgent questions. Please call the main number to the clinic [phone] and follow the prompts. ° °For any non-urgent questions, you may also contact your provider using MyChart. We now offer e-Visits for anyone 18 and older to request care online for non-urgent symptoms. For details visit [email or web address]. °  °Also download the MyChart app! Go to the app store, search "MyChart", open the app, select [facility], and log in with your MyChart username and password. ° °Due to Covid, a mask is required upon entering the hospital/clinic. If you do not have a mask, one will be given to you upon arrival. For doctor visits, patients may have 1 support person aged 18 or older with them. For treatment visits, patients cannot have anyone with them due to current Covid guidelines and our immunocompromised population.  °

## 2021-09-14 LAB — COLOGUARD: COLOGUARD: NEGATIVE

## 2021-09-17 ENCOUNTER — Other Ambulatory Visit: Payer: Self-pay

## 2021-09-17 ENCOUNTER — Inpatient Hospital Stay: Payer: Medicare Other

## 2021-09-17 ENCOUNTER — Encounter: Payer: Self-pay | Admitting: Nurse Practitioner

## 2021-09-17 ENCOUNTER — Ambulatory Visit: Payer: Medicare Other

## 2021-09-17 ENCOUNTER — Inpatient Hospital Stay (HOSPITAL_BASED_OUTPATIENT_CLINIC_OR_DEPARTMENT_OTHER): Payer: Medicare Other | Admitting: Nurse Practitioner

## 2021-09-17 VITALS — BP 114/64 | HR 94 | Temp 95.7°F | Ht 67.5 in | Wt 265.7 lb

## 2021-09-17 DIAGNOSIS — Z5111 Encounter for antineoplastic chemotherapy: Secondary | ICD-10-CM | POA: Diagnosis not present

## 2021-09-17 DIAGNOSIS — C55 Malignant neoplasm of uterus, part unspecified: Secondary | ICD-10-CM | POA: Diagnosis not present

## 2021-09-17 LAB — CBC WITH DIFFERENTIAL/PLATELET
Abs Immature Granulocytes: 0.02 10*3/uL (ref 0.00–0.07)
Basophils Absolute: 0 10*3/uL (ref 0.0–0.1)
Basophils Relative: 0 %
Eosinophils Absolute: 0.2 10*3/uL (ref 0.0–0.5)
Eosinophils Relative: 7 %
HCT: 34.5 % — ABNORMAL LOW (ref 36.0–46.0)
Hemoglobin: 11.2 g/dL — ABNORMAL LOW (ref 12.0–15.0)
Immature Granulocytes: 1 %
Lymphocytes Relative: 35 %
Lymphs Abs: 1 10*3/uL (ref 0.7–4.0)
MCH: 27.7 pg (ref 26.0–34.0)
MCHC: 32.5 g/dL (ref 30.0–36.0)
MCV: 85.4 fL (ref 80.0–100.0)
Monocytes Absolute: 0.1 10*3/uL (ref 0.1–1.0)
Monocytes Relative: 2 %
Neutro Abs: 1.6 10*3/uL — ABNORMAL LOW (ref 1.7–7.7)
Neutrophils Relative %: 55 %
Platelets: 145 10*3/uL — ABNORMAL LOW (ref 150–400)
RBC: 4.04 MIL/uL (ref 3.87–5.11)
RDW: 13.7 % (ref 11.5–15.5)
WBC: 2.9 10*3/uL — ABNORMAL LOW (ref 4.0–10.5)
nRBC: 0 % (ref 0.0–0.2)

## 2021-09-17 LAB — COMPREHENSIVE METABOLIC PANEL
ALT: 34 U/L (ref 0–44)
AST: 39 U/L (ref 15–41)
Albumin: 3.7 g/dL (ref 3.5–5.0)
Alkaline Phosphatase: 73 U/L (ref 38–126)
Anion gap: 7 (ref 5–15)
BUN: 27 mg/dL — ABNORMAL HIGH (ref 8–23)
CO2: 24 mmol/L (ref 22–32)
Calcium: 8.6 mg/dL — ABNORMAL LOW (ref 8.9–10.3)
Chloride: 100 mmol/L (ref 98–111)
Creatinine, Ser: 1.11 mg/dL — ABNORMAL HIGH (ref 0.44–1.00)
GFR, Estimated: 54 mL/min — ABNORMAL LOW (ref >60)
Glucose, Bld: 108 mg/dL — ABNORMAL HIGH (ref 70–99)
Potassium: 4.5 mmol/L (ref 3.5–5.1)
Sodium: 131 mmol/L — ABNORMAL LOW (ref 135–145)
Total Bilirubin: 0.5 mg/dL (ref 0.3–1.2)
Total Protein: 7.7 g/dL (ref 6.5–8.1)
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
## 2021-09-24 ENCOUNTER — Ambulatory Visit: Payer: Medicare Other

## 2021-09-24 ENCOUNTER — Inpatient Hospital Stay: Payer: Medicare Other | Attending: Obstetrics and Gynecology

## 2021-09-24 DIAGNOSIS — Z5111 Encounter for antineoplastic chemotherapy: Secondary | ICD-10-CM | POA: Diagnosis present

## 2021-09-24 DIAGNOSIS — C541 Malignant neoplasm of endometrium: Secondary | ICD-10-CM | POA: Diagnosis not present

## 2021-09-24 DIAGNOSIS — C539 Malignant neoplasm of cervix uteri, unspecified: Secondary | ICD-10-CM | POA: Diagnosis not present

## 2021-09-24 DIAGNOSIS — C55 Malignant neoplasm of uterus, part unspecified: Secondary | ICD-10-CM

## 2021-09-24 LAB — COMPREHENSIVE METABOLIC PANEL
ALT: 28 U/L (ref 0–44)
AST: 24 U/L (ref 15–41)
Albumin: 3.6 g/dL (ref 3.5–5.0)
Alkaline Phosphatase: 87 U/L (ref 38–126)
Anion gap: 7 (ref 5–15)
BUN: 11 mg/dL (ref 8–23)
CO2: 25 mmol/L (ref 22–32)
Calcium: 8.7 mg/dL — ABNORMAL LOW (ref 8.9–10.3)
Chloride: 102 mmol/L (ref 98–111)
Creatinine, Ser: 1 mg/dL (ref 0.44–1.00)
GFR, Estimated: >60 mL/min (ref >60)
Glucose, Bld: 112 mg/dL — ABNORMAL HIGH (ref 70–99)
Potassium: 3.8 mmol/L (ref 3.5–5.1)
Sodium: 134 mmol/L — ABNORMAL LOW (ref 135–145)
Total Bilirubin: 0.2 mg/dL — ABNORMAL LOW (ref 0.3–1.2)
Total Protein: 6.6 g/dL (ref 6.5–8.1)

## 2021-09-24 LAB — CBC WITH DIFFERENTIAL/PLATELET
Eosinophils Absolute: 0.1 10*3/uL (ref 0.0–0.5)
Eosinophils Relative: 2 %
HCT: 31.4 % — ABNORMAL LOW (ref 36.0–46.0)
Hemoglobin: 10.2 g/dL — ABNORMAL LOW (ref 12.0–15.0)
Lymphocytes Relative: 51 %
Lymphs Abs: 1.2 10*3/uL (ref 0.7–4.0)
MCH: 27.8 pg (ref 26.0–34.0)
MCHC: 32.5 g/dL (ref 30.0–36.0)
MCV: 85.6 fL (ref 80.0–100.0)
Monocytes Absolute: 0.6 10*3/uL (ref 0.1–1.0)
Monocytes Relative: 23 %
Neutro Abs: 0.6 10*3/uL — ABNORMAL LOW (ref 1.7–7.7)
Neutrophils Relative %: 23 %
Platelets: 98 10*3/uL — ABNORMAL LOW (ref 150–400)
RBC: 3.67 MIL/uL — ABNORMAL LOW (ref 3.87–5.11)
RDW: 13.9 % (ref 11.5–15.5)
Smear Review: DECREASED
WBC: 2.4 10*3/uL — ABNORMAL LOW (ref 4.0–10.5)
nRBC: 0 % (ref 0.0–0.2)
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
## 2021-09-26 ENCOUNTER — Inpatient Hospital Stay (HOSPITAL_BASED_OUTPATIENT_CLINIC_OR_DEPARTMENT_OTHER): Payer: Medicare Other | Admitting: Obstetrics and Gynecology

## 2021-09-26 VITALS — BP 121/78 | HR 109 | Temp 98.7°F | Resp 20 | Wt 268.1 lb

## 2021-09-26 DIAGNOSIS — C55 Malignant neoplasm of uterus, part unspecified: Secondary | ICD-10-CM

## 2021-10-01 ENCOUNTER — Inpatient Hospital Stay (HOSPITAL_BASED_OUTPATIENT_CLINIC_OR_DEPARTMENT_OTHER): Payer: Medicare Other | Admitting: Oncology

## 2021-10-01 ENCOUNTER — Other Ambulatory Visit: Payer: Self-pay | Admitting: Oncology

## 2021-10-01 ENCOUNTER — Inpatient Hospital Stay: Payer: Medicare Other

## 2021-10-01 ENCOUNTER — Encounter: Payer: Self-pay | Admitting: Oncology

## 2021-10-01 VITALS — BP 129/75 | HR 94 | Temp 96.5°F | Resp 18 | Ht 67.5 in | Wt 274.0 lb

## 2021-10-01 VITALS — BP 138/78 | HR 75

## 2021-10-01 DIAGNOSIS — D701 Agranulocytosis secondary to cancer chemotherapy: Secondary | ICD-10-CM

## 2021-10-01 DIAGNOSIS — Z5111 Encounter for antineoplastic chemotherapy: Secondary | ICD-10-CM | POA: Diagnosis not present

## 2021-10-01 DIAGNOSIS — C55 Malignant neoplasm of uterus, part unspecified: Secondary | ICD-10-CM | POA: Diagnosis not present

## 2021-10-01 DIAGNOSIS — D638 Anemia in other chronic diseases classified elsewhere: Secondary | ICD-10-CM

## 2021-10-01 DIAGNOSIS — T451X5A Adverse effect of antineoplastic and immunosuppressive drugs, initial encounter: Secondary | ICD-10-CM

## 2021-10-01 DIAGNOSIS — M059 Rheumatoid arthritis with rheumatoid factor, unspecified: Secondary | ICD-10-CM

## 2021-10-01 LAB — CBC WITH DIFFERENTIAL/PLATELET
Abs Immature Granulocytes: 0.02 10*3/uL (ref 0.00–0.07)
Basophils Absolute: 0 10*3/uL (ref 0.0–0.1)
Basophils Relative: 1 %
Eosinophils Absolute: 0.1 10*3/uL (ref 0.0–0.5)
Eosinophils Relative: 3 %
HCT: 29.1 % — ABNORMAL LOW (ref 36.0–46.0)
Hemoglobin: 9.5 g/dL — ABNORMAL LOW (ref 12.0–15.0)
Immature Granulocytes: 1 %
Lymphocytes Relative: 35 %
Lymphs Abs: 1.1 10*3/uL (ref 0.7–4.0)
MCH: 28.6 pg (ref 26.0–34.0)
MCHC: 32.6 g/dL (ref 30.0–36.0)
MCV: 87.7 fL (ref 80.0–100.0)
Monocytes Absolute: 0.5 10*3/uL (ref 0.1–1.0)
Monocytes Relative: 16 %
Neutro Abs: 1.5 10*3/uL — ABNORMAL LOW (ref 1.7–7.7)
Neutrophils Relative %: 44 %
Platelets: 131 10*3/uL — ABNORMAL LOW (ref 150–400)
RBC: 3.32 MIL/uL — ABNORMAL LOW (ref 3.87–5.11)
RDW: 14.6 % (ref 11.5–15.5)
WBC: 3.2 10*3/uL — ABNORMAL LOW (ref 4.0–10.5)
nRBC: 0 % (ref 0.0–0.2)

## 2021-10-01 LAB — COMPREHENSIVE METABOLIC PANEL
ALT: 21 U/L (ref 0–44)
AST: 25 U/L (ref 15–41)
Albumin: 3.4 g/dL — ABNORMAL LOW (ref 3.5–5.0)
Alkaline Phosphatase: 87 U/L (ref 38–126)
Anion gap: 6 (ref 5–15)
BUN: 19 mg/dL (ref 8–23)
CO2: 25 mmol/L (ref 22–32)
Calcium: 8.6 mg/dL — ABNORMAL LOW (ref 8.9–10.3)
Chloride: 106 mmol/L (ref 98–111)
Creatinine, Ser: 1.1 mg/dL — ABNORMAL HIGH (ref 0.44–1.00)
GFR, Estimated: 55 mL/min — ABNORMAL LOW (ref >60)
Glucose, Bld: 150 mg/dL — ABNORMAL HIGH (ref 70–99)
Potassium: 3.9 mmol/L (ref 3.5–5.1)
Sodium: 137 mmol/L (ref 135–145)
Total Bilirubin: 0.4 mg/dL (ref 0.3–1.2)
Total Protein: 7 g/dL (ref 6.5–8.1)

## 2021-10-01 MED ORDER — SODIUM CHLORIDE 0.9 % IV SOLN
Freq: Once | INTRAVENOUS | Status: AC
  Filled 2021-10-01: qty 250

## 2021-10-01 MED ORDER — SODIUM CHLORIDE 0.9 % IV SOLN
770.0000 mg | Freq: Once | INTRAVENOUS | Status: AC
  Administered 2021-10-01: 770 mg via INTRAVENOUS
  Filled 2021-10-01: qty 77

## 2021-10-01 MED ORDER — PALONOSETRON HCL INJECTION 0.25 MG/5ML
0.2500 mg | Freq: Once | INTRAVENOUS | Status: AC
  Administered 2021-10-01: 0.25 mg via INTRAVENOUS
  Filled 2021-10-01: qty 5

## 2021-10-01 MED ORDER — DIPHENHYDRAMINE HCL 50 MG/ML IJ SOLN
50.0000 mg | Freq: Once | INTRAMUSCULAR | Status: AC
  Administered 2021-10-01: 50 mg via INTRAVENOUS
  Filled 2021-10-01: qty 1

## 2021-10-01 MED ORDER — SODIUM CHLORIDE 0.9 % IV SOLN
150.0000 mg | Freq: Once | INTRAVENOUS | Status: AC
  Administered 2021-10-01: 150 mg via INTRAVENOUS
  Filled 2021-10-01: qty 150

## 2021-10-01 MED ORDER — FAMOTIDINE IN NACL 20-0.9 MG/50ML-% IV SOLN
20.0000 mg | Freq: Once | INTRAVENOUS | Status: AC
  Administered 2021-10-01: 20 mg via INTRAVENOUS
  Filled 2021-10-01: qty 50

## 2021-10-01 MED ORDER — SODIUM CHLORIDE 0.9 % IV SOLN
10.0000 mg | Freq: Once | INTRAVENOUS | Status: AC
  Administered 2021-10-01: 10 mg via INTRAVENOUS
  Filled 2021-10-01: qty 10

## 2021-10-01 MED ORDER — SODIUM CHLORIDE 0.9 % IV SOLN
135.0000 mg/m2 | Freq: Once | INTRAVENOUS | Status: AC
  Administered 2021-10-01: 324 mg via INTRAVENOUS
  Filled 2021-10-01: qty 54
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
## 2021-10-01 NOTE — Patient Instructions (Signed)
[facility] CANCER CTR AT [place]  Discharge Instructions: ?Thank you for choosing [facility] to provide your oncology and hematology care.  ?If you have a lab appointment with the [facility], please go directly to the [facility] and check in at the registration area. ? ?Wear comfortable clothing and clothing appropriate for easy access to any Portacath or PICC line.  ? ?We strive to give you quality time with your provider. You may need to reschedule your appointment if you arrive late (15 or more minutes).  Arriving late affects you and other patients whose appointments are after yours.  Also, if you miss three or more appointments without notifying the office, you may be dismissed from the clinic at the provider?s discretion.    ?  ?For prescription refill requests, have your pharmacy contact our office and allow 72 hours for refills to be completed.   ? ?Today you received the following chemotherapy and/or immunotherapy agents : CARBOplatin & PACLitaxel  ?  ?To help prevent nausea and vomiting after your treatment, we encourage you to take your nausea medication as directed. ? ?BELOW ARE SYMPTOMS THAT SHOULD BE REPORTED IMMEDIATELY: ?*FEVER GREATER THAN 100.4 F (38 ?C) OR HIGHER ?*CHILLS OR SWEATING ?*NAUSEA AND VOMITING THAT IS NOT CONTROLLED WITH YOUR NAUSEA MEDICATION ?*UNUSUAL SHORTNESS OF BREATH ?*UNUSUAL BRUISING OR BLEEDING ?*URINARY PROBLEMS (pain or burning when urinating, or frequent urination) ?*BOWEL PROBLEMS (unusual diarrhea, constipation, pain near the anus) ?TENDERNESS IN MOUTH AND THROAT WITH OR WITHOUT PRESENCE OF ULCERS (sore throat, sores in mouth, or a toothache) ?UNUSUAL RASH, SWELLING OR PAIN  ?UNUSUAL VAGINAL DISCHARGE OR ITCHING  ? ?Items with * indicate a potential emergency and should be followed up as soon as possible or go to the Emergency Department if any problems should occur. ? ?Please show the CHEMOTHERAPY ALERT CARD or IMMUNOTHERAPY ALERT CARD  at check-in to the Emergency Department and triage nurse. ? ?Should you have questions after your visit or need to cancel or reschedule your appointment, please contact [facility] CANCER [place] AT [place]  [phone] and follow the prompts.  Office hours are 8:00 a.m. to 4:30 p.m. Monday - Friday. Please note that voicemails left after 4:00 p.m. may not be returned until the following business day.  We are closed weekends and major holidays. You have access to a nurse at all times for urgent questions. Please call the main number to the clinic [phone] and follow the prompts. ? ?For any non-urgent questions, you may also contact your provider using MyChart. We now offer e-Visits for anyone 41 and older to request care online for non-urgent symptoms. For details visit [email or web address]. ?  ?Also download the MyChart app! Go to the app store, search "MyChart", open the app, select [facility], and log in with your MyChart username and password. ? ?Due to Covid, a mask is required upon entering the hospital/clinic. If you do not have a mask, one will be given to you upon arrival. For doctor visits, patients may have 1 support person aged 31 or older with them. For treatment visits, patients cannot have anyone with them due to current Covid guidelines and our immunocompromised population.  ?

## 2021-10-01 NOTE — Progress Notes (Signed)
Patient tolerated carbo & Taxol infusion well, no concerns voiced. Patient stable at discharge. Refused AVS .   ?

## 2021-10-02 ENCOUNTER — Encounter: Payer: Self-pay | Admitting: Oncology

## 2021-10-04 ENCOUNTER — Ambulatory Visit: Payer: Medicare Other

## 2021-10-11 ENCOUNTER — Ambulatory Visit (INDEPENDENT_AMBULATORY_CARE_PROVIDER_SITE_OTHER): Payer: Medicare Other

## 2021-10-11 DIAGNOSIS — Z780 Asymptomatic menopausal state: Secondary | ICD-10-CM | POA: Diagnosis not present

## 2021-10-11 DIAGNOSIS — Z0000 Encounter for general adult medical examination without abnormal findings: Secondary | ICD-10-CM | POA: Diagnosis not present

## 2021-10-11 NOTE — Patient Instructions (Signed)
Taylor Perry , ?Thank you for taking time to come for your Medicare Wellness Visit. I appreciate your ongoing commitment to your health goals. Please review the following plan we discussed and let me know if I can assist you in the future.  ? ?Screening recommendations/referrals: ?Colonoscopy: Cologuard done 09/06/21. Repeat 08/2024 ?Mammogram: done 02/14/20. Please call [phone] to schedule your mammogram and bone density screening ?Bone Density: done 01/04/19.  ?Recommended yearly ophthalmology/optometry visit for glaucoma screening and checkup ?Recommended yearly dental visit for hygiene and checkup ? ?Vaccinations: ?Influenza vaccine: done 04/24/21 ?Pneumococcal vaccine: done 01/08/18 ?Tdap vaccine: done 02/11/12 ?Shingles vaccine: Shingrix discussed. Please contact your pharmacy for coverage information.  ?Covid-19: done 09/01/19, 10/04/19, 04/19/20 & 02/02/21 ? ?Advanced directives: Advance directive discussed with you today. I have provided a copy for you to complete at home and have notarized. Once this is complete please bring a copy in to our office so we can scan it into your chart.  ? ?Conditions/risks identified: Keep up the great work! ? ?Next appointment: Follow up in one year for your annual wellness visit  ? ? ?Preventive Care 30 Years and Older, Female ?Preventive care refers to lifestyle choices and visits with your health care provider that can promote health and wellness. ?What does preventive care include? ?A yearly physical exam. This is also called an annual well check. ?Dental exams once or twice a year. ?Routine eye exams. Ask your health care provider how often you should have your eyes checked. ?Personal lifestyle choices, including: ?Daily care of your teeth and gums. ?Regular physical activity. ?Eating a healthy diet. ?Avoiding tobacco and drug use. ?Limiting alcohol use. ?Practicing safe sex. ?Taking low-dose aspirin every day. ?Taking vitamin and mineral supplements as recommended by your  health care provider. ?What happens during an annual well check? ?The services and screenings done by your health care provider during your annual well check will depend on your age, overall health, lifestyle risk factors, and family history of disease. ?Counseling  ?Your health care provider may ask you questions about your: ?Alcohol use. ?Tobacco use. ?Drug use. ?Emotional well-being. ?Home and relationship well-being. ?Sexual activity. ?Eating habits. ?History of falls. ?Memory and ability to understand (cognition). ?Work and work Statistician. ?Reproductive health. ?Screening  ?You may have the following tests or measurements: ?Height, weight, and BMI. ?Blood pressure. ?Lipid and cholesterol levels. These may be checked every 5 years, or more frequently if you are over 65 years old. ?Skin check. ?Lung cancer screening. You may have this screening every year starting at age 65 if you have a 30-pack-year history of smoking and currently smoke or have quit within the past 15 years. ?Fecal occult blood test (FOBT) of the stool. You may have this test every year starting at age 34. ?Flexible sigmoidoscopy or colonoscopy. You may have a sigmoidoscopy every 5 years or a colonoscopy every 10 years starting at age 39. ?Hepatitis C blood test. ?Hepatitis B blood test. ?Sexually transmitted disease (STD) testing. ?Diabetes screening. This is done by checking your blood sugar (glucose) after you have not eaten for a while (fasting). You may have this done every 1-3 years. ?Bone density scan. This is done to screen for osteoporosis. You may have this done starting at age 33. ?Mammogram. This may be done every 1-2 years. Talk to your health care provider about how often you should have regular mammograms. ?Talk with your health care provider about your test results, treatment options, and if necessary, the need for more tests. ?Vaccines  ?  Your health care provider may recommend certain vaccines, such as: ?Influenza vaccine.  This is recommended every year. ?Tetanus, diphtheria, and acellular pertussis (Tdap, Td) vaccine. You may need a Td booster every 10 years. ?Zoster vaccine. You may need this after age 26. ?Pneumococcal 13-valent conjugate (PCV13) vaccine. One dose is recommended after age 60. ?Pneumococcal polysaccharide (PPSV23) vaccine. One dose is recommended after age 84. ?Talk to your health care provider about which screenings and vaccines you need and how often you need them. ?This information is not intended to replace advice given to you by your health care provider. Make sure you discuss any questions you have with your health care provider. ?Document Released: 07/07/2015 Document Revised: 02/28/2016 Document Reviewed: 04/11/2015 ?Elsevier Interactive Patient Education ? 2017 [employer]. ? ?Fall Prevention in the Home ?Falls can cause injuries. They can happen to people of all ages. There are many things you can do to make your home safe and to help prevent falls. ?What can I do on the outside of my home? ?Regularly fix the edges of walkways and driveways and fix any cracks. ?Remove anything that might make you trip as you walk through a door, such as a raised step or threshold. ?Trim any bushes or trees on the path to your home. ?Use bright outdoor lighting. ?Clear any walking paths of anything that might make someone trip, such as rocks or tools. ?Regularly check to see if handrails are loose or broken. Make sure that both sides of any steps have handrails. ?Any raised decks and porches should have guardrails on the edges. ?Have any leaves, snow, or ice cleared regularly. ?Use sand or salt on walking paths during winter. ?Clean up any spills in your garage right away. This includes oil or grease spills. ?What can I do in the bathroom? ?Use night lights. ?Install grab bars by the toilet and in the tub and shower. Do not use towel bars as grab bars. ?Use non-skid mats or decals in the tub or shower. ?If you need to sit  down in the shower, use a plastic, non-slip stool. ?Keep the floor dry. Clean up any water that spills on the floor as soon as it happens. ?Remove soap buildup in the tub or shower regularly. ?Attach bath mats securely with double-sided non-slip rug tape. ?Do not have throw rugs and other things on the floor that can make you trip. ?What can I do in the bedroom? ?Use night lights. ?Make sure that you have a light by your bed that is easy to reach. ?Do not use any sheets or blankets that are too big for your bed. They should not hang down onto the floor. ?Have a firm chair that has side arms. You can use this for support while you get dressed. ?Do not have throw rugs and other things on the floor that can make you trip. ?What can I do in the kitchen? ?Clean up any spills right away. ?Avoid walking on wet floors. ?Keep items that you use a lot in easy-to-reach places. ?If you need to reach something above you, use a strong step stool that has a grab bar. ?Keep electrical cords out of the way. ?Do not use floor polish or wax that makes floors slippery. If you must use wax, use non-skid floor wax. ?Do not have throw rugs and other things on the floor that can make you trip. ?What can I do with my stairs? ?Do not leave any items on the stairs. ?Make sure that there are  handrails on both sides of the stairs and use them. Fix handrails that are broken or loose. Make sure that handrails are as long as the stairways. ?Check any carpeting to make sure that it is firmly attached to the stairs. Fix any carpet that is loose or worn. ?Avoid having throw rugs at the top or bottom of the stairs. If you do have throw rugs, attach them to the floor with carpet tape. ?Make sure that you have a light switch at the top of the stairs and the bottom of the stairs. If you do not have them, ask someone to add them for you. ?What else can I do to help prevent falls? ?Wear shoes that: ?Do not have high heels. ?Have rubber bottoms. ?Are  comfortable and fit you well. ?Are closed at the toe. Do not wear sandals. ?If you use a stepladder: ?Make sure that it is fully opened. Do not climb a closed stepladder. ?Make sure that both sides of the stepl
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
## 2021-10-14 ENCOUNTER — Encounter: Payer: Self-pay | Admitting: Oncology

## 2021-10-15 ENCOUNTER — Other Ambulatory Visit: Payer: Self-pay | Admitting: Oncology

## 2021-10-15 MED ORDER — GABAPENTIN 100 MG PO CAPS: 100.0000 mg | ORAL_CAPSULE | Freq: Every day | ORAL | 0 refills | Status: DC

## 2021-10-15 NOTE — Telephone Encounter (Signed)
Patient had carbo taxol on 4/10. Please advise.  ?

## 2021-10-16 ENCOUNTER — Encounter (INDEPENDENT_AMBULATORY_CARE_PROVIDER_SITE_OTHER): Payer: Medicare Other

## 2021-10-16 DIAGNOSIS — Z0000 Encounter for general adult medical examination without abnormal findings: Secondary | ICD-10-CM

## 2021-10-16 NOTE — Progress Notes (Signed)
Erroneous encounter

## 2021-10-22 ENCOUNTER — Inpatient Hospital Stay: Payer: Medicare Other | Attending: Obstetrics and Gynecology

## 2021-10-22 ENCOUNTER — Encounter: Payer: Self-pay | Admitting: Oncology

## 2021-10-22 ENCOUNTER — Inpatient Hospital Stay: Payer: Medicare Other

## 2021-10-22 ENCOUNTER — Inpatient Hospital Stay (HOSPITAL_BASED_OUTPATIENT_CLINIC_OR_DEPARTMENT_OTHER): Payer: Medicare Other | Admitting: Oncology

## 2021-10-22 VITALS — BP 137/90 | HR 110 | Temp 97.4°F | Wt 269.0 lb

## 2021-10-22 VITALS — HR 97

## 2021-10-22 DIAGNOSIS — K449 Diaphragmatic hernia without obstruction or gangrene: Secondary | ICD-10-CM | POA: Diagnosis not present

## 2021-10-22 DIAGNOSIS — L4050 Arthropathic psoriasis, unspecified: Secondary | ICD-10-CM | POA: Diagnosis not present

## 2021-10-22 DIAGNOSIS — Z5111 Encounter for antineoplastic chemotherapy: Secondary | ICD-10-CM

## 2021-10-22 DIAGNOSIS — T451X5A Adverse effect of antineoplastic and immunosuppressive drugs, initial encounter: Secondary | ICD-10-CM

## 2021-10-22 DIAGNOSIS — M059 Rheumatoid arthritis with rheumatoid factor, unspecified: Secondary | ICD-10-CM | POA: Insufficient documentation

## 2021-10-22 DIAGNOSIS — E876 Hypokalemia: Secondary | ICD-10-CM | POA: Insufficient documentation

## 2021-10-22 DIAGNOSIS — C55 Malignant neoplasm of uterus, part unspecified: Secondary | ICD-10-CM

## 2021-10-22 DIAGNOSIS — Z6841 Body Mass Index (BMI) 40.0 and over, adult: Secondary | ICD-10-CM | POA: Diagnosis not present

## 2021-10-22 DIAGNOSIS — D638 Anemia in other chronic diseases classified elsewhere: Secondary | ICD-10-CM

## 2021-10-22 DIAGNOSIS — D701 Agranulocytosis secondary to cancer chemotherapy: Secondary | ICD-10-CM

## 2021-10-22 DIAGNOSIS — E039 Hypothyroidism, unspecified: Secondary | ICD-10-CM | POA: Diagnosis not present

## 2021-10-22 DIAGNOSIS — Z90710 Acquired absence of both cervix and uterus: Secondary | ICD-10-CM | POA: Insufficient documentation

## 2021-10-22 DIAGNOSIS — Z90722 Acquired absence of ovaries, bilateral: Secondary | ICD-10-CM | POA: Insufficient documentation

## 2021-10-22 DIAGNOSIS — Z5189 Encounter for other specified aftercare: Secondary | ICD-10-CM | POA: Insufficient documentation

## 2021-10-22 LAB — COMPREHENSIVE METABOLIC PANEL
ALT: 20 U/L (ref 0–44)
AST: 29 U/L (ref 15–41)
Albumin: 3.5 g/dL (ref 3.5–5.0)
Alkaline Phosphatase: 92 U/L (ref 38–126)
Anion gap: 7 (ref 5–15)
BUN: 17 mg/dL (ref 8–23)
CO2: 24 mmol/L (ref 22–32)
Calcium: 8.9 mg/dL (ref 8.9–10.3)
Chloride: 104 mmol/L (ref 98–111)
Creatinine, Ser: 1.02 mg/dL — ABNORMAL HIGH (ref 0.44–1.00)
GFR, Estimated: 60 mL/min — ABNORMAL LOW (ref >60)
Glucose, Bld: 152 mg/dL — ABNORMAL HIGH (ref 70–99)
Potassium: 3.4 mmol/L — ABNORMAL LOW (ref 3.5–5.1)
Sodium: 135 mmol/L (ref 135–145)
Total Bilirubin: 0.5 mg/dL (ref 0.3–1.2)
Total Protein: 7.4 g/dL (ref 6.5–8.1)

## 2021-10-22 LAB — CBC WITH DIFFERENTIAL/PLATELET
Abs Immature Granulocytes: 0.01 10*3/uL (ref 0.00–0.07)
Basophils Absolute: 0 10*3/uL (ref 0.0–0.1)
Basophils Relative: 0 %
Eosinophils Absolute: 0.1 10*3/uL (ref 0.0–0.5)
Eosinophils Relative: 2 %
HCT: 28.8 % — ABNORMAL LOW (ref 36.0–46.0)
Hemoglobin: 9.4 g/dL — ABNORMAL LOW (ref 12.0–15.0)
Immature Granulocytes: 0 %
Lymphocytes Relative: 31 %
Lymphs Abs: 1 10*3/uL (ref 0.7–4.0)
MCH: 27.9 pg (ref 26.0–34.0)
MCHC: 32.6 g/dL (ref 30.0–36.0)
MCV: 85.5 fL (ref 80.0–100.0)
Monocytes Absolute: 0.4 10*3/uL (ref 0.1–1.0)
Monocytes Relative: 13 %
Neutro Abs: 1.7 10*3/uL (ref 1.7–7.7)
Neutrophils Relative %: 54 %
Platelets: 99 10*3/uL — ABNORMAL LOW (ref 150–400)
RBC: 3.37 MIL/uL — ABNORMAL LOW (ref 3.87–5.11)
RDW: 15.3 % (ref 11.5–15.5)
WBC: 3.1 10*3/uL — ABNORMAL LOW (ref 4.0–10.5)
nRBC: 0 % (ref 0.0–0.2)

## 2021-10-22 MED ORDER — SODIUM CHLORIDE 0.9 % IV SOLN
150.0000 mg | Freq: Once | INTRAVENOUS | Status: AC
  Administered 2021-10-22: 150 mg via INTRAVENOUS
  Filled 2021-10-22: qty 150

## 2021-10-22 MED ORDER — DIPHENHYDRAMINE HCL 50 MG/ML IJ SOLN
50.0000 mg | Freq: Once | INTRAMUSCULAR | Status: AC
  Administered 2021-10-22: 50 mg via INTRAVENOUS
  Filled 2021-10-22: qty 1

## 2021-10-22 MED ORDER — SODIUM CHLORIDE 0.9 % IV SOLN
10.0000 mg | Freq: Once | INTRAVENOUS | Status: AC
  Administered 2021-10-22: 10 mg via INTRAVENOUS
  Filled 2021-10-22: qty 10

## 2021-10-22 MED ORDER — POTASSIUM CHLORIDE CRYS ER 10 MEQ PO TBCR: 10.0000 meq | EXTENDED_RELEASE_TABLET | Freq: Every day | ORAL | 0 refills | Status: DC

## 2021-10-22 MED ORDER — SODIUM CHLORIDE 0.9 % IV SOLN
Freq: Once | INTRAVENOUS | Status: AC
  Filled 2021-10-22: qty 250

## 2021-10-22 MED ORDER — PALONOSETRON HCL INJECTION 0.25 MG/5ML
0.2500 mg | Freq: Once | INTRAVENOUS | Status: AC
  Administered 2021-10-22: 0.25 mg via INTRAVENOUS
  Filled 2021-10-22: qty 5

## 2021-10-22 MED ORDER — SODIUM CHLORIDE 0.9 % IV SOLN
135.0000 mg/m2 | Freq: Once | INTRAVENOUS | Status: AC
  Administered 2021-10-22: 324 mg via INTRAVENOUS
  Filled 2021-10-22: qty 54

## 2021-10-22 MED ORDER — FAMOTIDINE IN NACL 20-0.9 MG/50ML-% IV SOLN
20.0000 mg | Freq: Once | INTRAVENOUS | Status: AC
  Administered 2021-10-22: 20 mg via INTRAVENOUS
  Filled 2021-10-22: qty 50

## 2021-10-22 MED ORDER — SODIUM CHLORIDE 0.9 % IV SOLN
766.8000 mg | Freq: Once | INTRAVENOUS | Status: AC
  Administered 2021-10-22: 770 mg via INTRAVENOUS
  Filled 2021-10-22: qty 77
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
## 2021-10-22 NOTE — Patient Instructions (Signed)
[facility] AT [place]  Discharge Instructions: ?Thank you for choosing [facility] to provide your oncology and hematology care.  ?If you have a lab appointment with the [facility], please go directly to the [facility] and check in at the registration area. ? ?Wear comfortable clothing and clothing appropriate for easy access to any Portacath or PICC line.  ? ?We strive to give you quality time with your provider. You may need to reschedule your appointment if you arrive late (15 or more minutes).  Arriving late affects you and other patients whose appointments are after yours.  Also, if you miss three or more appointments without notifying the office, you may be dismissed from the clinic at the provider?s discretion.    ?  ?For prescription refill requests, have your pharmacy contact our office and allow 72 hours for refills to be completed.   ? ?Today you received the following chemotherapy and/or immunotherapy agents Taxol and Carboplatin     ?  ?To help prevent nausea and vomiting after your treatment, we encourage you to take your nausea medication as directed. ? ?BELOW ARE SYMPTOMS THAT SHOULD BE REPORTED IMMEDIATELY: ?*FEVER GREATER THAN 100.4 F (38 ?C) OR HIGHER ?*CHILLS OR SWEATING ?*NAUSEA AND VOMITING THAT IS NOT CONTROLLED WITH YOUR NAUSEA MEDICATION ?*UNUSUAL SHORTNESS OF BREATH ?*UNUSUAL BRUISING OR BLEEDING ?*URINARY PROBLEMS (pain or burning when urinating, or frequent urination) ?*BOWEL PROBLEMS (unusual diarrhea, constipation, pain near the anus) ?TENDERNESS IN MOUTH AND THROAT WITH OR WITHOUT PRESENCE OF ULCERS (sore throat, sores in mouth, or a toothache) ?UNUSUAL RASH, SWELLING OR PAIN  ?UNUSUAL VAGINAL DISCHARGE OR ITCHING  ? ?Items with * indicate a potential emergency and should be followed up as soon as possible or go to the Emergency Department if any problems should occur. ? ?Please show the CHEMOTHERAPY ALERT CARD or IMMUNOTHERAPY ALERT CARD at  check-in to the Emergency Department and triage nurse. ? ?Should you have questions after your visit or need to cancel or reschedule your appointment, please contact [facility] [place] AT [place]  [phone] and follow the prompts.  Office hours are 8:00 a.m. to 4:30 p.m. Monday - Friday. Please note that voicemails left after 4:00 p.m. may not be returned until the following business day.  We are closed weekends and major holidays. You have access to a nurse at all times for urgent questions. Please call the main number to the clinic [phone] and follow the prompts. ? ?For any non-urgent questions, you may also contact your provider using MyChart. We now offer e-Visits for anyone 6 and older to request care online for non-urgent symptoms. For details visit [email or web address]. ?  ?Also download the MyChart app! Go to the app store, search "MyChart", open the app, select [facility], and log in with your MyChart username and password. ? ?Due to Covid, a mask is required upon entering the hospital/clinic. If you do not have a mask, one will be given to you upon arrival. For doctor visits, patients may have 1 support person aged 65 or older with them. For treatment visits, patients cannot have anyone with them due to current Covid guidelines and our immunocompromised population.  ?

## 2021-10-22 NOTE — Progress Notes (Signed)
PLTs 99. Per Benjamine Mola RN per Dr. Tasia Catchings okay to proceed with scheduled Taxol/Carboplatin treatment.  ? ?

## 2021-10-24 ENCOUNTER — Inpatient Hospital Stay: Payer: Medicare Other

## 2021-10-24 DIAGNOSIS — Z5111 Encounter for antineoplastic chemotherapy: Secondary | ICD-10-CM | POA: Diagnosis not present

## 2021-10-24 DIAGNOSIS — C55 Malignant neoplasm of uterus, part unspecified: Secondary | ICD-10-CM

## 2021-10-24 LAB — SAMPLE TO BLOOD BANK

## 2021-10-24 MED ORDER — PEGFILGRASTIM-BMEZ 6 MG/0.6ML ~~LOC~~ SOSY
6.0000 mg | PREFILLED_SYRINGE | Freq: Once | SUBCUTANEOUS | Status: AC
  Administered 2021-10-24: 6 mg via SUBCUTANEOUS
  Filled 2021-10-24: qty 0.6

## 2021-10-30 ENCOUNTER — Other Ambulatory Visit: Payer: Self-pay | Admitting: Family Medicine

## 2021-10-30 DIAGNOSIS — E1169 Type 2 diabetes mellitus with other specified complication: Secondary | ICD-10-CM

## 2021-10-30 DIAGNOSIS — I10 Essential (primary) hypertension: Secondary | ICD-10-CM

## 2021-10-30 DIAGNOSIS — R809 Proteinuria, unspecified: Secondary | ICD-10-CM

## 2021-11-05 ENCOUNTER — Ambulatory Visit
Admission: RE | Admit: 2021-11-05 | Discharge: 2021-11-05 | Disposition: A | Discharge status: Home / Self Care | Payer: Medicare Other | Source: Ambulatory Visit | Attending: Oncology | Admitting: Oncology

## 2021-11-05 DIAGNOSIS — C55 Malignant neoplasm of uterus, part unspecified: Secondary | ICD-10-CM | POA: Insufficient documentation

## 2021-11-05 MED ORDER — IOHEXOL 300 MG/ML  SOLN
100.0000 mL | Freq: Once | INTRAMUSCULAR | Status: AC | PRN
  Administered 2021-11-05: 100 mL via INTRAVENOUS

## 2021-11-07 ENCOUNTER — Inpatient Hospital Stay (HOSPITAL_BASED_OUTPATIENT_CLINIC_OR_DEPARTMENT_OTHER): Payer: Medicare Other | Admitting: Obstetrics and Gynecology

## 2021-11-07 ENCOUNTER — Encounter: Payer: Self-pay | Admitting: Obstetrics and Gynecology

## 2021-11-07 VITALS — BP 128/72 | HR 115 | Temp 98.7°F | Resp 20 | Wt 262.7 lb

## 2021-11-07 DIAGNOSIS — C55 Malignant neoplasm of uterus, part unspecified: Secondary | ICD-10-CM

## 2021-11-07 DIAGNOSIS — Z5111 Encounter for antineoplastic chemotherapy: Secondary | ICD-10-CM | POA: Diagnosis not present

## 2021-11-07 NOTE — Progress Notes (Signed)
Gynecologic Oncology Interval Visit  ? ?Referring Provider: Dr. Jeannie Fend ? ?Chief Complaint:  Stage IIIC1 uterine carcinosarcoma ? ?Subjective:  ?Taylor Perry is a 71 y.o. P69 female who is seen in consultation from Dr. Amalia Hailey for [place] carcinosarcoma of the uterus, s/p EUA, RA-TLH, BSO, mapping biopsies, minilap, and cystoscopy on 08/15/21 followed by 3 cycles of carbo-taxol chemotherapy with interval imaging who returns to clinic for follow up.  ? ?Previously discussed possibility of 3 additional cycles of chemotherapy if no evidence of progression of disease after 3 cycles and consideration of external pelvic radiation in view of positive SLN and involvement of vaginal margin.  ? ?Treatment Summary:  ?07/26/21- PET (see below) ?08/15/21- Surgery- [place] [facility] ?08/26/21- MRI Liver- Negative for disease in liver ?09/10/21- Carbo (AUC 6) & Taxol (135 mg/m2) ?10/01/21- Carbo (AUC 6) & Taxol (135 mg/m2) ?10/22/21- Carbo (AUC 6) & Taxol (135 mg/m2) ? ?11/05/21- CT C/A/P W Contrast ?- No evidence of residual, recurrent, or progressive disease ?- left sided colonic diverticulosis w/o evidence of acute infection.  ? ?Humira treatments have been held during chemotherapy. ? ? ? ?Gynecologic Oncology History:  ?Presented for postmenopausal bleeding x 2 months. She had ultrasound that showed large mass in the uterus and some fluid collection/blood in the upper endometrium.  ? ?07/10/21- Endometrial Biopsy:  ?A. ENDOMETRIUM, BIOPSY:  ?- Poorly differentiated adenocarcinoma with mucinous features. ?The carcinoma is positive with vimentin and p16 and shows patchy positivity with CEA.  The carcinoma is mostly negative with estrogen receptor.  The immunophenotype is nonspecific and while an endometrial primary is favored, the immunophenotype is not definitive for endometrial origin.  The carcinoma is poorly differentiated and has high-grade features.  ? ?Pap: ASCUS ? ?[place] ?FINDINGS: ?Uterus  ?Measurements: 14.4 x 8.3 x  9.5 cm = volume: 591.2 mL. No fibroids or other mass visualized. ?Endometrium ?Thickness: At least 16 mm. Large heterogeneous echogenic mass within the endometrial canal measuring 6.8 x 8.7 x 5.9 cm. Moderate fluid within the fundal endometrium. ?Right ovary ?Not seen ?Left ovary ?Not seen ? ?IMPRESSION: ?1. Large heterogeneous 8.7 cm endometrial mass with associated endometrial thickening and moderate endometrial fluid at the fundus. In the setting of post-menopausal bleeding, endometrial sampling is indicated to exclude carcinoma. If results are benign, sonohysterogram should be considered for focal lesion work-up prior to hysteroscopy.   ?2. Nonvisualized ovaries ? ?07/26/21 ?PET/CT ?1. Intensely FDG avid mass distending the endocervical canal is identified compatible with primary uterine carcinoma. No highly suspicious findings identified to suggest nodal metastasis or distant metastatic disease.  ?2. Small f ocus of increased uptake above background liver activity within the posteromedial right hepatic lobe. No corresponding CT abnormality is identified. Consider more definitive characterization ?with contrast enhanced MRI of the liver. ? ?In view of endocervical location of the cancer we ordered HPV CISH on the endometrial biopsy.  And this was negative suggesting a uterine primary.   ? ?She underwent EUA, RA-TLH, BSO, SLN mapping, bilateral pelvic SLN biopsies, washings, minilap, and cystoscopy with Dr. Theora Gianotti and Dr. Marcelline Mates on 08/15/21.  ? ?Pathology: 08/15/21 ?DIAGNOSIS:  ?A. UTERUS AND CERVIX WITH BILATERAL FALLOPIAN TUBES AND OVARIES; TOTAL HYSTERECTOMY WITH BILATERAL SALPINGO-OOPHORECTOMY:  ?- UTERUS AND CERVIX: CARCINOSARCOMA (MALIGNANT MIXED MULLERIAN TUMOR).  ?- SEE CANCER SUMMARY BELOW.  ?- BILATERAL FALLOPIAN TUBES: NO SIGNIFICANT PATHOLOGIC ALTERATION.  ?- BILATERAL OVARIES: NO SIGNIFICANT PATHOLOGIC ALTERATION.  ? ?B. SENTINEL LYMPH NODE, LEFT ILIAC VEIN; EXCISIONAL BIOPSY:  ?- METASTATIC  CARCINOSARCOMA INVOLVING  ONE OF TWO LYMPH NODES (1/2).  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 ? ?C. SENTINEL LYMPH NODE, RIGHT ILIAC VEIN; EXCISIONAL BIOPSY:  ?- ONE LYMPH NODE, NEGATIVE FOR MALIGNANCY (0/1).  ? ?D. VAGINAL CUFF; BIOPSY:  ?- POSITIVE FOR CARCINOSARCOMA.  ? ?CASE SUMMARY: (ENDOMETRIUM)  ?Standard(s): AJCC-UICC 8, FIGO Cancer Report 2018  ? ?SPECIMEN  ?Procedure: Total hysterectomy, bilateral salpingo-oophorectomy, sentinel lymph node biopsies, vaginal cuff biopsy  ? ?TUMOR  ?Tumor Size: Greatest dimension: 12.3 x 7.7 x 4.2 cm  ?Histologic Type: Carcinosarcoma  ?Histologic Grade: Not applicable  ?Myometrial Invasion: Present, greater than 50%  ?Uterine Serosa Involvement: Not identified  ?Cervical Stromal Involvement: Present  ?Other Tissue/Organ Involvement: Not identified  ?Peritoneal/Ascitic Fluid: Negative for malignancy  ?Lymphatic and/or Vascular Invasion: Present  ? ?MARGINS  ?Margin Status: Margins involved by invasive carcinoma:  Ectocervical / vaginal cuff  ? ?REGIONAL LYMPH NODES  ?Regional Lymph Node Status: Regional lymph nodes present, tumor present in pelvic lymph nodes  ?Total number of pelvic nodes with macrometastases: 1  ?Laterality of pelvic nodes with tumor: Left iliac sentinel  ? ?Lymph nodes examined:  ?     Total number of pelvic nodes examined (sentinel and non-sentinel): 3  ?     Number of pelvic sentinel nodes examined: 3  ?     Total number of para-aortic nodes examined (sentinel and non-sentinel): 0  ?     Number of para-aortic sentinel nodes examined: 0  ? ?DISTANT METASTASIS  ?Distant Site(s) Involved, if applicable: Not applicable  ? ?PATHOLOGIC STAGE CLASSIFICATION (pTNM, AJCC 8th Edition): pT pN / FIGO  ?Modified Classification: Applicable  ?pT 3b (vaginal involvement)  ?T Suffix: Not applicable  ?Regional Lymph Nodes Modifier: sn  ?pN 1a  ?pM - Not applicable  ? ?FIGO Stage (2018 FIGO Cancer Report): IIIC1 ? ?DIAGNOSIS:  ?A. PELVIC WASHING:  ?- NEGATIVE;  NO EVIDENCE OF MALIGNANCY.  ?- BENIGN MESOTHELIAL CELLS.  ? ?ER/PR negative, p53 positive, HER-2 negative (1+) ? ?MLH1: Intact nuclear expression  ?MSH2: Intact nuclear expression  ?MSH6: Intact nuclear expression  ?PMS2: Intact nuclear expression  ? ? ?Problem List: ?Patient Active Problem List  ? Diagnosis Date Noted  ? Encounter for antineoplastic chemotherapy 09/10/2021  ? Counseling and coordination of care 08/22/2021  ? Carcinosarcoma of uterus ([place]) 08/22/2021  ? Goals of care, counseling/discussion 08/22/2021  ? Seropositive rheumatoid arthritis ([place]) 07/16/2018  ? Psoriatic arthritis ([place]) 07/16/2018  ? Osteoarthritis of both knees 03/04/2018  ? Morbid obesity with BMI of 40.0-44.9, adult ([place]) 02/17/2018  ? Anemia of chronic disease 10/23/2017  ? Benign cyst of right breast 08/18/2015  ? Controlled type 2 diabetes mellitus with microalbuminuria, without long-term current use of insulin ([place]) 07/14/2015  ? Perennial allergic rhinitis 05/26/2015  ? Benign essential HTN 03/12/2015  ? Dyslipidemia 03/12/2015  ? History of shingles 03/12/2015  ? History of radioactive iodine thyroid ablation 03/12/2015  ? 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 Microalbuminuria 03/12/2015  ? Localized osteoarthrosis, lower leg 03/12/2015  ? 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  ? Vitamin D deficiency 03/12/2015  ? ? ?Past Medical History: ?Past Medical History:  ?Diagnosis Date  ? Abnormal mammogram   ? Adult hypothyroidism   ? Anemia   ? h/o  ? Diabetes mellitus without complication ([place])   ? Dyslipidemia   ? Endometrial cancer ([place])   ? History of shingles   ? History of thyroid irradiation   ? Hyperlipidemia   ? Hypertelorism   ? Hypertension   ? Microalbuminuria   ?  Morbid obesity with BMI of 40.0-44.9, adult ([place])   ? Osteoarthritis of lower leg, localized   ? Pain in finger of right hand   ? atypical hand synovitis (Dr. Jefm Bryant)  ? Psoriasis   ? Psoriatic arthritis ([place])   ? Pterygium   ? Vitamin D deficiency   ? ? ?Past Surgical  History: ?Past Surgical History:  ?Procedure Laterality Date  ? COLONOSCOPY    ? CYSTOSCOPY  08/15/2021  ? Procedure: CYSTOSCOPY;  Surgeon: Gillis Ends, MD;  Location: [facility] ORS;  Service: Gynecology;;  ? Gerald Dexter

## 2021-11-09 MED FILL — Dexamethasone Sodium Phosphate Inj 100 MG/10ML: INTRAMUSCULAR | Qty: 1 | Status: AC

## 2021-11-09 MED FILL — Fosaprepitant Dimeglumine For IV Infusion 150 MG (Base Eq): INTRAVENOUS | Qty: 5 | Status: AC

## 2021-11-12 ENCOUNTER — Inpatient Hospital Stay: Payer: Medicare Other

## 2021-11-12 ENCOUNTER — Inpatient Hospital Stay (HOSPITAL_BASED_OUTPATIENT_CLINIC_OR_DEPARTMENT_OTHER): Payer: Medicare Other | Admitting: Oncology

## 2021-11-12 ENCOUNTER — Encounter: Payer: Self-pay | Admitting: Oncology

## 2021-11-12 VITALS — BP 129/76 | HR 111 | Temp 96.8°F | Wt 265.0 lb

## 2021-11-12 DIAGNOSIS — C55 Malignant neoplasm of uterus, part unspecified: Secondary | ICD-10-CM | POA: Diagnosis not present

## 2021-11-12 DIAGNOSIS — D6481 Anemia due to antineoplastic chemotherapy: Secondary | ICD-10-CM

## 2021-11-12 DIAGNOSIS — M059 Rheumatoid arthritis with rheumatoid factor, unspecified: Secondary | ICD-10-CM

## 2021-11-12 DIAGNOSIS — D701 Agranulocytosis secondary to cancer chemotherapy: Secondary | ICD-10-CM | POA: Insufficient documentation

## 2021-11-12 DIAGNOSIS — T451X5A Adverse effect of antineoplastic and immunosuppressive drugs, initial encounter: Secondary | ICD-10-CM | POA: Insufficient documentation

## 2021-11-12 DIAGNOSIS — Z5111 Encounter for antineoplastic chemotherapy: Secondary | ICD-10-CM | POA: Diagnosis not present

## 2021-11-12 DIAGNOSIS — D649 Anemia, unspecified: Secondary | ICD-10-CM | POA: Insufficient documentation

## 2021-11-12 LAB — CBC WITH DIFFERENTIAL/PLATELET
Abs Immature Granulocytes: 0.02 10*3/uL (ref 0.00–0.07)
Basophils Absolute: 0 10*3/uL (ref 0.0–0.1)
Basophils Relative: 0 %
Eosinophils Absolute: 0 10*3/uL (ref 0.0–0.5)
Eosinophils Relative: 1 %
HCT: 25.8 % — ABNORMAL LOW (ref 36.0–46.0)
Hemoglobin: 8.3 g/dL — ABNORMAL LOW (ref 12.0–15.0)
Immature Granulocytes: 1 %
Lymphocytes Relative: 30 %
Lymphs Abs: 1.2 10*3/uL (ref 0.7–4.0)
MCH: 28.8 pg (ref 26.0–34.0)
MCHC: 32.2 g/dL (ref 30.0–36.0)
MCV: 89.6 fL (ref 80.0–100.0)
Monocytes Absolute: 0.6 10*3/uL (ref 0.1–1.0)
Monocytes Relative: 15 %
Neutro Abs: 2.3 10*3/uL (ref 1.7–7.7)
Neutrophils Relative %: 53 %
Platelets: 117 10*3/uL — ABNORMAL LOW (ref 150–400)
RBC: 2.88 MIL/uL — ABNORMAL LOW (ref 3.87–5.11)
RDW: 18.3 % — ABNORMAL HIGH (ref 11.5–15.5)
WBC: 4.2 10*3/uL (ref 4.0–10.5)
nRBC: 0 % (ref 0.0–0.2)

## 2021-11-12 LAB — COMPREHENSIVE METABOLIC PANEL
ALT: 25 U/L (ref 0–44)
AST: 29 U/L (ref 15–41)
Albumin: 3.5 g/dL (ref 3.5–5.0)
Alkaline Phosphatase: 95 U/L (ref 38–126)
Anion gap: 8 (ref 5–15)
BUN: 14 mg/dL (ref 8–23)
CO2: 25 mmol/L (ref 22–32)
Calcium: 8.8 mg/dL — ABNORMAL LOW (ref 8.9–10.3)
Chloride: 106 mmol/L (ref 98–111)
Creatinine, Ser: 1.09 mg/dL — ABNORMAL HIGH (ref 0.44–1.00)
GFR, Estimated: 55 mL/min — ABNORMAL LOW (ref >60)
Glucose, Bld: 161 mg/dL — ABNORMAL HIGH (ref 70–99)
Potassium: 3.4 mmol/L — ABNORMAL LOW (ref 3.5–5.1)
Sodium: 139 mmol/L (ref 135–145)
Total Bilirubin: 0.4 mg/dL (ref 0.3–1.2)
Total Protein: 7.3 g/dL (ref 6.5–8.1)
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
## 2021-11-14 ENCOUNTER — Ambulatory Visit: Payer: Medicare Other

## 2021-11-14 LAB — SAMPLE TO BLOOD BANK

## 2021-11-20 MED FILL — Dexamethasone Sodium Phosphate Inj 100 MG/10ML: INTRAMUSCULAR | Qty: 1 | Status: AC

## 2021-11-20 MED FILL — Fosaprepitant Dimeglumine For IV Infusion 150 MG (Base Eq): INTRAVENOUS | Qty: 5 | Status: AC

## 2021-11-21 ENCOUNTER — Other Ambulatory Visit: Payer: Self-pay | Admitting: Oncology

## 2021-11-21 ENCOUNTER — Inpatient Hospital Stay: Payer: Medicare Other | Attending: Oncology

## 2021-11-21 ENCOUNTER — Inpatient Hospital Stay: Payer: Medicare Other

## 2021-11-21 VITALS — BP 111/69 | HR 97 | Temp 97.0°F | Resp 18 | Wt 262.1 lb

## 2021-11-21 DIAGNOSIS — C55 Malignant neoplasm of uterus, part unspecified: Secondary | ICD-10-CM | POA: Insufficient documentation

## 2021-11-21 DIAGNOSIS — Z5111 Encounter for antineoplastic chemotherapy: Secondary | ICD-10-CM | POA: Insufficient documentation

## 2021-11-21 LAB — SAMPLE TO BLOOD BANK

## 2021-11-21 LAB — COMPREHENSIVE METABOLIC PANEL
ALT: 23 U/L (ref 0–44)
AST: 30 U/L (ref 15–41)
Albumin: 3.4 g/dL — ABNORMAL LOW (ref 3.5–5.0)
Alkaline Phosphatase: 93 U/L (ref 38–126)
Anion gap: 7 (ref 5–15)
BUN: 16 mg/dL (ref 8–23)
CO2: 25 mmol/L (ref 22–32)
Calcium: 8.8 mg/dL — ABNORMAL LOW (ref 8.9–10.3)
Chloride: 105 mmol/L (ref 98–111)
Creatinine, Ser: 1.03 mg/dL — ABNORMAL HIGH (ref 0.44–1.00)
GFR, Estimated: 59 mL/min — ABNORMAL LOW (ref >60)
Glucose, Bld: 124 mg/dL — ABNORMAL HIGH (ref 70–99)
Potassium: 3.3 mmol/L — ABNORMAL LOW (ref 3.5–5.1)
Sodium: 137 mmol/L (ref 135–145)
Total Bilirubin: 0.2 mg/dL — ABNORMAL LOW (ref 0.3–1.2)
Total Protein: 7.3 g/dL (ref 6.5–8.1)

## 2021-11-21 LAB — CBC WITH DIFFERENTIAL/PLATELET
Abs Immature Granulocytes: 0.02 10*3/uL (ref 0.00–0.07)
Basophils Absolute: 0 10*3/uL (ref 0.0–0.1)
Basophils Relative: 1 %
Eosinophils Absolute: 0.1 10*3/uL (ref 0.0–0.5)
Eosinophils Relative: 2 %
HCT: 26.8 % — ABNORMAL LOW (ref 36.0–46.0)
Hemoglobin: 8.6 g/dL — ABNORMAL LOW (ref 12.0–15.0)
Immature Granulocytes: 1 %
Lymphocytes Relative: 27 %
Lymphs Abs: 1.1 10*3/uL (ref 0.7–4.0)
MCH: 29.2 pg (ref 26.0–34.0)
MCHC: 32.1 g/dL (ref 30.0–36.0)
MCV: 90.8 fL (ref 80.0–100.0)
Monocytes Absolute: 0.7 10*3/uL (ref 0.1–1.0)
Monocytes Relative: 17 %
Neutro Abs: 2.3 10*3/uL (ref 1.7–7.7)
Neutrophils Relative %: 52 %
Platelets: 232 10*3/uL (ref 150–400)
RBC: 2.95 MIL/uL — ABNORMAL LOW (ref 3.87–5.11)
RDW: 19.4 % — ABNORMAL HIGH (ref 11.5–15.5)
WBC: 4.2 10*3/uL (ref 4.0–10.5)
nRBC: 0 % (ref 0.0–0.2)

## 2021-11-21 MED ORDER — FAMOTIDINE IN NACL 20-0.9 MG/50ML-% IV SOLN
20.0000 mg | Freq: Once | INTRAVENOUS | Status: AC
  Administered 2021-11-21: 20 mg via INTRAVENOUS
  Filled 2021-11-21: qty 50

## 2021-11-21 MED ORDER — POTASSIUM CHLORIDE CRYS ER 10 MEQ PO TBCR: 10.0000 meq | EXTENDED_RELEASE_TABLET | Freq: Every day | ORAL | 0 refills | Status: DC

## 2021-11-21 MED ORDER — SODIUM CHLORIDE 0.9 % IV SOLN
770.0000 mg | Freq: Once | INTRAVENOUS | Status: AC
  Administered 2021-11-21: 770 mg via INTRAVENOUS
  Filled 2021-11-21: qty 77

## 2021-11-21 MED ORDER — SODIUM CHLORIDE 0.9% FLUSH
10.0000 mL | INTRAVENOUS | Status: DC | PRN
  Administered 2021-11-21: 10 mL
  Filled 2021-11-21: qty 10

## 2021-11-21 MED ORDER — DIPHENHYDRAMINE HCL 50 MG/ML IJ SOLN
50.0000 mg | Freq: Once | INTRAMUSCULAR | Status: AC
  Administered 2021-11-21: 50 mg via INTRAVENOUS
  Filled 2021-11-21: qty 1

## 2021-11-21 MED ORDER — SODIUM CHLORIDE 0.9 % IV SOLN
135.0000 mg/m2 | Freq: Once | INTRAVENOUS | Status: AC
  Administered 2021-11-21: 324 mg via INTRAVENOUS
  Filled 2021-11-21: qty 54

## 2021-11-21 MED ORDER — PALONOSETRON HCL INJECTION 0.25 MG/5ML
0.2500 mg | Freq: Once | INTRAVENOUS | Status: AC
  Administered 2021-11-21: 0.25 mg via INTRAVENOUS
  Filled 2021-11-21: qty 5

## 2021-11-21 MED ORDER — SODIUM CHLORIDE 0.9 % IV SOLN
Freq: Once | INTRAVENOUS | Status: AC
  Filled 2021-11-21: qty 250

## 2021-11-21 MED ORDER — SODIUM CHLORIDE 0.9 % IV SOLN
150.0000 mg | Freq: Once | INTRAVENOUS | Status: AC
  Administered 2021-11-21: 150 mg via INTRAVENOUS
  Filled 2021-11-21: qty 5
  Filled 2021-11-21: qty 150

## 2021-11-21 MED ORDER — SODIUM CHLORIDE 0.9 % IV SOLN
10.0000 mg | Freq: Once | INTRAVENOUS | Status: AC
  Administered 2021-11-21: 10 mg via INTRAVENOUS
  Filled 2021-11-21: qty 10
  Filled 2021-11-21: qty 1

## 2021-11-21 NOTE — Progress Notes (Signed)
Patient is here for Carbo/Taxol and K is 3.3 . MD/team notified

## 2021-11-21 NOTE — Patient Instructions (Signed)
[facility] AT [place]  Discharge Instructions: Thank you for choosing [facility] to provide your oncology and hematology care.  If you have a lab appointment with the [facility], please go directly to the [facility] and check in at the registration area.  Wear comfortable clothing and clothing appropriate for easy access to any Portacath or PICC line.   We strive to give you quality time with your provider. You may need to reschedule your appointment if you arrive late (15 or more minutes).  Arriving late affects you and other patients whose appointments are after yours.  Also, if you miss three or more appointments without notifying the office, you may be dismissed from the clinic at the provider's discretion.      For prescription refill requests, have your pharmacy contact our office and allow 72 hours for refills to be completed.    Today you received the following chemotherapy and/or immunotherapy agents : Carbo/ Taxol   To help prevent nausea and vomiting after your treatment, we encourage you to take your nausea medication as directed.  BELOW ARE SYMPTOMS THAT SHOULD BE REPORTED IMMEDIATELY: *FEVER GREATER THAN 100.4 F (38 C) OR HIGHER *CHILLS OR SWEATING *NAUSEA AND VOMITING THAT IS NOT CONTROLLED WITH YOUR NAUSEA MEDICATION *UNUSUAL SHORTNESS OF BREATH *UNUSUAL BRUISING OR BLEEDING *URINARY PROBLEMS (pain or burning when urinating, or frequent urination) *BOWEL PROBLEMS (unusual diarrhea, constipation, pain near the anus) TENDERNESS IN MOUTH AND THROAT WITH OR WITHOUT PRESENCE OF ULCERS (sore throat, sores in mouth, or a toothache) UNUSUAL RASH, SWELLING OR PAIN  UNUSUAL VAGINAL DISCHARGE OR ITCHING   Items with * indicate a potential emergency and should be followed up as soon as possible or go to the Emergency Department if any problems should occur.  Please show the CHEMOTHERAPY ALERT CARD or IMMUNOTHERAPY ALERT CARD at check-in to  the Emergency Department and triage nurse.  Should you have questions after your visit or need to cancel or reschedule your appointment, please contact [facility] [place] AT [place]  [phone] and follow the prompts.  Office hours are 8:00 a.m. to 4:30 p.m. Monday - Friday. Please note that voicemails left after 4:00 p.m. may not be returned until the following business day.  We are closed weekends and major holidays. You have access to a nurse at all times for urgent questions. Please call the main number to the clinic [phone] and follow the prompts.  For any non-urgent questions, you may also contact your provider using MyChart. We now offer e-Visits for anyone 10 and older to request care online for non-urgent symptoms. For details visit [email or web address].   Also download the MyChart app! Go to the app store, search "MyChart", open the app, select [facility], and log in with your MyChart username and password.  Due to Covid, a mask is required upon entering the hospital/clinic. If you do not have a mask, one will be given to you upon arrival. For doctor visits, patients may have 1 support person aged 51 or older with them. For treatment visits, patients cannot have anyone with them due to current Covid guidelines and our immunocompromised population.

## 2021-11-21 NOTE — Progress Notes (Signed)
Patient tolerated Carbo/ Taxol infusion well, no questions/concerns voiced. Patient stable at discharge. Refused AVS .

## 2021-11-22 ENCOUNTER — Other Ambulatory Visit: Payer: Self-pay | Admitting: Family Medicine

## 2021-11-22 DIAGNOSIS — J3089 Other allergic rhinitis: Secondary | ICD-10-CM

## 2021-11-22 DIAGNOSIS — E890 Postprocedural hypothyroidism: Secondary | ICD-10-CM

## 2021-11-23 ENCOUNTER — Inpatient Hospital Stay: Payer: Medicare Other | Attending: Oncology

## 2021-11-23 ENCOUNTER — Other Ambulatory Visit: Payer: Self-pay

## 2021-11-23 DIAGNOSIS — Z9079 Acquired absence of other genital organ(s): Secondary | ICD-10-CM | POA: Insufficient documentation

## 2021-11-23 DIAGNOSIS — Z90710 Acquired absence of both cervix and uterus: Secondary | ICD-10-CM | POA: Insufficient documentation

## 2021-11-23 DIAGNOSIS — Z5189 Encounter for other specified aftercare: Secondary | ICD-10-CM | POA: Insufficient documentation

## 2021-11-23 DIAGNOSIS — Z7952 Long term (current) use of systemic steroids: Secondary | ICD-10-CM | POA: Diagnosis not present

## 2021-11-23 DIAGNOSIS — T451X5A Adverse effect of antineoplastic and immunosuppressive drugs, initial encounter: Secondary | ICD-10-CM | POA: Diagnosis not present

## 2021-11-23 DIAGNOSIS — C55 Malignant neoplasm of uterus, part unspecified: Secondary | ICD-10-CM

## 2021-11-23 DIAGNOSIS — D6481 Anemia due to antineoplastic chemotherapy: Secondary | ICD-10-CM | POA: Diagnosis not present

## 2021-11-23 DIAGNOSIS — Z79899 Other long term (current) drug therapy: Secondary | ICD-10-CM | POA: Diagnosis not present

## 2021-11-23 DIAGNOSIS — E876 Hypokalemia: Secondary | ICD-10-CM | POA: Diagnosis not present

## 2021-11-23 DIAGNOSIS — Z5111 Encounter for antineoplastic chemotherapy: Secondary | ICD-10-CM | POA: Insufficient documentation

## 2021-11-23 DIAGNOSIS — Z90722 Acquired absence of ovaries, bilateral: Secondary | ICD-10-CM | POA: Insufficient documentation

## 2021-11-23 DIAGNOSIS — L4050 Arthropathic psoriasis, unspecified: Secondary | ICD-10-CM | POA: Diagnosis not present

## 2021-11-23 MED ORDER — PEGFILGRASTIM-BMEZ 6 MG/0.6ML ~~LOC~~ SOSY
6.0000 mg | PREFILLED_SYRINGE | Freq: Once | SUBCUTANEOUS | Status: AC
  Administered 2021-11-23: 6 mg via SUBCUTANEOUS
  Filled 2021-11-23: qty 0.6

## 2021-11-26 ENCOUNTER — Inpatient Hospital Stay: Payer: Medicare Other

## 2021-11-26 DIAGNOSIS — C55 Malignant neoplasm of uterus, part unspecified: Secondary | ICD-10-CM

## 2021-11-26 DIAGNOSIS — Z5111 Encounter for antineoplastic chemotherapy: Secondary | ICD-10-CM | POA: Diagnosis not present

## 2021-11-26 LAB — CBC WITH DIFFERENTIAL/PLATELET
Abs Immature Granulocytes: 0.5 10*3/uL — ABNORMAL HIGH (ref 0.00–0.07)
Basophils Absolute: 0.1 10*3/uL (ref 0.0–0.1)
Basophils Relative: 1 %
Eosinophils Absolute: 0 10*3/uL (ref 0.0–0.5)
Eosinophils Relative: 0 %
HCT: 27.5 % — ABNORMAL LOW (ref 36.0–46.0)
Hemoglobin: 9.1 g/dL — ABNORMAL LOW (ref 12.0–15.0)
Immature Granulocytes: 6 %
Lymphocytes Relative: 8 %
Lymphs Abs: 0.7 10*3/uL (ref 0.7–4.0)
MCH: 29.3 pg (ref 26.0–34.0)
MCHC: 33.1 g/dL (ref 30.0–36.0)
MCV: 88.4 fL (ref 80.0–100.0)
Monocytes Absolute: 0.1 10*3/uL (ref 0.1–1.0)
Monocytes Relative: 2 %
Neutro Abs: 6.7 10*3/uL (ref 1.7–7.7)
Neutrophils Relative %: 83 %
Platelets: 141 10*3/uL — ABNORMAL LOW (ref 150–400)
RBC: 3.11 MIL/uL — ABNORMAL LOW (ref 3.87–5.11)
RDW: 18.9 % — ABNORMAL HIGH (ref 11.5–15.5)
Smear Review: NORMAL
WBC: 8.1 10*3/uL (ref 4.0–10.5)
nRBC: 0 % (ref 0.0–0.2)

## 2021-11-26 LAB — SAMPLE TO BLOOD BANK

## 2021-11-30 ENCOUNTER — Encounter: Payer: Self-pay | Admitting: Oncology

## 2021-12-04 ENCOUNTER — Ambulatory Visit (INDEPENDENT_AMBULATORY_CARE_PROVIDER_SITE_OTHER): Payer: Medicare Other | Admitting: Family Medicine

## 2021-12-04 ENCOUNTER — Encounter: Payer: Self-pay | Admitting: Family Medicine

## 2021-12-04 VITALS — BP 134/70 | HR 101 | Resp 16 | Ht 67.0 in | Wt 259.0 lb

## 2021-12-04 DIAGNOSIS — Z1231 Encounter for screening mammogram for malignant neoplasm of breast: Secondary | ICD-10-CM

## 2021-12-04 DIAGNOSIS — E1129 Type 2 diabetes mellitus with other diabetic kidney complication: Secondary | ICD-10-CM | POA: Diagnosis not present

## 2021-12-04 DIAGNOSIS — C55 Malignant neoplasm of uterus, part unspecified: Secondary | ICD-10-CM

## 2021-12-04 DIAGNOSIS — E785 Hyperlipidemia, unspecified: Secondary | ICD-10-CM | POA: Insufficient documentation

## 2021-12-04 DIAGNOSIS — E032 Hypothyroidism due to medicaments and other exogenous substances: Secondary | ICD-10-CM | POA: Insufficient documentation

## 2021-12-04 DIAGNOSIS — R809 Proteinuria, unspecified: Secondary | ICD-10-CM

## 2021-12-04 DIAGNOSIS — M05741 Rheumatoid arthritis with rheumatoid factor of right hand without organ or systems involvement: Secondary | ICD-10-CM | POA: Diagnosis not present

## 2021-12-04 DIAGNOSIS — C775 Secondary and unspecified malignant neoplasm of intrapelvic lymph nodes: Secondary | ICD-10-CM

## 2021-12-04 DIAGNOSIS — D638 Anemia in other chronic diseases classified elsewhere: Secondary | ICD-10-CM

## 2021-12-04 DIAGNOSIS — I10 Essential (primary) hypertension: Secondary | ICD-10-CM | POA: Diagnosis not present

## 2021-12-04 DIAGNOSIS — E441 Mild protein-calorie malnutrition: Secondary | ICD-10-CM

## 2021-12-04 DIAGNOSIS — N1831 Chronic kidney disease, stage 3a: Secondary | ICD-10-CM | POA: Insufficient documentation

## 2021-12-04 DIAGNOSIS — D696 Thrombocytopenia, unspecified: Secondary | ICD-10-CM | POA: Insufficient documentation

## 2021-12-04 DIAGNOSIS — M059 Rheumatoid arthritis with rheumatoid factor, unspecified: Secondary | ICD-10-CM

## 2021-12-04 DIAGNOSIS — Q752 Hypertelorism: Secondary | ICD-10-CM

## 2021-12-04 DIAGNOSIS — E559 Vitamin D deficiency, unspecified: Secondary | ICD-10-CM

## 2021-12-04 DIAGNOSIS — L03114 Cellulitis of left upper limb: Secondary | ICD-10-CM

## 2021-12-04 DIAGNOSIS — R202 Paresthesia of skin: Secondary | ICD-10-CM | POA: Insufficient documentation

## 2021-12-04 DIAGNOSIS — E1169 Type 2 diabetes mellitus with other specified complication: Secondary | ICD-10-CM | POA: Insufficient documentation

## 2021-12-04 DIAGNOSIS — L4050 Arthropathic psoriasis, unspecified: Secondary | ICD-10-CM

## 2021-12-04 DIAGNOSIS — M05742 Rheumatoid arthritis with rheumatoid factor of left hand without organ or systems involvement: Secondary | ICD-10-CM

## 2021-12-04 LAB — POCT GLYCOSYLATED HEMOGLOBIN (HGB A1C): Hemoglobin A1C: 6.3 % — AB (ref 4.0–5.6)

## 2021-12-04 MED ORDER — GABAPENTIN 100 MG PO CAPS: 100.0000 mg | ORAL_CAPSULE | Freq: Every day | ORAL | 0 refills | Status: DC

## 2021-12-04 MED ORDER — IRBESARTAN-HYDROCHLOROTHIAZIDE 150-12.5 MG PO TABS: 1.0000 | ORAL_TABLET | ORAL | 0 refills | Status: DC

## 2021-12-04 MED ORDER — DOXYCYCLINE HYCLATE 100 MG PO TABS: 100.0000 mg | ORAL_TABLET | Freq: Two times a day (BID) | ORAL | 0 refills | Status: DC
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
## 2021-12-05 ENCOUNTER — Other Ambulatory Visit: Payer: Self-pay | Admitting: Family Medicine

## 2021-12-05 DIAGNOSIS — E890 Postprocedural hypothyroidism: Secondary | ICD-10-CM

## 2021-12-05 LAB — LIPID PANEL
Cholesterol: 123 mg/dL (ref <200)
HDL: 27 mg/dL — ABNORMAL LOW (ref >=50)
LDL Cholesterol (Calc): 69 mg/dL (calc)
Non-HDL Cholesterol (Calc): 96 mg/dL (calc) (ref <130)
Total CHOL/HDL Ratio: 4.6 (calc) (ref <5.0)
Triglycerides: 203 mg/dL — ABNORMAL HIGH (ref <150)

## 2021-12-05 LAB — MICROALBUMIN / CREATININE URINE RATIO
Creatinine, Urine: 112 mg/dL (ref 20–275)
Microalb Creat Ratio: 12 mcg/mg creat (ref <30)
Microalb, Ur: 1.3 mg/dL

## 2021-12-05 LAB — B12 AND FOLATE PANEL
Folate: 23 ng/mL
Vitamin B-12: 1140 pg/mL — ABNORMAL HIGH (ref 200–1100)

## 2021-12-05 LAB — TSH: TSH: 0.43 mIU/L (ref 0.40–4.50)

## 2021-12-05 LAB — VITAMIN D 25 HYDROXY (VIT D DEFICIENCY, FRACTURES): Vit D, 25-Hydroxy: 42 ng/mL (ref 30–100)

## 2021-12-05 MED ORDER — LEVOTHYROXINE SODIUM 175 MCG PO TABS: 175.0000 ug | ORAL_TABLET | Freq: Every day | ORAL | 0 refills | Status: DC

## 2021-12-10 ENCOUNTER — Encounter: Payer: Self-pay | Admitting: Family Medicine

## 2021-12-11 MED FILL — Fosaprepitant Dimeglumine For IV Infusion 150 MG (Base Eq): INTRAVENOUS | Qty: 5 | Status: AC

## 2021-12-12 ENCOUNTER — Inpatient Hospital Stay: Payer: Medicare Other

## 2021-12-12 ENCOUNTER — Inpatient Hospital Stay (HOSPITAL_BASED_OUTPATIENT_CLINIC_OR_DEPARTMENT_OTHER): Payer: Medicare Other | Admitting: Oncology

## 2021-12-12 ENCOUNTER — Encounter: Payer: Self-pay | Admitting: Oncology

## 2021-12-12 ENCOUNTER — Other Ambulatory Visit: Payer: Self-pay

## 2021-12-12 VITALS — BP 126/77 | HR 109 | Temp 96.3°F | Ht 67.0 in | Wt 260.0 lb

## 2021-12-12 VITALS — BP 114/64 | HR 96 | Temp 98.0°F | Resp 17

## 2021-12-12 DIAGNOSIS — D6481 Anemia due to antineoplastic chemotherapy: Secondary | ICD-10-CM

## 2021-12-12 DIAGNOSIS — C55 Malignant neoplasm of uterus, part unspecified: Secondary | ICD-10-CM

## 2021-12-12 DIAGNOSIS — Z5111 Encounter for antineoplastic chemotherapy: Secondary | ICD-10-CM | POA: Diagnosis not present

## 2021-12-12 DIAGNOSIS — E876 Hypokalemia: Secondary | ICD-10-CM

## 2021-12-12 DIAGNOSIS — L4050 Arthropathic psoriasis, unspecified: Secondary | ICD-10-CM

## 2021-12-12 DIAGNOSIS — T451X5A Adverse effect of antineoplastic and immunosuppressive drugs, initial encounter: Secondary | ICD-10-CM

## 2021-12-12 LAB — COMPREHENSIVE METABOLIC PANEL WITH GFR
ALT: 20 U/L (ref 0–44)
AST: 26 U/L (ref 15–41)
Albumin: 3.3 g/dL — ABNORMAL LOW (ref 3.5–5.0)
Alkaline Phosphatase: 104 U/L (ref 38–126)
Anion gap: 10 (ref 5–15)
BUN: 28 mg/dL — ABNORMAL HIGH (ref 8–23)
CO2: 23 mmol/L (ref 22–32)
Calcium: 8.8 mg/dL — ABNORMAL LOW (ref 8.9–10.3)
Chloride: 103 mmol/L (ref 98–111)
Creatinine, Ser: 0.97 mg/dL (ref 0.44–1.00)
GFR, Estimated: >60 mL/min
Glucose, Bld: 128 mg/dL — ABNORMAL HIGH (ref 70–99)
Potassium: 3.1 mmol/L — ABNORMAL LOW (ref 3.5–5.1)
Sodium: 136 mmol/L (ref 135–145)
Total Bilirubin: 0.5 mg/dL (ref 0.3–1.2)
Total Protein: 7.2 g/dL (ref 6.5–8.1)

## 2021-12-12 LAB — CBC WITH DIFFERENTIAL/PLATELET
Abs Immature Granulocytes: 0.04 10*3/uL (ref 0.00–0.07)
Basophils Absolute: 0 10*3/uL (ref 0.0–0.1)
Basophils Relative: 0 %
Eosinophils Absolute: 0 10*3/uL (ref 0.0–0.5)
Eosinophils Relative: 1 %
HCT: 22.8 % — ABNORMAL LOW (ref 36.0–46.0)
Hemoglobin: 7.4 g/dL — ABNORMAL LOW (ref 12.0–15.0)
Immature Granulocytes: 1 %
Lymphocytes Relative: 27 %
Lymphs Abs: 1 10*3/uL (ref 0.7–4.0)
MCH: 30.7 pg (ref 26.0–34.0)
MCHC: 32.5 g/dL (ref 30.0–36.0)
MCV: 94.6 fL (ref 80.0–100.0)
Monocytes Absolute: 0.7 10*3/uL (ref 0.1–1.0)
Monocytes Relative: 17 %
Neutro Abs: 2.1 10*3/uL (ref 1.7–7.7)
Neutrophils Relative %: 54 %
Platelets: 121 10*3/uL — ABNORMAL LOW (ref 150–400)
RBC: 2.41 MIL/uL — ABNORMAL LOW (ref 3.87–5.11)
RDW: 21 % — ABNORMAL HIGH (ref 11.5–15.5)
WBC: 3.8 10*3/uL — ABNORMAL LOW (ref 4.0–10.5)
nRBC: 0.5 % — ABNORMAL HIGH (ref 0.0–0.2)

## 2021-12-12 LAB — PREPARE RBC (CROSSMATCH)

## 2021-12-12 MED ORDER — DIPHENHYDRAMINE HCL 25 MG PO CAPS
25.0000 mg | ORAL_CAPSULE | Freq: Once | ORAL | Status: AC
  Administered 2021-12-12: 25 mg via ORAL
  Filled 2021-12-12: qty 1

## 2021-12-12 MED ORDER — SODIUM CHLORIDE 0.9 % IV SOLN
40.0000 meq | Freq: Once | INTRAVENOUS | Status: AC
  Administered 2021-12-12: 40 meq via INTRAVENOUS
  Filled 2021-12-12: qty 20

## 2021-12-12 MED ORDER — ACETAMINOPHEN 325 MG PO TABS
650.0000 mg | ORAL_TABLET | Freq: Once | ORAL | Status: AC
  Administered 2021-12-12: 650 mg via ORAL
  Filled 2021-12-12: qty 2

## 2021-12-12 MED ORDER — POTASSIUM CHLORIDE CRYS ER 20 MEQ PO TBCR: 20.0000 meq | EXTENDED_RELEASE_TABLET | Freq: Every day | ORAL | 0 refills | Status: DC

## 2021-12-12 MED ORDER — SODIUM CHLORIDE 0.9% IV SOLUTION
250.0000 mL | Freq: Once | INTRAVENOUS | Status: AC
  Administered 2021-12-12: 250 mL via INTRAVENOUS
  Filled 2021-12-12: qty 250

## 2021-12-12 MED FILL — Dexamethasone Sodium Phosphate Inj 100 MG/10ML: INTRAMUSCULAR | Qty: 1 | Status: AC

## 2021-12-12 NOTE — Assessment & Plan Note (Signed)
IV potassium chlorid 19mq today.  Recommend patient to take oral  potassium chloride 243m daily. Rx sent.

## 2021-12-12 NOTE — Progress Notes (Signed)
Patient Hgb 7.4 today, K+ and 1 unit RBC infusion complete, no questions/concerns voiced. Patient stable at discharge. AVS given.
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
## 2021-12-12 NOTE — Assessment & Plan Note (Addendum)
#  Carcinosarcoma of uterus, stage IIIc, left SLN macrometastasis and positive vaginal margin.   Case was discussed with gynecology oncology.-Recommend adjuvant chemotherapy, carboplatin/Taxol for 6 cycles followed by external radiation and VBT. Labs reviewed and discussed once with patient. Hold chemotherapy today due to symptomatic anemia.  Patient will repeat blood work nex week and proceed with cycle 5 carboplatin/Taxol.- decrease to AUC 5. She will get 1 unit of PRBC transfusion today.

## 2021-12-12 NOTE — Assessment & Plan Note (Signed)
Symptomatic anemia. Hb 7s Recommend 1 unit of PRBC transfusion today.  Monitor cbc 7-10 days after next cycle.

## 2021-12-12 NOTE — Assessment & Plan Note (Signed)
Chemotherapy plan as listed above 

## 2021-12-12 NOTE — Assessment & Plan Note (Signed)
Rheumatoid arthritis/Psoriatic arthritis  previously on Humira.  Currently off treatment due to being on chemotherapy.

## 2021-12-12 NOTE — Patient Instructions (Signed)
Blood Transfusion, Adult, Care After ?This sheet gives you information about how to care for yourself after your procedure. Your health care provider may also give you more specific instructions. If you have problems or questions, contact your health care provider. ?What can I expect after the procedure? ?After the procedure, it is common to have: ?Bruising and soreness where the IV was inserted. ?A headache. ?Follow these instructions at home: ?IV insertion site care ? ?  ? ?Follow instructions from your health care provider about how to take care of your IV insertion site. Make sure you: ?Wash your hands with soap and water before and after you change your bandage (dressing). If soap and water are not available, use hand sanitizer. ?Change your dressing as told by your health care provider. ?Check your IV insertion site every day for signs of infection. Check for: ?Redness, swelling, or pain. ?Bleeding from the site. ?Warmth. ?Pus or a bad smell. ?General instructions ?Take over-the-counter and prescription medicines only as told by your health care provider. ?Rest as told by your health care provider. ?Return to your normal activities as told by your health care provider. ?Keep all follow-up visits as told by your health care provider. This is important. ?Contact a health care provider if: ?You have itching or red, swollen areas of skin (hives). ?You feel anxious. ?You feel weak after doing your normal activities. ?You have redness, swelling, warmth, or pain around the IV insertion site. ?You have blood coming from the IV insertion site that does not stop with pressure. ?You have pus or a bad smell coming from your IV insertion site. ?Get help right away if: ?You have symptoms of a serious allergic or immune system reaction, including: ?Trouble breathing or shortness of breath. ?Swelling of the face or feeling flushed. ?Fever or chills. ?Pain in the head, back, or chest. ?Dark urine or blood in the  urine. ?Widespread rash. ?Fast heartbeat. ?Feeling dizzy or light-headed. ?If you receive your blood transfusion in an outpatient setting, you will be told whom to contact to report any reactions. ?These symptoms may represent a serious problem that is an emergency. Do not wait to see if the symptoms will go away. Get medical help right away. Call your local emergency services (911 in the [place] Do not drive yourself to the hospital. ?Summary ?Bruising and tenderness around the IV insertion site are common. ?Check your IV insertion site every day for signs of infection. ?Rest as told by your health care provider. Return to your normal activities as told by your health care provider. ?Get help right away for symptoms of a serious allergic or immune system reaction to blood transfusion. ?This information is not intended to replace advice given to you by your health care provider. Make sure you discuss any questions you have with your health care provider. ?Document Revised: 10/05/2020 Document Reviewed: 12/03/2018 ?Elsevier Patient Education ? 2023 [employer] ? ?

## 2021-12-13 LAB — BPAM RBC
Blood Product Expiration Date: 202307102359
ISSUE DATE / TIME: 202306211313
Unit Type and Rh: 9500

## 2021-12-13 LAB — TYPE AND SCREEN
ABO/RH(D): O POS
Antibody Screen: POSITIVE
Unit division: 0

## 2021-12-14 ENCOUNTER — Inpatient Hospital Stay: Payer: Medicare Other

## 2021-12-17 ENCOUNTER — Inpatient Hospital Stay: Payer: Medicare Other

## 2021-12-17 ENCOUNTER — Other Ambulatory Visit: Payer: Self-pay | Admitting: Oncology

## 2021-12-17 ENCOUNTER — Encounter: Payer: Self-pay | Admitting: Oncology

## 2021-12-17 VITALS — BP 141/79 | HR 88 | Temp 97.4°F | Resp 18 | Wt 259.1 lb

## 2021-12-17 DIAGNOSIS — C55 Malignant neoplasm of uterus, part unspecified: Secondary | ICD-10-CM

## 2021-12-17 DIAGNOSIS — Z5111 Encounter for antineoplastic chemotherapy: Secondary | ICD-10-CM | POA: Diagnosis not present

## 2021-12-17 LAB — COMPREHENSIVE METABOLIC PANEL
ALT: 27 U/L (ref 0–44)
AST: 38 U/L (ref 15–41)
Albumin: 3.4 g/dL — ABNORMAL LOW (ref 3.5–5.0)
Alkaline Phosphatase: 97 U/L (ref 38–126)
Anion gap: 7 (ref 5–15)
BUN: 13 mg/dL (ref 8–23)
CO2: 25 mmol/L (ref 22–32)
Calcium: 9 mg/dL (ref 8.9–10.3)
Chloride: 106 mmol/L (ref 98–111)
Creatinine, Ser: 0.95 mg/dL (ref 0.44–1.00)
GFR, Estimated: >60 mL/min (ref >60)
Glucose, Bld: 138 mg/dL — ABNORMAL HIGH (ref 70–99)
Potassium: 3.7 mmol/L (ref 3.5–5.1)
Sodium: 138 mmol/L (ref 135–145)
Total Bilirubin: 0.6 mg/dL (ref 0.3–1.2)
Total Protein: 7.3 g/dL (ref 6.5–8.1)

## 2021-12-17 LAB — CBC WITH DIFFERENTIAL/PLATELET
Abs Immature Granulocytes: 0.01 10*3/uL (ref 0.00–0.07)
Basophils Absolute: 0 10*3/uL (ref 0.0–0.1)
Basophils Relative: 0 %
Eosinophils Absolute: 0 10*3/uL (ref 0.0–0.5)
Eosinophils Relative: 1 %
HCT: 27.8 % — ABNORMAL LOW (ref 36.0–46.0)
Hemoglobin: 9.1 g/dL — ABNORMAL LOW (ref 12.0–15.0)
Immature Granulocytes: 0 %
Lymphocytes Relative: 29 %
Lymphs Abs: 1.1 10*3/uL (ref 0.7–4.0)
MCH: 30.7 pg (ref 26.0–34.0)
MCHC: 32.7 g/dL (ref 30.0–36.0)
MCV: 93.9 fL (ref 80.0–100.0)
Monocytes Absolute: 0.7 10*3/uL (ref 0.1–1.0)
Monocytes Relative: 19 %
Neutro Abs: 1.9 10*3/uL (ref 1.7–7.7)
Neutrophils Relative %: 51 %
Platelets: 173 10*3/uL (ref 150–400)
RBC: 2.96 MIL/uL — ABNORMAL LOW (ref 3.87–5.11)
RDW: 20.1 % — ABNORMAL HIGH (ref 11.5–15.5)
WBC: 3.8 10*3/uL — ABNORMAL LOW (ref 4.0–10.5)
nRBC: 0 % (ref 0.0–0.2)

## 2021-12-17 MED ORDER — SODIUM CHLORIDE 0.9 % IV SOLN
135.0000 mg/m2 | Freq: Once | INTRAVENOUS | Status: AC
  Administered 2021-12-17: 324 mg via INTRAVENOUS
  Filled 2021-12-17: qty 54

## 2021-12-17 MED ORDER — DIPHENHYDRAMINE HCL 50 MG/ML IJ SOLN
50.0000 mg | Freq: Once | INTRAMUSCULAR | Status: AC
  Administered 2021-12-17: 50 mg via INTRAVENOUS
  Filled 2021-12-17: qty 1

## 2021-12-17 MED ORDER — FAMOTIDINE IN NACL 20-0.9 MG/50ML-% IV SOLN
20.0000 mg | Freq: Once | INTRAVENOUS | Status: AC
  Administered 2021-12-17: 20 mg via INTRAVENOUS
  Filled 2021-12-17: qty 50

## 2021-12-17 MED ORDER — SODIUM CHLORIDE 0.9 % IV SOLN
150.0000 mg | Freq: Once | INTRAVENOUS | Status: AC
  Administered 2021-12-17: 150 mg via INTRAVENOUS
  Filled 2021-12-17: qty 150

## 2021-12-17 MED ORDER — SODIUM CHLORIDE 0.9 % IV SOLN
10.0000 mg | Freq: Once | INTRAVENOUS | Status: AC
  Administered 2021-12-17: 10 mg via INTRAVENOUS
  Filled 2021-12-17: qty 10

## 2021-12-17 MED ORDER — SODIUM CHLORIDE 0.9 % IV SOLN
Freq: Once | INTRAVENOUS | Status: AC
  Filled 2021-12-17: qty 250

## 2021-12-17 MED ORDER — SODIUM CHLORIDE 0.9 % IV SOLN
642.0000 mg | Freq: Once | INTRAVENOUS | Status: AC
  Administered 2021-12-17: 640 mg via INTRAVENOUS
  Filled 2021-12-17: qty 64

## 2021-12-17 MED ORDER — PALONOSETRON HCL INJECTION 0.25 MG/5ML
0.2500 mg | Freq: Once | INTRAVENOUS | Status: AC
  Administered 2021-12-17: 0.25 mg via INTRAVENOUS
  Filled 2021-12-17: qty 5

## 2021-12-19 ENCOUNTER — Inpatient Hospital Stay: Payer: Medicare Other

## 2021-12-19 DIAGNOSIS — C55 Malignant neoplasm of uterus, part unspecified: Secondary | ICD-10-CM

## 2021-12-19 DIAGNOSIS — Z5111 Encounter for antineoplastic chemotherapy: Secondary | ICD-10-CM | POA: Diagnosis not present

## 2021-12-19 MED ORDER — PEGFILGRASTIM INJECTION 6 MG/0.6ML ~~LOC~~
6.0000 mg | PREFILLED_SYRINGE | Freq: Once | SUBCUTANEOUS | Status: AC
  Administered 2021-12-19: 6 mg via SUBCUTANEOUS
  Filled 2021-12-19: qty 0.6

## 2021-12-26 ENCOUNTER — Inpatient Hospital Stay: Payer: Medicare Other | Attending: Obstetrics and Gynecology

## 2021-12-26 DIAGNOSIS — E785 Hyperlipidemia, unspecified: Secondary | ICD-10-CM | POA: Insufficient documentation

## 2021-12-26 DIAGNOSIS — E876 Hypokalemia: Secondary | ICD-10-CM | POA: Diagnosis not present

## 2021-12-26 DIAGNOSIS — E119 Type 2 diabetes mellitus without complications: Secondary | ICD-10-CM | POA: Insufficient documentation

## 2021-12-26 DIAGNOSIS — T451X5A Adverse effect of antineoplastic and immunosuppressive drugs, initial encounter: Secondary | ICD-10-CM | POA: Insufficient documentation

## 2021-12-26 DIAGNOSIS — D6481 Anemia due to antineoplastic chemotherapy: Secondary | ICD-10-CM | POA: Diagnosis not present

## 2021-12-26 DIAGNOSIS — Z5189 Encounter for other specified aftercare: Secondary | ICD-10-CM | POA: Insufficient documentation

## 2021-12-26 DIAGNOSIS — Z79899 Other long term (current) drug therapy: Secondary | ICD-10-CM | POA: Diagnosis not present

## 2021-12-26 DIAGNOSIS — T63441A Toxic effect of venom of bees, accidental (unintentional), initial encounter: Secondary | ICD-10-CM | POA: Insufficient documentation

## 2021-12-26 DIAGNOSIS — K5730 Diverticulosis of large intestine without perforation or abscess without bleeding: Secondary | ICD-10-CM | POA: Insufficient documentation

## 2021-12-26 DIAGNOSIS — M069 Rheumatoid arthritis, unspecified: Secondary | ICD-10-CM | POA: Insufficient documentation

## 2021-12-26 DIAGNOSIS — Y929 Unspecified place or not applicable: Secondary | ICD-10-CM | POA: Insufficient documentation

## 2021-12-26 DIAGNOSIS — Z79890 Hormone replacement therapy (postmenopausal): Secondary | ICD-10-CM | POA: Insufficient documentation

## 2021-12-26 DIAGNOSIS — C55 Malignant neoplasm of uterus, part unspecified: Secondary | ICD-10-CM | POA: Diagnosis present

## 2021-12-26 DIAGNOSIS — L4050 Arthropathic psoriasis, unspecified: Secondary | ICD-10-CM | POA: Insufficient documentation

## 2021-12-26 DIAGNOSIS — E039 Hypothyroidism, unspecified: Secondary | ICD-10-CM | POA: Diagnosis not present

## 2021-12-26 DIAGNOSIS — K449 Diaphragmatic hernia without obstruction or gangrene: Secondary | ICD-10-CM | POA: Insufficient documentation

## 2021-12-26 DIAGNOSIS — Z5111 Encounter for antineoplastic chemotherapy: Secondary | ICD-10-CM | POA: Insufficient documentation

## 2021-12-26 DIAGNOSIS — I10 Essential (primary) hypertension: Secondary | ICD-10-CM | POA: Insufficient documentation

## 2021-12-26 LAB — COMPREHENSIVE METABOLIC PANEL
ALT: 34 U/L (ref 0–44)
AST: 34 U/L (ref 15–41)
Albumin: 3.4 g/dL — ABNORMAL LOW (ref 3.5–5.0)
Alkaline Phosphatase: 94 U/L (ref 38–126)
Anion gap: 10 (ref 5–15)
BUN: 21 mg/dL (ref 8–23)
CO2: 24 mmol/L (ref 22–32)
Calcium: 8.6 mg/dL — ABNORMAL LOW (ref 8.9–10.3)
Chloride: 100 mmol/L (ref 98–111)
Creatinine, Ser: 1 mg/dL (ref 0.44–1.00)
GFR, Estimated: >60 mL/min (ref >60)
Glucose, Bld: 114 mg/dL — ABNORMAL HIGH (ref 70–99)
Potassium: 3.6 mmol/L (ref 3.5–5.1)
Sodium: 134 mmol/L — ABNORMAL LOW (ref 135–145)
Total Bilirubin: 0.6 mg/dL (ref 0.3–1.2)
Total Protein: 7.2 g/dL (ref 6.5–8.1)

## 2021-12-26 LAB — CBC WITH DIFFERENTIAL/PLATELET
Abs Immature Granulocytes: 0.09 10*3/uL — ABNORMAL HIGH (ref 0.00–0.07)
Basophils Absolute: 0 10*3/uL (ref 0.0–0.1)
Basophils Relative: 1 %
Eosinophils Absolute: 0 10*3/uL (ref 0.0–0.5)
Eosinophils Relative: 0 %
HCT: 24.3 % — ABNORMAL LOW (ref 36.0–46.0)
Hemoglobin: 8.1 g/dL — ABNORMAL LOW (ref 12.0–15.0)
Immature Granulocytes: 2 %
Lymphocytes Relative: 22 %
Lymphs Abs: 1.1 10*3/uL (ref 0.7–4.0)
MCH: 31.8 pg (ref 26.0–34.0)
MCHC: 33.3 g/dL (ref 30.0–36.0)
MCV: 95.3 fL (ref 80.0–100.0)
Monocytes Absolute: 1.3 10*3/uL — ABNORMAL HIGH (ref 0.1–1.0)
Monocytes Relative: 26 %
Neutro Abs: 2.5 10*3/uL (ref 1.7–7.7)
Neutrophils Relative %: 49 %
Platelets: 92 10*3/uL — ABNORMAL LOW (ref 150–400)
RBC: 2.55 MIL/uL — ABNORMAL LOW (ref 3.87–5.11)
RDW: 18.1 % — ABNORMAL HIGH (ref 11.5–15.5)
Smear Review: NORMAL
WBC: 5.1 10*3/uL (ref 4.0–10.5)
nRBC: 0 % (ref 0.0–0.2)

## 2022-01-02 ENCOUNTER — Encounter: Payer: Self-pay | Admitting: Radiation Oncology

## 2022-01-02 ENCOUNTER — Ambulatory Visit
Admission: RE | Admit: 2022-01-02 | Discharge: 2022-01-02 | Disposition: A | Discharge status: Home / Self Care | Payer: Medicare Other | Source: Ambulatory Visit | Attending: Radiation Oncology | Admitting: Radiation Oncology

## 2022-01-02 VITALS — BP 115/69 | HR 107 | Temp 97.6°F | Resp 16 | Ht 67.0 in | Wt 257.8 lb

## 2022-01-02 DIAGNOSIS — C549 Malignant neoplasm of corpus uteri, unspecified: Secondary | ICD-10-CM
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
## 2022-01-03 ENCOUNTER — Encounter: Payer: Self-pay | Admitting: Oncology

## 2022-01-08 ENCOUNTER — Other Ambulatory Visit: Payer: Self-pay | Admitting: Family Medicine

## 2022-01-08 DIAGNOSIS — N6001 Solitary cyst of right breast: Secondary | ICD-10-CM

## 2022-01-08 DIAGNOSIS — N6489 Other specified disorders of breast: Secondary | ICD-10-CM

## 2022-01-08 MED FILL — Dexamethasone Sodium Phosphate Inj 100 MG/10ML: INTRAMUSCULAR | Qty: 1 | Status: AC

## 2022-01-09 ENCOUNTER — Encounter: Payer: Self-pay | Admitting: Oncology

## 2022-01-09 ENCOUNTER — Inpatient Hospital Stay: Payer: Medicare Other

## 2022-01-09 ENCOUNTER — Other Ambulatory Visit: Payer: Self-pay | Admitting: Oncology

## 2022-01-09 ENCOUNTER — Ambulatory Visit: Payer: Medicare Other

## 2022-01-09 ENCOUNTER — Other Ambulatory Visit: Payer: Self-pay

## 2022-01-09 ENCOUNTER — Inpatient Hospital Stay (HOSPITAL_BASED_OUTPATIENT_CLINIC_OR_DEPARTMENT_OTHER): Payer: Medicare Other | Admitting: Oncology

## 2022-01-09 VITALS — BP 113/56 | HR 101 | Temp 96.7°F | Resp 18 | Wt 259.1 lb

## 2022-01-09 DIAGNOSIS — Z5111 Encounter for antineoplastic chemotherapy: Secondary | ICD-10-CM | POA: Diagnosis not present

## 2022-01-09 DIAGNOSIS — D6481 Anemia due to antineoplastic chemotherapy: Secondary | ICD-10-CM

## 2022-01-09 DIAGNOSIS — C55 Malignant neoplasm of uterus, part unspecified: Secondary | ICD-10-CM

## 2022-01-09 DIAGNOSIS — E876 Hypokalemia: Secondary | ICD-10-CM | POA: Diagnosis not present

## 2022-01-09 DIAGNOSIS — T451X5A Adverse effect of antineoplastic and immunosuppressive drugs, initial encounter: Secondary | ICD-10-CM

## 2022-01-09 LAB — CBC WITH DIFFERENTIAL/PLATELET
Abs Immature Granulocytes: 0.02 10*3/uL (ref 0.00–0.07)
Basophils Absolute: 0 10*3/uL (ref 0.0–0.1)
Basophils Relative: 0 %
Eosinophils Absolute: 0.1 10*3/uL (ref 0.0–0.5)
Eosinophils Relative: 1 %
HCT: 24.6 % — ABNORMAL LOW (ref 36.0–46.0)
Hemoglobin: 7.9 g/dL — ABNORMAL LOW (ref 12.0–15.0)
Immature Granulocytes: 1 %
Lymphocytes Relative: 22 %
Lymphs Abs: 0.9 10*3/uL (ref 0.7–4.0)
MCH: 32.4 pg (ref 26.0–34.0)
MCHC: 32.1 g/dL (ref 30.0–36.0)
MCV: 100.8 fL — ABNORMAL HIGH (ref 80.0–100.0)
Monocytes Absolute: 0.8 10*3/uL (ref 0.1–1.0)
Monocytes Relative: 19 %
Neutro Abs: 2.3 10*3/uL (ref 1.7–7.7)
Neutrophils Relative %: 57 %
Platelets: 112 10*3/uL — ABNORMAL LOW (ref 150–400)
RBC: 2.44 MIL/uL — ABNORMAL LOW (ref 3.87–5.11)
RDW: 19.3 % — ABNORMAL HIGH (ref 11.5–15.5)
WBC: 4.1 10*3/uL (ref 4.0–10.5)
nRBC: 0 % (ref 0.0–0.2)

## 2022-01-09 LAB — COMPREHENSIVE METABOLIC PANEL
ALT: 23 U/L (ref 0–44)
AST: 29 U/L (ref 15–41)
Albumin: 3.5 g/dL (ref 3.5–5.0)
Alkaline Phosphatase: 91 U/L (ref 38–126)
Anion gap: 7 (ref 5–15)
BUN: 16 mg/dL (ref 8–23)
CO2: 26 mmol/L (ref 22–32)
Calcium: 9.1 mg/dL (ref 8.9–10.3)
Chloride: 106 mmol/L (ref 98–111)
Creatinine, Ser: 1.01 mg/dL — ABNORMAL HIGH (ref 0.44–1.00)
GFR, Estimated: >60 mL/min (ref >60)
Glucose, Bld: 138 mg/dL — ABNORMAL HIGH (ref 70–99)
Potassium: 3.9 mmol/L (ref 3.5–5.1)
Sodium: 139 mmol/L (ref 135–145)
Total Bilirubin: 0.7 mg/dL (ref 0.3–1.2)
Total Protein: 7.3 g/dL (ref 6.5–8.1)

## 2022-01-09 LAB — PREPARE RBC (CROSSMATCH)

## 2022-01-09 MED ORDER — SODIUM CHLORIDE 0.9 % IV SOLN
10.0000 mg | Freq: Once | INTRAVENOUS | Status: AC
  Administered 2022-01-09: 10 mg via INTRAVENOUS
  Filled 2022-01-09: qty 10

## 2022-01-09 MED ORDER — SODIUM CHLORIDE 0.9 % IV SOLN
150.0000 mg | Freq: Once | INTRAVENOUS | Status: AC
  Administered 2022-01-09: 150 mg via INTRAVENOUS
  Filled 2022-01-09: qty 5

## 2022-01-09 MED ORDER — HEPARIN SOD (PORK) LOCK FLUSH 100 UNIT/ML IV SOLN
500.0000 [IU] | Freq: Once | INTRAVENOUS | Status: DC | PRN
  Filled 2022-01-09: qty 5

## 2022-01-09 MED ORDER — SODIUM CHLORIDE 0.9 % IV SOLN
135.0000 mg/m2 | Freq: Once | INTRAVENOUS | Status: AC
  Administered 2022-01-09: 324 mg via INTRAVENOUS
  Filled 2022-01-09: qty 54

## 2022-01-09 MED ORDER — FAMOTIDINE IN NACL 20-0.9 MG/50ML-% IV SOLN
20.0000 mg | Freq: Once | INTRAVENOUS | Status: AC
  Administered 2022-01-09: 20 mg via INTRAVENOUS
  Filled 2022-01-09: qty 50

## 2022-01-09 MED ORDER — SODIUM CHLORIDE 0.9 % IV SOLN
Freq: Once | INTRAVENOUS | Status: AC
  Filled 2022-01-09: qty 250

## 2022-01-09 MED ORDER — SODIUM CHLORIDE 0.9 % IV SOLN
637.0000 mg | Freq: Once | INTRAVENOUS | Status: AC
  Administered 2022-01-09: 640 mg via INTRAVENOUS
  Filled 2022-01-09: qty 64

## 2022-01-09 MED ORDER — DIPHENHYDRAMINE HCL 50 MG/ML IJ SOLN
50.0000 mg | Freq: Once | INTRAMUSCULAR | Status: AC
  Administered 2022-01-09: 50 mg via INTRAVENOUS
  Filled 2022-01-09: qty 1

## 2022-01-09 MED ORDER — PALONOSETRON HCL INJECTION 0.25 MG/5ML
0.2500 mg | Freq: Once | INTRAVENOUS | Status: AC
  Administered 2022-01-09: 0.25 mg via INTRAVENOUS
  Filled 2022-01-09: qty 5

## 2022-01-09 NOTE — Assessment & Plan Note (Signed)
Chemotherapy plan as listed above 

## 2022-01-09 NOTE — Progress Notes (Signed)
Pt here for follow up. Pt reports she was stung by a yellow jacket yesterday on right hand finger. No further concerns voiced.

## 2022-01-09 NOTE — Assessment & Plan Note (Addendum)
Symptomatic anemia  recommend 1 unit of PRBC transfusion this week. Repeat cbc in 10 days.

## 2022-01-09 NOTE — Assessment & Plan Note (Signed)
#  Carcinosarcoma of uterus, stage IIIc, left SLN macrometastasis and positive vaginal margin.   Case was discussed with gynecology oncology.-Recommend adjuvant chemotherapy, carboplatin/Taxol for 6 cycles followed by external radiation and VBT. Labs reviewed and discussed once with patient. proceed with cycle 6 carboplatin/Taxol.- decrease to AUC 5. She will get 1 unit of PRBC transfusion this week.

## 2022-01-09 NOTE — Assessment & Plan Note (Signed)
continue oral  potassium chloride 36mq daily, finish current supply.

## 2022-01-09 NOTE — Patient Instructions (Signed)
[facility]  Discharge Instructions: Thank you for choosing [facility] to provide your oncology and hematology care.  If you have a lab appointment with the Cancer Center, please go directly to the Cancer Center and check in at the registration area.  Wear comfortable clothing and clothing appropriate for easy access to any Portacath or PICC line.   We strive to give you quality time with your provider. You may need to reschedule your appointment if you arrive late (15 or more minutes).  Arriving late affects you and other patients whose appointments are after yours.  Also, if you miss three or more appointments without notifying the office, you may be dismissed from the clinic at the provider's discretion.      For prescription refill requests, have your pharmacy contact our office and allow 72 hours for refills to be completed.    Today you received the following chemotherapy and/or immunotherapy agents: Taxol, Carboplatin      To help prevent nausea and vomiting after your treatment, we encourage you to take your nausea medication as directed.  BELOW ARE SYMPTOMS THAT SHOULD BE REPORTED IMMEDIATELY: *FEVER GREATER THAN 100.4 F (38 C) OR HIGHER *CHILLS OR SWEATING *NAUSEA AND VOMITING THAT IS NOT CONTROLLED WITH YOUR NAUSEA MEDICATION *UNUSUAL SHORTNESS OF BREATH *UNUSUAL BRUISING OR BLEEDING *URINARY PROBLEMS (pain or burning when urinating, or frequent urination) *BOWEL PROBLEMS (unusual diarrhea, constipation, pain near the anus) TENDERNESS IN MOUTH AND THROAT WITH OR WITHOUT PRESENCE OF ULCERS (sore throat, sores in mouth, or a toothache) UNUSUAL RASH, SWELLING OR PAIN  UNUSUAL VAGINAL DISCHARGE OR ITCHING   Items with * indicate a potential emergency and should be followed up as soon as possible or go to the Emergency Department if any problems should occur.  Please show the CHEMOTHERAPY ALERT CARD or IMMUNOTHERAPY ALERT CARD at  check-in to the Emergency Department and triage nurse.  Should you have questions after your visit or need to cancel or reschedule your appointment, please contact [facility]  [phone] and follow the prompts.  Office hours are 8:00 a.m. to 4:30 p.m. Monday - Friday. Please note that voicemails left after 4:00 p.m. may not be returned until the following business day.  We are closed weekends and major holidays. You have access to a nurse at all times for urgent questions. Please call the main number to the clinic [phone] and follow the prompts.  For any non-urgent questions, you may also contact your provider using MyChart. We now offer e-Visits for anyone 18 and older to request care online for non-urgent symptoms. For details visit [email or web address].   Also download the MyChart app! Go to the app store, search "MyChart", open the app, select [facility], and log in with your MyChart username and password.  Masks are optional in the cancer centers. If you would like for your care team to wear a mask while they are taking care of you, please let them know. For doctor visits, patients may have with them one support person who is at least 69 years old. At this time, visitors are not allowed in the infusion area.   
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
## 2022-01-10 ENCOUNTER — Ambulatory Visit: Payer: Medicare Other

## 2022-01-11 ENCOUNTER — Inpatient Hospital Stay: Payer: Medicare Other

## 2022-01-11 ENCOUNTER — Other Ambulatory Visit: Payer: Self-pay | Admitting: Oncology

## 2022-01-11 DIAGNOSIS — C55 Malignant neoplasm of uterus, part unspecified: Secondary | ICD-10-CM

## 2022-01-11 MED ORDER — ACETAMINOPHEN 325 MG PO TABS
650.0000 mg | ORAL_TABLET | Freq: Once | ORAL | Status: AC
  Administered 2022-01-11: 650 mg via ORAL
  Filled 2022-01-11: qty 2

## 2022-01-11 MED ORDER — SODIUM CHLORIDE 0.9% FLUSH
3.0000 mL | INTRAVENOUS | Status: DC | PRN
  Filled 2022-01-11: qty 3

## 2022-01-11 MED ORDER — DIPHENHYDRAMINE HCL 25 MG PO CAPS
25.0000 mg | ORAL_CAPSULE | Freq: Once | ORAL | Status: AC
  Administered 2022-01-11: 25 mg via ORAL
  Filled 2022-01-11: qty 1

## 2022-01-11 MED ORDER — PEGFILGRASTIM INJECTION 6 MG/0.6ML ~~LOC~~
6.0000 mg | PREFILLED_SYRINGE | Freq: Once | SUBCUTANEOUS | Status: AC
  Administered 2022-01-11: 6 mg via SUBCUTANEOUS
  Filled 2022-01-11: qty 0.6

## 2022-01-11 MED ORDER — SODIUM CHLORIDE 0.9% IV SOLUTION
250.0000 mL | Freq: Once | INTRAVENOUS | Status: AC
  Administered 2022-01-11: 250 mL via INTRAVENOUS
  Filled 2022-01-11: qty 250

## 2022-01-12 LAB — TYPE AND SCREEN
ABO/RH(D): O POS
Antibody Screen: POSITIVE
Unit division: 0

## 2022-01-12 LAB — BPAM RBC
Blood Product Expiration Date: 202308202359
ISSUE DATE / TIME: 202307210951
Unit Type and Rh: 9500

## 2022-01-14 ENCOUNTER — Other Ambulatory Visit: Payer: Self-pay

## 2022-01-18 ENCOUNTER — Inpatient Hospital Stay: Payer: Medicare Other

## 2022-01-18 DIAGNOSIS — C55 Malignant neoplasm of uterus, part unspecified: Secondary | ICD-10-CM | POA: Diagnosis not present

## 2022-01-18 LAB — CBC WITH DIFFERENTIAL/PLATELET
Abs Immature Granulocytes: 0.09 10*3/uL — ABNORMAL HIGH (ref 0.00–0.07)
Basophils Absolute: 0.1 10*3/uL (ref 0.0–0.1)
Basophils Relative: 2 %
Eosinophils Absolute: 0 10*3/uL (ref 0.0–0.5)
Eosinophils Relative: 1 %
HCT: 25.3 % — ABNORMAL LOW (ref 36.0–46.0)
Hemoglobin: 8.4 g/dL — ABNORMAL LOW (ref 12.0–15.0)
Immature Granulocytes: 2 %
Lymphocytes Relative: 16 %
Lymphs Abs: 0.8 10*3/uL (ref 0.7–4.0)
MCH: 31.9 pg (ref 26.0–34.0)
MCHC: 33.2 g/dL (ref 30.0–36.0)
MCV: 96.2 fL (ref 80.0–100.0)
Monocytes Absolute: 1.1 10*3/uL — ABNORMAL HIGH (ref 0.1–1.0)
Monocytes Relative: 22 %
Neutro Abs: 2.8 10*3/uL (ref 1.7–7.7)
Neutrophils Relative %: 57 %
Platelets: 45 10*3/uL — ABNORMAL LOW (ref 150–400)
RBC: 2.63 MIL/uL — ABNORMAL LOW (ref 3.87–5.11)
RDW: 17 % — ABNORMAL HIGH (ref 11.5–15.5)
Smear Review: NORMAL
WBC: 4.8 10*3/uL (ref 4.0–10.5)
nRBC: 0.4 % — ABNORMAL HIGH (ref 0.0–0.2)

## 2022-01-18 LAB — SAMPLE TO BLOOD BANK

## 2022-01-22 ENCOUNTER — Other Ambulatory Visit: Payer: Self-pay

## 2022-01-22 ENCOUNTER — Other Ambulatory Visit: Payer: Self-pay | Admitting: Family Medicine

## 2022-01-22 DIAGNOSIS — E1169 Type 2 diabetes mellitus with other specified complication: Secondary | ICD-10-CM

## 2022-01-23 ENCOUNTER — Ambulatory Visit
Admission: RE | Admit: 2022-01-23 | Discharge: 2022-01-23 | Disposition: A | Discharge status: Home / Self Care | Payer: Medicare Other | Source: Ambulatory Visit | Attending: Radiation Oncology | Admitting: Radiation Oncology

## 2022-01-23 DIAGNOSIS — L4050 Arthropathic psoriasis, unspecified: Secondary | ICD-10-CM | POA: Diagnosis not present

## 2022-01-23 DIAGNOSIS — Z9221 Personal history of antineoplastic chemotherapy: Secondary | ICD-10-CM | POA: Diagnosis not present

## 2022-01-23 DIAGNOSIS — Z923 Personal history of irradiation: Secondary | ICD-10-CM | POA: Diagnosis not present

## 2022-01-23 DIAGNOSIS — M069 Rheumatoid arthritis, unspecified: Secondary | ICD-10-CM | POA: Diagnosis not present

## 2022-01-23 DIAGNOSIS — Z90722 Acquired absence of ovaries, bilateral: Secondary | ICD-10-CM | POA: Diagnosis not present

## 2022-01-23 DIAGNOSIS — Z90710 Acquired absence of both cervix and uterus: Secondary | ICD-10-CM | POA: Diagnosis not present

## 2022-01-23 DIAGNOSIS — C55 Malignant neoplasm of uterus, part unspecified: Secondary | ICD-10-CM | POA: Insufficient documentation

## 2022-01-23 DIAGNOSIS — Z6841 Body Mass Index (BMI) 40.0 and over, adult: Secondary | ICD-10-CM | POA: Diagnosis not present

## 2022-01-23 DIAGNOSIS — Z510 Encounter for antineoplastic radiation therapy: Secondary | ICD-10-CM | POA: Diagnosis present

## 2022-01-23 DIAGNOSIS — Z780 Asymptomatic menopausal state: Secondary | ICD-10-CM | POA: Diagnosis not present

## 2022-01-25 ENCOUNTER — Other Ambulatory Visit: Payer: Self-pay | Admitting: *Deleted

## 2022-01-25 ENCOUNTER — Telehealth: Payer: Self-pay

## 2022-01-25 DIAGNOSIS — C549 Malignant neoplasm of corpus uteri, unspecified: Secondary | ICD-10-CM

## 2022-01-25 DIAGNOSIS — C775 Secondary and unspecified malignant neoplasm of intrapelvic lymph nodes: Secondary | ICD-10-CM

## 2022-01-25 NOTE — Telephone Encounter (Signed)
Please schedule patient for labs next week. Please notify patient of appt.

## 2022-01-25 NOTE — Telephone Encounter (Signed)
-----   Message from Earlie Server, MD sent at 01/24/2022 11:57 PM EDT ----- Repeat another cbc in 1 week.

## 2022-01-28 DIAGNOSIS — Z510 Encounter for antineoplastic radiation therapy: Secondary | ICD-10-CM | POA: Diagnosis not present

## 2022-01-29 ENCOUNTER — Encounter: Payer: Self-pay | Admitting: Oncology

## 2022-01-30 ENCOUNTER — Ambulatory Visit: Admission: RE | Admit: 2022-01-30 | Payer: Medicare Other | Source: Ambulatory Visit

## 2022-01-30 ENCOUNTER — Ambulatory Visit: Payer: Medicare Other | Admitting: Oncology

## 2022-01-30 ENCOUNTER — Ambulatory Visit: Payer: Medicare Other

## 2022-01-30 ENCOUNTER — Inpatient Hospital Stay: Payer: Medicare Other

## 2022-01-30 ENCOUNTER — Other Ambulatory Visit: Payer: Medicare Other

## 2022-01-30 DIAGNOSIS — Z923 Personal history of irradiation: Secondary | ICD-10-CM | POA: Insufficient documentation

## 2022-01-30 DIAGNOSIS — L4050 Arthropathic psoriasis, unspecified: Secondary | ICD-10-CM | POA: Insufficient documentation

## 2022-01-30 DIAGNOSIS — M069 Rheumatoid arthritis, unspecified: Secondary | ICD-10-CM | POA: Insufficient documentation

## 2022-01-30 DIAGNOSIS — Z90710 Acquired absence of both cervix and uterus: Secondary | ICD-10-CM | POA: Insufficient documentation

## 2022-01-30 DIAGNOSIS — C775 Secondary and unspecified malignant neoplasm of intrapelvic lymph nodes: Secondary | ICD-10-CM

## 2022-01-30 DIAGNOSIS — C55 Malignant neoplasm of uterus, part unspecified: Secondary | ICD-10-CM | POA: Insufficient documentation

## 2022-01-30 DIAGNOSIS — Z90722 Acquired absence of ovaries, bilateral: Secondary | ICD-10-CM | POA: Insufficient documentation

## 2022-01-30 DIAGNOSIS — Z9221 Personal history of antineoplastic chemotherapy: Secondary | ICD-10-CM | POA: Insufficient documentation

## 2022-01-30 DIAGNOSIS — Z6841 Body Mass Index (BMI) 40.0 and over, adult: Secondary | ICD-10-CM | POA: Insufficient documentation

## 2022-01-30 DIAGNOSIS — Z780 Asymptomatic menopausal state: Secondary | ICD-10-CM | POA: Insufficient documentation

## 2022-01-30 DIAGNOSIS — Z510 Encounter for antineoplastic radiation therapy: Secondary | ICD-10-CM | POA: Diagnosis not present

## 2022-01-30 LAB — CBC WITH DIFFERENTIAL/PLATELET
Abs Immature Granulocytes: 0.01 K/uL (ref 0.00–0.07)
Basophils Absolute: 0 K/uL (ref 0.0–0.1)
Basophils Relative: 0 %
Eosinophils Absolute: 0 K/uL (ref 0.0–0.5)
Eosinophils Relative: 0 %
HCT: 25.1 % — ABNORMAL LOW (ref 36.0–46.0)
Hemoglobin: 8.4 g/dL — ABNORMAL LOW (ref 12.0–15.0)
Immature Granulocytes: 0 %
Lymphocytes Relative: 19 %
Lymphs Abs: 0.7 K/uL (ref 0.7–4.0)
MCH: 33.1 pg (ref 26.0–34.0)
MCHC: 33.5 g/dL (ref 30.0–36.0)
MCV: 98.8 fL (ref 80.0–100.0)
Monocytes Absolute: 0.6 K/uL (ref 0.1–1.0)
Monocytes Relative: 15 %
Neutro Abs: 2.6 K/uL (ref 1.7–7.7)
Neutrophils Relative %: 66 %
Platelets: 73 K/uL — ABNORMAL LOW (ref 150–400)
RBC: 2.54 MIL/uL — ABNORMAL LOW (ref 3.87–5.11)
RDW: 18.1 % — ABNORMAL HIGH (ref 11.5–15.5)
WBC: 3.9 K/uL — ABNORMAL LOW (ref 4.0–10.5)
nRBC: 0 % (ref 0.0–0.2)

## 2022-01-31 ENCOUNTER — Encounter: Payer: Self-pay | Admitting: Oncology

## 2022-01-31 ENCOUNTER — Other Ambulatory Visit: Payer: Self-pay

## 2022-01-31 ENCOUNTER — Ambulatory Visit
Admission: RE | Admit: 2022-01-31 | Discharge: 2022-01-31 | Disposition: A | Discharge status: Home / Self Care | Payer: Medicare Other | Source: Ambulatory Visit | Attending: Radiation Oncology | Admitting: Radiation Oncology

## 2022-01-31 DIAGNOSIS — Z510 Encounter for antineoplastic radiation therapy: Secondary | ICD-10-CM | POA: Diagnosis not present

## 2022-01-31 LAB — RAD ONC ARIA SESSION SUMMARY
Course Elapsed Days: 0
Plan Fractions Treated to Date: 1
Plan Prescribed Dose Per Fraction: 1.8 Gy
Plan Total Fractions Prescribed: 25
Plan Total Prescribed Dose: 45 Gy
Reference Point Dosage Given to Date: 1.8 Gy
Reference Point Session Dosage Given: 1.8 Gy
Session Number: 1

## 2022-02-01 ENCOUNTER — Ambulatory Visit
Admission: RE | Admit: 2022-02-01 | Discharge: 2022-02-01 | Disposition: A | Discharge status: Home / Self Care | Payer: Medicare Other | Source: Ambulatory Visit | Attending: Radiation Oncology | Admitting: Radiation Oncology

## 2022-02-01 ENCOUNTER — Other Ambulatory Visit: Payer: Self-pay

## 2022-02-01 ENCOUNTER — Ambulatory Visit: Payer: Medicare Other

## 2022-02-01 DIAGNOSIS — Z510 Encounter for antineoplastic radiation therapy: Secondary | ICD-10-CM | POA: Diagnosis not present

## 2022-02-01 LAB — RAD ONC ARIA SESSION SUMMARY
Course Elapsed Days: 1
Plan Fractions Treated to Date: 2
Plan Prescribed Dose Per Fraction: 1.8 Gy
Plan Total Fractions Prescribed: 25
Plan Total Prescribed Dose: 45 Gy
Reference Point Dosage Given to Date: 3.6 Gy
Reference Point Session Dosage Given: 1.8 Gy
Session Number: 2

## 2022-02-04 ENCOUNTER — Other Ambulatory Visit: Payer: Self-pay | Admitting: Family Medicine

## 2022-02-04 ENCOUNTER — Ambulatory Visit
Admission: RE | Admit: 2022-02-04 | Discharge: 2022-02-04 | Disposition: A | Discharge status: Home / Self Care | Payer: Medicare Other | Source: Ambulatory Visit | Attending: Radiation Oncology | Admitting: Radiation Oncology

## 2022-02-04 ENCOUNTER — Other Ambulatory Visit: Payer: Self-pay

## 2022-02-04 ENCOUNTER — Telehealth: Payer: Self-pay

## 2022-02-04 DIAGNOSIS — Z510 Encounter for antineoplastic radiation therapy: Secondary | ICD-10-CM | POA: Diagnosis not present

## 2022-02-04 DIAGNOSIS — R202 Paresthesia of skin: Secondary | ICD-10-CM

## 2022-02-04 DIAGNOSIS — C775 Secondary and unspecified malignant neoplasm of intrapelvic lymph nodes: Secondary | ICD-10-CM

## 2022-02-04 LAB — RAD ONC ARIA SESSION SUMMARY
Course Elapsed Days: 4
Plan Fractions Treated to Date: 3
Plan Prescribed Dose Per Fraction: 1.8 Gy
Plan Total Fractions Prescribed: 25
Plan Total Prescribed Dose: 45 Gy
Reference Point Dosage Given to Date: 5.4 Gy
Reference Point Session Dosage Given: 1.8 Gy
Session Number: 3

## 2022-02-04 NOTE — Telephone Encounter (Signed)
-----   Message from Taylor Server, MD sent at 02/03/2022  2:49 PM EDT ----- Please arrange patient to repeat a CBC during week of 02/11/2022

## 2022-02-04 NOTE — Telephone Encounter (Signed)
Last seen 6/13

## 2022-02-04 NOTE — Telephone Encounter (Signed)
Please schedule patient for labs week of 8/21. Please notify patient. Thanks

## 2022-02-04 NOTE — Telephone Encounter (Signed)
Patient already scheduled for lab on 8/23 (cbc)

## 2022-02-05 ENCOUNTER — Other Ambulatory Visit: Payer: Self-pay

## 2022-02-05 ENCOUNTER — Ambulatory Visit
Admission: RE | Admit: 2022-02-05 | Discharge: 2022-02-05 | Disposition: A | Discharge status: Home / Self Care | Payer: Medicare Other | Source: Ambulatory Visit | Attending: Radiation Oncology | Admitting: Radiation Oncology

## 2022-02-05 DIAGNOSIS — Z510 Encounter for antineoplastic radiation therapy: Secondary | ICD-10-CM | POA: Diagnosis not present

## 2022-02-05 LAB — RAD ONC ARIA SESSION SUMMARY
Course Elapsed Days: 5
Plan Fractions Treated to Date: 4
Plan Prescribed Dose Per Fraction: 1.8 Gy
Plan Total Fractions Prescribed: 25
Plan Total Prescribed Dose: 45 Gy
Reference Point Dosage Given to Date: 7.2 Gy
Reference Point Session Dosage Given: 1.8 Gy
Session Number: 4

## 2022-02-06 ENCOUNTER — Ambulatory Visit
Admission: RE | Admit: 2022-02-06 | Discharge: 2022-02-06 | Disposition: A | Discharge status: Home / Self Care | Payer: Medicare Other | Source: Ambulatory Visit | Attending: Radiation Oncology | Admitting: Radiation Oncology

## 2022-02-06 ENCOUNTER — Other Ambulatory Visit: Payer: Self-pay

## 2022-02-06 ENCOUNTER — Inpatient Hospital Stay: Payer: Medicare Other

## 2022-02-06 DIAGNOSIS — Z510 Encounter for antineoplastic radiation therapy: Secondary | ICD-10-CM | POA: Diagnosis not present

## 2022-02-06 DIAGNOSIS — C549 Malignant neoplasm of corpus uteri, unspecified: Secondary | ICD-10-CM

## 2022-02-06 LAB — RAD ONC ARIA SESSION SUMMARY
Course Elapsed Days: 6
Plan Fractions Treated to Date: 5
Plan Prescribed Dose Per Fraction: 1.8 Gy
Plan Total Fractions Prescribed: 25
Plan Total Prescribed Dose: 45 Gy
Reference Point Dosage Given to Date: 9 Gy
Reference Point Session Dosage Given: 1.8 Gy
Session Number: 5

## 2022-02-06 LAB — CBC
HCT: 24.2 % — ABNORMAL LOW (ref 36.0–46.0)
Hemoglobin: 7.9 g/dL — ABNORMAL LOW (ref 12.0–15.0)
MCH: 32.6 pg (ref 26.0–34.0)
MCHC: 32.6 g/dL (ref 30.0–36.0)
MCV: 100 fL (ref 80.0–100.0)
Platelets: 103 10*3/uL — ABNORMAL LOW (ref 150–400)
RBC: 2.42 MIL/uL — ABNORMAL LOW (ref 3.87–5.11)
RDW: 18 % — ABNORMAL HIGH (ref 11.5–15.5)
WBC: 2.9 10*3/uL — ABNORMAL LOW (ref 4.0–10.5)
nRBC: 0 % (ref 0.0–0.2)

## 2022-02-06 LAB — DIFFERENTIAL
Abs Immature Granulocytes: 0.03 10*3/uL (ref 0.00–0.07)
Basophils Absolute: 0 10*3/uL (ref 0.0–0.1)
Basophils Relative: 0 %
Eosinophils Absolute: 0 10*3/uL (ref 0.0–0.5)
Eosinophils Relative: 0 %
Immature Granulocytes: 1 %
Lymphocytes Relative: 21 %
Lymphs Abs: 0.6 10*3/uL — ABNORMAL LOW (ref 0.7–4.0)
Monocytes Absolute: 0.3 10*3/uL (ref 0.1–1.0)
Monocytes Relative: 12 %
Neutro Abs: 1.8 10*3/uL (ref 1.7–7.7)
Neutrophils Relative %: 66 %

## 2022-02-07 ENCOUNTER — Other Ambulatory Visit: Payer: Self-pay

## 2022-02-07 ENCOUNTER — Ambulatory Visit
Admission: RE | Admit: 2022-02-07 | Discharge: 2022-02-07 | Disposition: A | Discharge status: Home / Self Care | Payer: Medicare Other | Source: Ambulatory Visit | Attending: Radiation Oncology | Admitting: Radiation Oncology

## 2022-02-07 DIAGNOSIS — Z510 Encounter for antineoplastic radiation therapy: Secondary | ICD-10-CM | POA: Diagnosis not present

## 2022-02-07 LAB — RAD ONC ARIA SESSION SUMMARY
Course Elapsed Days: 7
Plan Fractions Treated to Date: 6
Plan Prescribed Dose Per Fraction: 1.8 Gy
Plan Total Fractions Prescribed: 25
Plan Total Prescribed Dose: 45 Gy
Reference Point Dosage Given to Date: 10.8 Gy
Reference Point Session Dosage Given: 1.8 Gy
Session Number: 6

## 2022-02-08 ENCOUNTER — Ambulatory Visit
Admission: RE | Admit: 2022-02-08 | Discharge: 2022-02-08 | Disposition: A | Discharge status: Home / Self Care | Payer: Medicare Other | Source: Ambulatory Visit | Attending: Radiation Oncology | Admitting: Radiation Oncology

## 2022-02-08 ENCOUNTER — Other Ambulatory Visit: Payer: Self-pay

## 2022-02-08 DIAGNOSIS — Z510 Encounter for antineoplastic radiation therapy: Secondary | ICD-10-CM | POA: Diagnosis not present

## 2022-02-08 LAB — RAD ONC ARIA SESSION SUMMARY
Course Elapsed Days: 8
Plan Fractions Treated to Date: 7
Plan Prescribed Dose Per Fraction: 1.8 Gy
Plan Total Fractions Prescribed: 25
Plan Total Prescribed Dose: 45 Gy
Reference Point Dosage Given to Date: 12.6 Gy
Reference Point Session Dosage Given: 1.8 Gy
Session Number: 7

## 2022-02-11 ENCOUNTER — Ambulatory Visit
Admission: RE | Admit: 2022-02-11 | Discharge: 2022-02-11 | Disposition: A | Discharge status: Home / Self Care | Payer: Medicare Other | Source: Ambulatory Visit | Attending: Radiation Oncology | Admitting: Radiation Oncology

## 2022-02-11 ENCOUNTER — Other Ambulatory Visit: Payer: Self-pay

## 2022-02-11 DIAGNOSIS — Z510 Encounter for antineoplastic radiation therapy: Secondary | ICD-10-CM | POA: Diagnosis not present

## 2022-02-11 LAB — RAD ONC ARIA SESSION SUMMARY
Course Elapsed Days: 11
Plan Fractions Treated to Date: 8
Plan Prescribed Dose Per Fraction: 1.8 Gy
Plan Total Fractions Prescribed: 25
Plan Total Prescribed Dose: 45 Gy
Reference Point Dosage Given to Date: 14.4 Gy
Reference Point Session Dosage Given: 1.8 Gy
Session Number: 8

## 2022-02-12 ENCOUNTER — Other Ambulatory Visit: Payer: Self-pay

## 2022-02-12 ENCOUNTER — Ambulatory Visit
Admission: RE | Admit: 2022-02-12 | Discharge: 2022-02-12 | Disposition: A | Discharge status: Home / Self Care | Payer: Medicare Other | Source: Ambulatory Visit | Attending: Radiation Oncology | Admitting: Radiation Oncology

## 2022-02-12 DIAGNOSIS — Z510 Encounter for antineoplastic radiation therapy: Secondary | ICD-10-CM | POA: Diagnosis not present

## 2022-02-12 LAB — RAD ONC ARIA SESSION SUMMARY
Course Elapsed Days: 12
Plan Fractions Treated to Date: 9
Plan Prescribed Dose Per Fraction: 1.8 Gy
Plan Total Fractions Prescribed: 25
Plan Total Prescribed Dose: 45 Gy
Reference Point Dosage Given to Date: 16.2 Gy
Reference Point Session Dosage Given: 1.8 Gy
Session Number: 9

## 2022-02-13 ENCOUNTER — Other Ambulatory Visit: Payer: Self-pay

## 2022-02-13 ENCOUNTER — Encounter: Payer: Self-pay | Admitting: Obstetrics and Gynecology

## 2022-02-13 ENCOUNTER — Other Ambulatory Visit: Payer: Self-pay | Admitting: Family Medicine

## 2022-02-13 ENCOUNTER — Inpatient Hospital Stay (HOSPITAL_BASED_OUTPATIENT_CLINIC_OR_DEPARTMENT_OTHER): Payer: Medicare Other | Admitting: Obstetrics and Gynecology

## 2022-02-13 ENCOUNTER — Ambulatory Visit
Admission: RE | Admit: 2022-02-13 | Discharge: 2022-02-13 | Disposition: A | Discharge status: Home / Self Care | Payer: Medicare Other | Source: Ambulatory Visit | Attending: Radiation Oncology | Admitting: Radiation Oncology

## 2022-02-13 ENCOUNTER — Inpatient Hospital Stay: Payer: Medicare Other

## 2022-02-13 VITALS — BP 122/55 | HR 105 | Temp 98.7°F | Resp 20 | Wt 256.6 lb

## 2022-02-13 DIAGNOSIS — E890 Postprocedural hypothyroidism: Secondary | ICD-10-CM

## 2022-02-13 DIAGNOSIS — C55 Malignant neoplasm of uterus, part unspecified: Secondary | ICD-10-CM

## 2022-02-13 DIAGNOSIS — C775 Secondary and unspecified malignant neoplasm of intrapelvic lymph nodes: Secondary | ICD-10-CM

## 2022-02-13 DIAGNOSIS — Z510 Encounter for antineoplastic radiation therapy: Secondary | ICD-10-CM | POA: Diagnosis not present

## 2022-02-13 LAB — CBC WITH DIFFERENTIAL/PLATELET
Abs Immature Granulocytes: 0.04 10*3/uL (ref 0.00–0.07)
Basophils Absolute: 0 10*3/uL (ref 0.0–0.1)
Basophils Relative: 0 %
Eosinophils Absolute: 0.1 10*3/uL (ref 0.0–0.5)
Eosinophils Relative: 2 %
HCT: 20.3 % — ABNORMAL LOW (ref 36.0–46.0)
Hemoglobin: 6.4 g/dL — ABNORMAL LOW (ref 12.0–15.0)
Immature Granulocytes: 1 %
Lymphocytes Relative: 18 %
Lymphs Abs: 0.6 10*3/uL — ABNORMAL LOW (ref 0.7–4.0)
MCH: 33 pg (ref 26.0–34.0)
MCHC: 31.5 g/dL (ref 30.0–36.0)
MCV: 104.6 fL — ABNORMAL HIGH (ref 80.0–100.0)
Monocytes Absolute: 0.6 10*3/uL (ref 0.1–1.0)
Monocytes Relative: 20 %
Neutro Abs: 1.8 10*3/uL (ref 1.7–7.7)
Neutrophils Relative %: 59 %
Platelets: 84 10*3/uL — ABNORMAL LOW (ref 150–400)
RBC: 1.94 MIL/uL — ABNORMAL LOW (ref 3.87–5.11)
RDW: 19.4 % — ABNORMAL HIGH (ref 11.5–15.5)
WBC: 3.2 10*3/uL — ABNORMAL LOW (ref 4.0–10.5)
nRBC: 1 % — ABNORMAL HIGH (ref 0.0–0.2)

## 2022-02-13 LAB — RAD ONC ARIA SESSION SUMMARY
Course Elapsed Days: 13
Plan Fractions Treated to Date: 10
Plan Prescribed Dose Per Fraction: 1.8 Gy
Plan Total Fractions Prescribed: 25
Plan Total Prescribed Dose: 45 Gy
Reference Point Dosage Given to Date: 18 Gy
Reference Point Session Dosage Given: 1.8 Gy
Session Number: 10
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
## 2022-02-13 NOTE — Progress Notes (Signed)
Patient become light headed and dizzy while getting a weight.

## 2022-02-14 ENCOUNTER — Ambulatory Visit
Admission: RE | Admit: 2022-02-14 | Discharge: 2022-02-14 | Disposition: A | Discharge status: Home / Self Care | Payer: Medicare Other | Source: Ambulatory Visit | Attending: Radiation Oncology | Admitting: Radiation Oncology

## 2022-02-14 ENCOUNTER — Other Ambulatory Visit: Payer: Self-pay

## 2022-02-14 DIAGNOSIS — Z510 Encounter for antineoplastic radiation therapy: Secondary | ICD-10-CM | POA: Diagnosis not present

## 2022-02-14 LAB — RAD ONC ARIA SESSION SUMMARY
Course Elapsed Days: 14
Plan Fractions Treated to Date: 11
Plan Prescribed Dose Per Fraction: 1.8 Gy
Plan Total Fractions Prescribed: 25
Plan Total Prescribed Dose: 45 Gy
Reference Point Dosage Given to Date: 19.8 Gy
Reference Point Session Dosage Given: 1.8 Gy
Session Number: 11

## 2022-02-15 ENCOUNTER — Ambulatory Visit
Admission: RE | Admit: 2022-02-15 | Discharge: 2022-02-15 | Disposition: A | Discharge status: Home / Self Care | Payer: Medicare Other | Source: Ambulatory Visit | Attending: Radiation Oncology | Admitting: Radiation Oncology

## 2022-02-15 ENCOUNTER — Other Ambulatory Visit: Payer: Self-pay

## 2022-02-15 DIAGNOSIS — Z510 Encounter for antineoplastic radiation therapy: Secondary | ICD-10-CM | POA: Diagnosis not present

## 2022-02-15 LAB — RAD ONC ARIA SESSION SUMMARY
Course Elapsed Days: 15
Plan Fractions Treated to Date: 12
Plan Prescribed Dose Per Fraction: 1.8 Gy
Plan Total Fractions Prescribed: 25
Plan Total Prescribed Dose: 45 Gy
Reference Point Dosage Given to Date: 21.6 Gy
Reference Point Session Dosage Given: 1.8 Gy
Session Number: 12

## 2022-02-18 ENCOUNTER — Other Ambulatory Visit: Payer: Self-pay

## 2022-02-18 ENCOUNTER — Ambulatory Visit
Admission: RE | Admit: 2022-02-18 | Discharge: 2022-02-18 | Disposition: A | Discharge status: Home / Self Care | Payer: Medicare Other | Source: Ambulatory Visit | Attending: Radiation Oncology | Admitting: Radiation Oncology

## 2022-02-18 DIAGNOSIS — Z510 Encounter for antineoplastic radiation therapy: Secondary | ICD-10-CM | POA: Diagnosis not present

## 2022-02-18 LAB — RAD ONC ARIA SESSION SUMMARY
Course Elapsed Days: 18
Plan Fractions Treated to Date: 13
Plan Prescribed Dose Per Fraction: 1.8 Gy
Plan Total Fractions Prescribed: 25
Plan Total Prescribed Dose: 45 Gy
Reference Point Dosage Given to Date: 23.4 Gy
Reference Point Session Dosage Given: 1.8 Gy
Session Number: 13

## 2022-02-19 ENCOUNTER — Inpatient Hospital Stay: Payer: Medicare Other

## 2022-02-19 ENCOUNTER — Telehealth: Payer: Self-pay

## 2022-02-19 ENCOUNTER — Other Ambulatory Visit: Payer: Self-pay

## 2022-02-19 ENCOUNTER — Observation Stay
Admission: EM | Admit: 2022-02-19 | Discharge: 2022-02-20 | Disposition: A | Discharge status: Home / Self Care | Payer: Medicare Other | Attending: Internal Medicine | Admitting: Internal Medicine

## 2022-02-19 ENCOUNTER — Ambulatory Visit
Admission: RE | Admit: 2022-02-19 | Discharge: 2022-02-19 | Disposition: A | Discharge status: Home / Self Care | Payer: Medicare Other | Source: Ambulatory Visit | Attending: Radiation Oncology | Admitting: Radiation Oncology

## 2022-02-19 ENCOUNTER — Encounter: Payer: Self-pay | Admitting: *Deleted

## 2022-02-19 DIAGNOSIS — I10 Essential (primary) hypertension: Secondary | ICD-10-CM | POA: Diagnosis not present

## 2022-02-19 DIAGNOSIS — E1169 Type 2 diabetes mellitus with other specified complication: Secondary | ICD-10-CM

## 2022-02-19 DIAGNOSIS — E039 Hypothyroidism, unspecified: Secondary | ICD-10-CM | POA: Insufficient documentation

## 2022-02-19 DIAGNOSIS — Z7982 Long term (current) use of aspirin: Secondary | ICD-10-CM | POA: Diagnosis not present

## 2022-02-19 DIAGNOSIS — D649 Anemia, unspecified: Principal | ICD-10-CM

## 2022-02-19 DIAGNOSIS — E1129 Type 2 diabetes mellitus with other diabetic kidney complication: Secondary | ICD-10-CM | POA: Diagnosis present

## 2022-02-19 DIAGNOSIS — C55 Malignant neoplasm of uterus, part unspecified: Secondary | ICD-10-CM

## 2022-02-19 DIAGNOSIS — J3089 Other allergic rhinitis: Secondary | ICD-10-CM

## 2022-02-19 DIAGNOSIS — R809 Proteinuria, unspecified: Secondary | ICD-10-CM | POA: Insufficient documentation

## 2022-02-19 DIAGNOSIS — Z79899 Other long term (current) drug therapy: Secondary | ICD-10-CM | POA: Insufficient documentation

## 2022-02-19 DIAGNOSIS — E785 Hyperlipidemia, unspecified: Secondary | ICD-10-CM | POA: Diagnosis present

## 2022-02-19 DIAGNOSIS — Z510 Encounter for antineoplastic radiation therapy: Secondary | ICD-10-CM | POA: Diagnosis not present

## 2022-02-19 LAB — RAD ONC ARIA SESSION SUMMARY
Course Elapsed Days: 19
Plan Fractions Treated to Date: 14
Plan Prescribed Dose Per Fraction: 1.8 Gy
Plan Total Fractions Prescribed: 25
Plan Total Prescribed Dose: 45 Gy
Reference Point Dosage Given to Date: 25.2 Gy
Reference Point Session Dosage Given: 1.8 Gy
Session Number: 14

## 2022-02-19 LAB — COMPREHENSIVE METABOLIC PANEL
ALT: 29 U/L (ref 0–44)
AST: 42 U/L — ABNORMAL HIGH (ref 15–41)
Albumin: 3.7 g/dL (ref 3.5–5.0)
Alkaline Phosphatase: 99 U/L (ref 38–126)
Anion gap: 7 (ref 5–15)
BUN: 19 mg/dL (ref 8–23)
CO2: 26 mmol/L (ref 22–32)
Calcium: 8.9 mg/dL (ref 8.9–10.3)
Chloride: 108 mmol/L (ref 98–111)
Creatinine, Ser: 0.96 mg/dL (ref 0.44–1.00)
GFR, Estimated: >60 mL/min (ref >60)
Glucose, Bld: 106 mg/dL — ABNORMAL HIGH (ref 70–99)
Potassium: 4 mmol/L (ref 3.5–5.1)
Sodium: 141 mmol/L (ref 135–145)
Total Bilirubin: 1 mg/dL (ref 0.3–1.2)
Total Protein: 7.6 g/dL (ref 6.5–8.1)

## 2022-02-19 LAB — CBC WITH DIFFERENTIAL/PLATELET
Abs Immature Granulocytes: 0.02 10*3/uL (ref 0.00–0.07)
Basophils Absolute: 0 10*3/uL (ref 0.0–0.1)
Basophils Relative: 0 %
Eosinophils Absolute: 0.1 10*3/uL (ref 0.0–0.5)
Eosinophils Relative: 3 %
HCT: 18.1 % — ABNORMAL LOW (ref 36.0–46.0)
Hemoglobin: 5.8 g/dL — ABNORMAL LOW (ref 12.0–15.0)
Immature Granulocytes: 1 %
Lymphocytes Relative: 14 %
Lymphs Abs: 0.5 10*3/uL — ABNORMAL LOW (ref 0.7–4.0)
MCH: 34.7 pg — ABNORMAL HIGH (ref 26.0–34.0)
MCHC: 32 g/dL (ref 30.0–36.0)
MCV: 108.4 fL — ABNORMAL HIGH (ref 80.0–100.0)
Monocytes Absolute: 0.7 10*3/uL (ref 0.1–1.0)
Monocytes Relative: 19 %
Neutro Abs: 2.2 10*3/uL (ref 1.7–7.7)
Neutrophils Relative %: 63 %
Platelets: 75 10*3/uL — ABNORMAL LOW (ref 150–400)
RBC: 1.67 MIL/uL — ABNORMAL LOW (ref 3.87–5.11)
RDW: 20.7 % — ABNORMAL HIGH (ref 11.5–15.5)
WBC: 3.5 10*3/uL — ABNORMAL LOW (ref 4.0–10.5)
nRBC: 0 % (ref 0.0–0.2)

## 2022-02-19 LAB — CBG MONITORING, ED: Glucose-Capillary: 94 mg/dL (ref 70–99)

## 2022-02-19 LAB — SAMPLE TO BLOOD BANK

## 2022-02-19 MED ORDER — IRBESARTAN 150 MG PO TABS
150.0000 mg | ORAL_TABLET | Freq: Every day | ORAL | Status: DC
  Administered 2022-02-20: 150 mg via ORAL
  Filled 2022-02-19: qty 1

## 2022-02-19 MED ORDER — ACETAMINOPHEN 500 MG PO TABS: 1000.0000 mg | ORAL_TABLET | Freq: Four times a day (QID) | ORAL | Status: DC | PRN

## 2022-02-19 MED ORDER — SODIUM CHLORIDE 0.9 % IV SOLN: INTRAVENOUS | Status: DC

## 2022-02-19 MED ORDER — LORATADINE 10 MG PO TABS
10.0000 mg | ORAL_TABLET | Freq: Every day | ORAL | Status: DC | PRN
Start: 2022-02-19 — End: 2022-02-20

## 2022-02-19 MED ORDER — HEPARIN SODIUM (PORCINE) 5000 UNIT/ML IJ SOLN
5000.0000 [IU] | Freq: Three times a day (TID) | INTRAMUSCULAR | Status: DC
  Administered 2022-02-19 – 2022-02-20 (×2): 5000 [IU] via SUBCUTANEOUS
  Filled 2022-02-19 (×2): qty 1

## 2022-02-19 MED ORDER — LEVOTHYROXINE SODIUM 50 MCG PO TABS
175.0000 ug | ORAL_TABLET | Freq: Every day | ORAL | Status: DC
  Administered 2022-02-20: 175 ug via ORAL
  Filled 2022-02-19: qty 2

## 2022-02-19 MED ORDER — POTASSIUM CHLORIDE CRYS ER 20 MEQ PO TBCR
20.0000 meq | EXTENDED_RELEASE_TABLET | Freq: Every day | ORAL | Status: DC
  Administered 2022-02-20: 20 meq via ORAL
  Filled 2022-02-19: qty 1

## 2022-02-19 MED ORDER — IRBESARTAN-HYDROCHLOROTHIAZIDE 150-12.5 MG PO TABS: 1.0000 | ORAL_TABLET | ORAL | Status: DC

## 2022-02-19 MED ORDER — ASPIRIN 81 MG PO TBEC
81.0000 mg | DELAYED_RELEASE_TABLET | Freq: Every day | ORAL | Status: DC
  Administered 2022-02-20: 81 mg via ORAL
  Filled 2022-02-19: qty 1

## 2022-02-19 MED ORDER — TRAZODONE HCL 50 MG PO TABS: 25.0000 mg | ORAL_TABLET | Freq: Every evening | ORAL | Status: DC | PRN

## 2022-02-19 MED ORDER — HYDROCHLOROTHIAZIDE 12.5 MG PO TABS
12.5000 mg | ORAL_TABLET | Freq: Every day | ORAL | Status: DC
  Administered 2022-02-20: 12.5 mg via ORAL
  Filled 2022-02-19: qty 1

## 2022-02-19 MED ORDER — DEXAMETHASONE 4 MG PO TABS
8.0000 mg | ORAL_TABLET | Freq: Every day | ORAL | Status: AC
  Administered 2022-02-19 – 2022-02-20 (×2): 8 mg via ORAL
  Filled 2022-02-19 (×2): qty 2

## 2022-02-19 MED ORDER — PROCHLORPERAZINE MALEATE 10 MG PO TABS: 10.0000 mg | ORAL_TABLET | Freq: Four times a day (QID) | ORAL | Status: DC | PRN

## 2022-02-19 MED ORDER — GABAPENTIN 100 MG PO CAPS
100.0000 mg | ORAL_CAPSULE | Freq: Every day | ORAL | Status: DC
  Administered 2022-02-19: 100 mg via ORAL
  Filled 2022-02-19: qty 1

## 2022-02-19 MED ORDER — FLUTICASONE PROPIONATE 50 MCG/ACT NA SUSP: 2.0000 | Freq: Every day | NASAL | Status: DC | PRN

## 2022-02-19 MED ORDER — SODIUM CHLORIDE 0.9 % IV SOLN: 10.0000 mL/h | Freq: Once | INTRAVENOUS | Status: DC

## 2022-02-19 MED ORDER — ROSUVASTATIN CALCIUM 10 MG PO TABS: 5.0000 mg | ORAL_TABLET | Freq: Every evening | ORAL | Status: DC

## 2022-02-19 NOTE — ED Provider Notes (Addendum)
   [facility] Provider Note    Event Date/Time   First MD Initiated Contact with Patient 02/19/22 1708     (approximate)   History   Abnormal Lab   HPI  Taylor Perry is a 71 y.o. female sent from the cancer center for symptomatic anemia and she is feeling weak lightheaded some shortness of breath with exertion.  Hemoglobin has gone from 8.4 last month to 7.96.4 now 5.8 today.  Patient somewhat tachycardic at 113.  She has no fever no other problems has a history of diet-controlled diabetes and hypertension which is also diet controlled she does not take any medicine she says.      Physical Exam   Triage Vital Signs: ED Triage Vitals  Enc Vitals Group     BP 02/19/22 1614 105/66     Pulse Rate 02/19/22 1614 (!) 113     Resp 02/19/22 1614 20     Temp 02/19/22 1614 98.7 F (37.1 C)     Temp Source 02/19/22 1614 Oral     SpO2 02/19/22 1614 93 %     Weight 02/19/22 1611 255 lb (115.7 kg)     Height 02/19/22 1611 '5\' 7"'$  (1.702 m)     Head Circumference --      Peak Flow --      Pain Score 02/19/22 1611 0     Pain Loc --      Pain Edu? --      Excl. in [place]? --     Most recent vital signs: Vitals:   02/19/22 1614  BP: 105/66  Pulse: (!) 113  Resp: 20  Temp: 98.7 F (37.1 C)  SpO2: 93%     General: Awake, no distress.  CV:  Good peripheral perfusion.  Heart regular rate and rhythm no audible murmurs Resp:  Normal effort.  Lungs are clear Abd:  No distention. \Abdomen soft nontender Skin slightly pale   ED Results / Procedures / Treatments   Labs (all labs ordered are listed, but only abnormal results are displayed) Labs Reviewed  COMPREHENSIVE METABOLIC PANEL  PREPARE RBC (CROSSMATCH)  TYPE AND SCREEN     EKG  EKG read and interpreted by me shows normal sinus rhythm rate of 89 normal axis nonspecific ST-T wave changes very similar to EKG from February of this year.   RADIOLOGY   PROCEDURES:  Critical Care performed:    Procedures   MEDICATIONS ORDERED IN ED: Medications  0.9 %  sodium chloride infusion (has no administration in time range)     IMPRESSION / MDM / ASSESSMENT AND PLAN / ED COURSE  I reviewed the triage vital signs and the nursing notes. Patient with symptomatic anemia getting chemo will need a transfusion.    Patient's presentation is most consistent with acute presentation of life or limb threatening illness  The patient is on the cardiac monitor to evaluate for evidence of arrhythmia and/or significant heart rate changes.  Nothing but sinus tach seen      FINAL CLINICAL IMPRESSION(S) / ED DIAGNOSES   Final diagnoses:  Symptomatic anemia     Rx / DC Orders   ED Discharge Orders     None        Note:  This document was prepared using Dragon voice recognition software and may include unintentional dictation errors.   Nena Polio, MD 02/19/22 1717    Nena Polio, MD 02/19/22 [phone]
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
## 2022-02-19 NOTE — Assessment & Plan Note (Signed)
BP stable  Plan Continue home medications

## 2022-02-19 NOTE — Assessment & Plan Note (Signed)
-   Continue home medications 

## 2022-02-19 NOTE — Assessment & Plan Note (Signed)
Progressive anemia 8.4 to 7.9 to 6.4 to 5.8 today. Most likely 2/2 treatment. Patient denies any active bleeding, change in stool, N/V.  Plan Transfuse 1u PRBCs  rechec H/H 2 hrs after transfusion - if Hgb < 7.0 and/or if patient remains tachycardic and SOB will need 2nd unit.

## 2022-02-19 NOTE — Subjective & Objective (Signed)
Taylor Perry, a 71 y/o undergoing XRT for uterine cancer was tachycardic and experiencing increased SOB/DOE. Her Hgb has been downward trending. At XRT today due to being symptomatic she was referred to [facility]ED to evaluate for progressive anemia.

## 2022-02-19 NOTE — Telephone Encounter (Signed)
Pt's Hemoglobin 5.8. Dr. Tasia Catchings recommending for pt to go to ER now as opposed to waiting for blood transfusion tomorrow. Pt informed and verbalized understanding. Dr.Yu has called report to ER for them administer 2 units of blood.

## 2022-02-19 NOTE — Assessment & Plan Note (Signed)
Currently on Tx 14/28 at Dr. Crystals. She is due for treatment tomorrow, 02/20/22.  Plan  if patient's d/c delayed due to need for additional uPRBCs will need to seek guidance from Dr. Donella Stade

## 2022-02-19 NOTE — Assessment & Plan Note (Signed)
Last A1C 12/04/21 6.3%. Patient takes no medications.

## 2022-02-19 NOTE — ED Triage Notes (Signed)
Pt sent from the cancer center.  Pt has hgb of 5.8, labs drawn 1 hour ago.  Pt has uterine ca.  Pt had radiation this am.  Last chemo in 12/2021.  Pt alert  speech clear.  Marland Kitchen

## 2022-02-19 NOTE — Telephone Encounter (Signed)
Pt's hemoglobin on 8/23 was 6.4. Per Dr. Tasia Catchings: repeat labs and scheudule blood transfusion asap.   Pt scheduled for labs this afternoon and blood transufusion tomorrow. Pt aware of appts.

## 2022-02-20 ENCOUNTER — Ambulatory Visit: Payer: Medicare Other

## 2022-02-20 ENCOUNTER — Inpatient Hospital Stay: Payer: Medicare Other

## 2022-02-20 ENCOUNTER — Encounter: Payer: Self-pay | Admitting: Internal Medicine

## 2022-02-20 DIAGNOSIS — C55 Malignant neoplasm of uterus, part unspecified: Secondary | ICD-10-CM | POA: Diagnosis not present

## 2022-02-20 DIAGNOSIS — D649 Anemia, unspecified: Secondary | ICD-10-CM | POA: Diagnosis not present

## 2022-02-20 LAB — IRON AND TIBC
Iron: 194 ug/dL — ABNORMAL HIGH (ref 28–170)
Saturation Ratios: 71 % — ABNORMAL HIGH (ref 10.4–31.8)
TIBC: 274 ug/dL (ref 250–450)
UIBC: 80 ug/dL

## 2022-02-20 LAB — VITAMIN B12: Vitamin B-12: 465 pg/mL (ref 180–914)

## 2022-02-20 LAB — HEMOGLOBIN AND HEMATOCRIT, BLOOD
HCT: 20.2 % — ABNORMAL LOW (ref 36.0–46.0)
HCT: 23.8 % — ABNORMAL LOW (ref 36.0–46.0)
Hemoglobin: 6.8 g/dL — ABNORMAL LOW (ref 12.0–15.0)
Hemoglobin: 8.2 g/dL — ABNORMAL LOW (ref 12.0–15.0)

## 2022-02-20 LAB — FERRITIN: Ferritin: 881 ng/mL — ABNORMAL HIGH (ref 11–307)

## 2022-02-20 LAB — PREPARE RBC (CROSSMATCH)

## 2022-02-20 MED ORDER — LORATADINE 10 MG PO TABS: 10.0000 mg | ORAL_TABLET | Freq: Every day | ORAL | Status: DC | PRN

## 2022-02-20 MED ORDER — FLUTICASONE PROPIONATE 50 MCG/ACT NA SUSP: 1.0000 | Freq: Every day | NASAL | Status: DC | PRN

## 2022-02-20 MED ORDER — SODIUM CHLORIDE 0.9% IV SOLUTION: Freq: Once | INTRAVENOUS | Status: AC

## 2022-02-20 NOTE — Discharge Summary (Signed)
Physician Discharge Summary   Patient: Taylor Perry MRN: [id number] DOB: [birth year]  Admit date:     02/19/2022  Discharge date: 02/20/22  Discharge Physician: Jennye Boroughs   PCP: Steele Sizer, MD   Recommendations at discharge:   Follow-up with PCP in 1 week Follow-up with Dr. Donella Stade, radiation oncologist, for radiation therapy as scheduled.  Discharge Diagnoses: Principal Problem:   Symptomatic anemia Active Problems:   Controlled type 2 diabetes mellitus with microalbuminuria, without long-term current use of insulin (HCC)   Carcinosarcoma of uterus ([place])   Benign essential HTN   Dyslipidemia  Resolved Problems:   Dyslipidemia associated with type 2 diabetes mellitus [facility])  Hospital Course:  Taylor Perry is a 71 year old woman with medical history significant for uterine cancer and radiation therapy, vitamin D deficiency, psoriasis with psoriatic arthritis, osteoarthritis of the leg, morbid obesity, hypertension, hypertelorism, hyperlipidemia, history of thyroid irradiation, history of shingles, diabetes mellitus type 2, hypothyroidism, abnormal mammogram.  She was referred from the outpatient cancer center because of low hemoglobin of 5.8.  She complained of lightheadedness, generalized weakness and exertional shortness of breath.  She had radiation therapy on 02/19/2022.  Last chemotherapy was in July 2023.  She was admitted to the hospital for symptomatic severe anemia.  She was transfused with 2 units of packed red blood cells and hemoglobin improved from 5.8-8.2.  Her symptoms have resolved.  She is deemed stable for discharge to home today.  Discharge plan was discussed with the patient and her daughter, Otila Kluver, at the bedside.       Consultants: None Procedures performed: None  Disposition: Home Diet recommendation:  Discharge Diet Orders (From admission, onward)     Start     Ordered   02/20/22 0000  Diet - low sodium heart healthy        02/20/22  1505   02/20/22 0000  Diet Carb Modified        02/20/22 1505           Cardiac and Carb modified diet DISCHARGE MEDICATION: Allergies as of 02/20/2022       Reactions   Atorvastatin Other (See Comments)   Joint aches and muscle cramps        Medication List     STOP taking these medications    Adalimumab 40 MG/0.4ML Pnkt   FISH OIL PO       TAKE these medications    Accu-Chek Aviva Plus test strip Generic drug: glucose blood   Accu-Chek Softclix Lancets lancets   acetaminophen 500 MG tablet Commonly known as: TYLENOL Take 2 tablets (1,000 mg total) by mouth every 6 (six) hours as needed for mild pain or moderate pain. Alternate every 3 hours with Ibuprofen   aspirin 81 MG tablet   Calcium Carbonate-Vitamin D 600-200 MG-UNIT Tabs Take 1 tablet by mouth daily.   CeraVe Crea Apply 1 application topically daily as needed (Psoriasis).   fluticasone 50 MCG/ACT nasal spray Commonly known as: FLONASE Place 1 spray into both nostrils daily as needed for allergies. USE 2 SPRAYS IN BOTH NOSTRILS  DAILY   gabapentin 100 MG capsule Commonly known as: NEURONTIN TAKE 1 CAPSULE BY MOUTH AT  BEDTIME   irbesartan-hydrochlorothiazide 150-12.5 MG tablet Commonly known as: AVALIDE Take 1 tablet by mouth every morning. for blood pressure   levothyroxine 175 MCG tablet Commonly known as: SYNTHROID Take 1 tablet (175 mcg total) by mouth daily before breakfast.   loratadine 10 MG tablet Commonly known as: CLARITIN Take  1 tablet (10 mg total) by mouth daily as needed for allergies. What changed:  when to take this reasons to take this   ondansetron 8 MG tablet Commonly known as: ZOFRAN TAKE 1 TABLET BY MOUTH TWICE A DAY AS NEEDED .START ON THE THIRD DAY AFTER CHEMOTHERAPY   potassium chloride SA 20 MEQ tablet Commonly known as: KLOR-CON M Take 1 tablet (20 mEq total) by mouth daily.   rosuvastatin 5 MG tablet Commonly known as: CRESTOR TAKE 1 TABLET BY MOUTH  IN THE  MORNING   VITAMIN B12 PO Take 1 tablet by mouth at bedtime.   Womens Multivitamin Tabs Take 1 tablet by mouth daily.        Discharge Exam: Filed Weights   02/19/22 1611 02/19/22 2254  Weight: 115.7 kg 115.9 kg   GEN: NAD SKIN: Chronic hyperpigmented changes on bilateral legs and lower mid back from psoriasis EYES: No pallor or icterus ENT: MMM CV: RRR PULM: CTA B ABD: soft, obese, NT, +BS CNS: AAO x 3, non focal EXT: No edema or tenderness   Condition at discharge: good  The results of significant diagnostics from this hospitalization (including imaging, microbiology, ancillary and laboratory) are listed below for reference.   Imaging Studies: No results found.  Microbiology: No results found for this or any previous visit.  Labs: CBC: Recent Labs  Lab 02/19/22 1521 02/20/22 0523 02/20/22 1443  WBC 3.5*  --   --   NEUTROABS 2.2  --   --   HGB 5.8* 6.8* 8.2*  HCT 18.1* 20.2* 23.8*  MCV 108.4*  --   --   PLT 75*  --   --    Basic Metabolic Panel: Recent Labs  Lab 02/19/22 1758  NA 141  K 4.0  CL 108  CO2 26  GLUCOSE 106*  BUN 19  CREATININE 0.96  CALCIUM 8.9   Liver Function Tests: Recent Labs  Lab 02/19/22 1758  AST 42*  ALT 29  ALKPHOS 99  BILITOT 1.0  PROT 7.6  ALBUMIN 3.7   CBG: Recent Labs  Lab 02/19/22 2218  GLUCAP 94    Discharge time spent: greater than 30 minutes.  Signed: Jennye Boroughs, MD [facility] 02/20/2022

## 2022-02-20 NOTE — Progress Notes (Signed)
Blood transfusion done. VSS.

## 2022-02-20 NOTE — Progress Notes (Signed)
1 unit of PRBC was hanged and verified by a second Therapist, sports . Pt was explained blood reaction s/s and she was asked to inform RN if she start experiencing any of these s/s.  VSS.

## 2022-02-20 NOTE — Progress Notes (Signed)
Discharge instructions were reviewed with pt and her daughter. Questions were answered. IV was taken out. VSS.

## 2022-02-20 NOTE — Plan of Care (Signed)

## 2022-02-21 ENCOUNTER — Inpatient Hospital Stay: Payer: Medicare Other

## 2022-02-21 ENCOUNTER — Other Ambulatory Visit: Payer: Self-pay

## 2022-02-21 ENCOUNTER — Ambulatory Visit
Admission: RE | Admit: 2022-02-21 | Discharge: 2022-02-21 | Disposition: A | Discharge status: Home / Self Care | Payer: Medicare Other | Source: Ambulatory Visit | Attending: Radiation Oncology | Admitting: Radiation Oncology

## 2022-02-21 DIAGNOSIS — Z510 Encounter for antineoplastic radiation therapy: Secondary | ICD-10-CM | POA: Diagnosis not present

## 2022-02-21 LAB — RAD ONC ARIA SESSION SUMMARY
Course Elapsed Days: 21
Plan Fractions Treated to Date: 15
Plan Prescribed Dose Per Fraction: 1.8 Gy
Plan Total Fractions Prescribed: 25
Plan Total Prescribed Dose: 45 Gy
Reference Point Dosage Given to Date: 27 Gy
Reference Point Session Dosage Given: 1.8 Gy
Session Number: 15

## 2022-02-21 LAB — PREPARE RBC (CROSSMATCH)

## 2022-02-22 ENCOUNTER — Ambulatory Visit
Admission: RE | Admit: 2022-02-22 | Discharge: 2022-02-22 | Disposition: A | Discharge status: Home / Self Care | Payer: Medicare Other | Source: Ambulatory Visit | Attending: Radiation Oncology | Admitting: Radiation Oncology

## 2022-02-22 ENCOUNTER — Other Ambulatory Visit: Payer: Self-pay

## 2022-02-22 DIAGNOSIS — Z90710 Acquired absence of both cervix and uterus: Secondary | ICD-10-CM | POA: Insufficient documentation

## 2022-02-22 DIAGNOSIS — Z510 Encounter for antineoplastic radiation therapy: Secondary | ICD-10-CM | POA: Insufficient documentation

## 2022-02-22 DIAGNOSIS — Z923 Personal history of irradiation: Secondary | ICD-10-CM | POA: Insufficient documentation

## 2022-02-22 DIAGNOSIS — Z9221 Personal history of antineoplastic chemotherapy: Secondary | ICD-10-CM | POA: Insufficient documentation

## 2022-02-22 DIAGNOSIS — L4050 Arthropathic psoriasis, unspecified: Secondary | ICD-10-CM | POA: Insufficient documentation

## 2022-02-22 DIAGNOSIS — M069 Rheumatoid arthritis, unspecified: Secondary | ICD-10-CM | POA: Insufficient documentation

## 2022-02-22 DIAGNOSIS — Z90722 Acquired absence of ovaries, bilateral: Secondary | ICD-10-CM | POA: Insufficient documentation

## 2022-02-22 DIAGNOSIS — Z6841 Body Mass Index (BMI) 40.0 and over, adult: Secondary | ICD-10-CM | POA: Insufficient documentation

## 2022-02-22 DIAGNOSIS — Z780 Asymptomatic menopausal state: Secondary | ICD-10-CM | POA: Insufficient documentation

## 2022-02-22 DIAGNOSIS — C55 Malignant neoplasm of uterus, part unspecified: Secondary | ICD-10-CM | POA: Insufficient documentation

## 2022-02-22 LAB — TYPE AND SCREEN
ABO/RH(D): O POS
Antibody Screen: POSITIVE
Unit division: 0
Unit division: 0
Unit division: 0
Unit division: 0
Unit division: 0
Unit division: 0

## 2022-02-22 LAB — BPAM RBC
Blood Product Expiration Date: 202309062359
Blood Product Expiration Date: 202309072359
Blood Product Expiration Date: 202309082359
Blood Product Expiration Date: 202309162359
Blood Product Expiration Date: 202309162359
Blood Product Expiration Date: 202309192359
ISSUE DATE / TIME: 202308292329
ISSUE DATE / TIME: 202308300947
Unit Type and Rh: 9500
Unit Type and Rh: 9500
Unit Type and Rh: 9500
Unit Type and Rh: 9500
Unit Type and Rh: 9500
Unit Type and Rh: 9500

## 2022-02-22 LAB — RAD ONC ARIA SESSION SUMMARY
Course Elapsed Days: 22
Plan Fractions Treated to Date: 16
Plan Prescribed Dose Per Fraction: 1.8 Gy
Plan Total Fractions Prescribed: 25
Plan Total Prescribed Dose: 45 Gy
Reference Point Dosage Given to Date: 28.8 Gy
Reference Point Session Dosage Given: 1.8 Gy
Session Number: 16

## 2022-02-26 ENCOUNTER — Ambulatory Visit
Admission: RE | Admit: 2022-02-26 | Discharge: 2022-02-26 | Disposition: A | Discharge status: Home / Self Care | Payer: Medicare Other | Source: Ambulatory Visit | Attending: Radiation Oncology | Admitting: Radiation Oncology

## 2022-02-26 ENCOUNTER — Other Ambulatory Visit: Payer: Self-pay

## 2022-02-26 DIAGNOSIS — C55 Malignant neoplasm of uterus, part unspecified: Secondary | ICD-10-CM | POA: Diagnosis not present

## 2022-02-26 LAB — RAD ONC ARIA SESSION SUMMARY
Course Elapsed Days: 26
Plan Fractions Treated to Date: 17
Plan Prescribed Dose Per Fraction: 1.8 Gy
Plan Total Fractions Prescribed: 25
Plan Total Prescribed Dose: 45 Gy
Reference Point Dosage Given to Date: 30.6 Gy
Reference Point Session Dosage Given: 1.8 Gy
Session Number: 17

## 2022-02-27 ENCOUNTER — Ambulatory Visit
Admission: RE | Admit: 2022-02-27 | Discharge: 2022-02-27 | Disposition: A | Discharge status: Home / Self Care | Payer: Medicare Other | Source: Ambulatory Visit | Attending: Radiation Oncology | Admitting: Radiation Oncology

## 2022-02-27 ENCOUNTER — Other Ambulatory Visit: Payer: Self-pay

## 2022-02-27 ENCOUNTER — Inpatient Hospital Stay: Payer: Medicare Other | Attending: Obstetrics and Gynecology

## 2022-02-27 DIAGNOSIS — C55 Malignant neoplasm of uterus, part unspecified: Secondary | ICD-10-CM | POA: Diagnosis not present

## 2022-02-27 DIAGNOSIS — C549 Malignant neoplasm of corpus uteri, unspecified: Secondary | ICD-10-CM

## 2022-02-27 LAB — RAD ONC ARIA SESSION SUMMARY
Course Elapsed Days: 27
Plan Fractions Treated to Date: 18
Plan Prescribed Dose Per Fraction: 1.8 Gy
Plan Total Fractions Prescribed: 25
Plan Total Prescribed Dose: 45 Gy
Reference Point Dosage Given to Date: 32.4 Gy
Reference Point Session Dosage Given: 1.8 Gy
Session Number: 18

## 2022-02-27 LAB — CBC
HCT: 26.1 % — ABNORMAL LOW (ref 36.0–46.0)
Hemoglobin: 8.5 g/dL — ABNORMAL LOW (ref 12.0–15.0)
MCH: 33.3 pg (ref 26.0–34.0)
MCHC: 32.6 g/dL (ref 30.0–36.0)
MCV: 102.4 fL — ABNORMAL HIGH (ref 80.0–100.0)
Platelets: 66 10*3/uL — ABNORMAL LOW (ref 150–400)
RBC: 2.55 MIL/uL — ABNORMAL LOW (ref 3.87–5.11)
RDW: 19.1 % — ABNORMAL HIGH (ref 11.5–15.5)
WBC: 3.6 10*3/uL — ABNORMAL LOW (ref 4.0–10.5)
nRBC: 0 % (ref 0.0–0.2)

## 2022-02-28 ENCOUNTER — Other Ambulatory Visit: Payer: Self-pay

## 2022-02-28 ENCOUNTER — Ambulatory Visit
Admission: RE | Admit: 2022-02-28 | Discharge: 2022-02-28 | Disposition: A | Discharge status: Home / Self Care | Payer: Medicare Other | Source: Ambulatory Visit | Attending: Radiation Oncology | Admitting: Radiation Oncology

## 2022-02-28 DIAGNOSIS — C55 Malignant neoplasm of uterus, part unspecified: Secondary | ICD-10-CM | POA: Diagnosis not present

## 2022-02-28 LAB — RAD ONC ARIA SESSION SUMMARY
Course Elapsed Days: 28
Plan Fractions Treated to Date: 19
Plan Prescribed Dose Per Fraction: 1.8 Gy
Plan Total Fractions Prescribed: 25
Plan Total Prescribed Dose: 45 Gy
Reference Point Dosage Given to Date: 34.2 Gy
Reference Point Session Dosage Given: 1.8 Gy
Session Number: 19

## 2022-03-01 ENCOUNTER — Other Ambulatory Visit: Payer: Self-pay

## 2022-03-01 ENCOUNTER — Ambulatory Visit
Admission: RE | Admit: 2022-03-01 | Discharge: 2022-03-01 | Disposition: A | Discharge status: Home / Self Care | Payer: Medicare Other | Source: Ambulatory Visit | Attending: Radiation Oncology | Admitting: Radiation Oncology

## 2022-03-01 DIAGNOSIS — C55 Malignant neoplasm of uterus, part unspecified: Secondary | ICD-10-CM | POA: Diagnosis not present

## 2022-03-01 LAB — RAD ONC ARIA SESSION SUMMARY
Course Elapsed Days: 29
Plan Fractions Treated to Date: 20
Plan Prescribed Dose Per Fraction: 1.8 Gy
Plan Total Fractions Prescribed: 25
Plan Total Prescribed Dose: 45 Gy
Reference Point Dosage Given to Date: 36 Gy
Reference Point Session Dosage Given: 1.8 Gy
Session Number: 20

## 2022-03-03 ENCOUNTER — Other Ambulatory Visit: Payer: Self-pay | Admitting: Oncology

## 2022-03-03 NOTE — Progress Notes (Signed)
ON PATHWAY REGIMEN - Uterine  No Change  Continue With Treatment as Ordered.  Original Decision Date/Time: 08/22/2021 17:04     A cycle is every 21 days:     Paclitaxel      Carboplatin   **Always confirm dose/schedule in your pharmacy ordering system**  Patient Characteristics: Carcinosarcoma, Newly Diagnosed, Postoperative (Pathologic Staging), Postoperative Histology: Carcinosarcoma Therapeutic Status: Newly Diagnosed, Postoperative (Pathologic Staging) AJCC M Category: cM0 AJCC 8 Stage Grouping: IIIC1 AJCC T Category: pT3b AJCC N Category: pN1a Intent of Therapy: Curative Intent, Discussed with Patient

## 2022-03-04 ENCOUNTER — Ambulatory Visit
Admission: RE | Admit: 2022-03-04 | Discharge: 2022-03-04 | Disposition: A | Discharge status: Home / Self Care | Payer: Medicare Other | Source: Ambulatory Visit | Attending: Radiation Oncology | Admitting: Radiation Oncology

## 2022-03-04 ENCOUNTER — Other Ambulatory Visit: Payer: Self-pay

## 2022-03-04 DIAGNOSIS — C55 Malignant neoplasm of uterus, part unspecified: Secondary | ICD-10-CM | POA: Diagnosis not present

## 2022-03-04 LAB — RAD ONC ARIA SESSION SUMMARY
Course Elapsed Days: 32
Plan Fractions Treated to Date: 21
Plan Prescribed Dose Per Fraction: 1.8 Gy
Plan Total Fractions Prescribed: 25
Plan Total Prescribed Dose: 45 Gy
Reference Point Dosage Given to Date: 37.8 Gy
Reference Point Session Dosage Given: 1.8 Gy
Session Number: 21

## 2022-03-05 ENCOUNTER — Ambulatory Visit
Admission: RE | Admit: 2022-03-05 | Discharge: 2022-03-05 | Disposition: A | Discharge status: Home / Self Care | Payer: Medicare Other | Source: Ambulatory Visit | Attending: Radiation Oncology | Admitting: Radiation Oncology

## 2022-03-05 ENCOUNTER — Other Ambulatory Visit: Payer: Self-pay

## 2022-03-05 DIAGNOSIS — C55 Malignant neoplasm of uterus, part unspecified: Secondary | ICD-10-CM | POA: Diagnosis not present

## 2022-03-05 LAB — RAD ONC ARIA SESSION SUMMARY
Course Elapsed Days: 33
Plan Fractions Treated to Date: 22
Plan Prescribed Dose Per Fraction: 1.8 Gy
Plan Total Fractions Prescribed: 25
Plan Total Prescribed Dose: 45 Gy
Reference Point Dosage Given to Date: 39.6 Gy
Reference Point Session Dosage Given: 1.8 Gy
Session Number: 22

## 2022-03-06 ENCOUNTER — Other Ambulatory Visit: Payer: Self-pay

## 2022-03-06 ENCOUNTER — Ambulatory Visit
Admission: RE | Admit: 2022-03-06 | Discharge: 2022-03-06 | Disposition: A | Discharge status: Home / Self Care | Payer: Medicare Other | Source: Ambulatory Visit | Attending: Radiation Oncology | Admitting: Radiation Oncology

## 2022-03-06 ENCOUNTER — Inpatient Hospital Stay: Payer: Medicare Other

## 2022-03-06 DIAGNOSIS — C549 Malignant neoplasm of corpus uteri, unspecified: Secondary | ICD-10-CM

## 2022-03-06 DIAGNOSIS — C55 Malignant neoplasm of uterus, part unspecified: Secondary | ICD-10-CM | POA: Diagnosis not present

## 2022-03-06 LAB — RAD ONC ARIA SESSION SUMMARY
Course Elapsed Days: 34
Plan Fractions Treated to Date: 23
Plan Prescribed Dose Per Fraction: 1.8 Gy
Plan Total Fractions Prescribed: 25
Plan Total Prescribed Dose: 45 Gy
Reference Point Dosage Given to Date: 41.4 Gy
Reference Point Session Dosage Given: 1.8 Gy
Session Number: 23

## 2022-03-06 LAB — CBC
HCT: 24.3 % — ABNORMAL LOW (ref 36.0–46.0)
Hemoglobin: 8.1 g/dL — ABNORMAL LOW (ref 12.0–15.0)
MCH: 34 pg (ref 26.0–34.0)
MCHC: 33.3 g/dL (ref 30.0–36.0)
MCV: 102.1 fL — ABNORMAL HIGH (ref 80.0–100.0)
Platelets: 78 10*3/uL — ABNORMAL LOW (ref 150–400)
RBC: 2.38 MIL/uL — ABNORMAL LOW (ref 3.87–5.11)
RDW: 18.8 % — ABNORMAL HIGH (ref 11.5–15.5)
WBC: 3.2 10*3/uL — ABNORMAL LOW (ref 4.0–10.5)
nRBC: 0 % (ref 0.0–0.2)

## 2022-03-07 ENCOUNTER — Ambulatory Visit: Payer: Medicare Other

## 2022-03-07 ENCOUNTER — Ambulatory Visit
Admission: RE | Admit: 2022-03-07 | Discharge: 2022-03-07 | Disposition: A | Discharge status: Home / Self Care | Payer: Medicare Other | Source: Ambulatory Visit | Attending: Radiation Oncology | Admitting: Radiation Oncology

## 2022-03-07 ENCOUNTER — Other Ambulatory Visit: Payer: Self-pay

## 2022-03-07 DIAGNOSIS — C55 Malignant neoplasm of uterus, part unspecified: Secondary | ICD-10-CM | POA: Diagnosis not present

## 2022-03-07 LAB — RAD ONC ARIA SESSION SUMMARY
Course Elapsed Days: 35
Plan Fractions Treated to Date: 24
Plan Prescribed Dose Per Fraction: 1.8 Gy
Plan Total Fractions Prescribed: 25
Plan Total Prescribed Dose: 45 Gy
Reference Point Dosage Given to Date: 43.2 Gy
Reference Point Session Dosage Given: 1.8 Gy
Session Number: 24

## 2022-03-08 ENCOUNTER — Other Ambulatory Visit: Payer: Self-pay

## 2022-03-08 ENCOUNTER — Ambulatory Visit
Admission: RE | Admit: 2022-03-08 | Discharge: 2022-03-08 | Disposition: A | Discharge status: Home / Self Care | Payer: Medicare Other | Source: Ambulatory Visit | Attending: Radiation Oncology | Admitting: Radiation Oncology

## 2022-03-08 DIAGNOSIS — C55 Malignant neoplasm of uterus, part unspecified: Secondary | ICD-10-CM | POA: Diagnosis not present

## 2022-03-08 LAB — RAD ONC ARIA SESSION SUMMARY
Course Elapsed Days: 36
Plan Fractions Treated to Date: 25
Plan Prescribed Dose Per Fraction: 1.8 Gy
Plan Total Fractions Prescribed: 25
Plan Total Prescribed Dose: 45 Gy
Reference Point Dosage Given to Date: 45 Gy
Reference Point Session Dosage Given: 1.8 Gy
Session Number: 25

## 2022-03-11 ENCOUNTER — Ambulatory Visit
Admission: RE | Admit: 2022-03-11 | Discharge: 2022-03-11 | Disposition: A | Discharge status: Home / Self Care | Payer: Medicare Other | Source: Ambulatory Visit | Attending: Radiation Oncology | Admitting: Radiation Oncology

## 2022-03-11 ENCOUNTER — Other Ambulatory Visit: Payer: Self-pay

## 2022-03-11 DIAGNOSIS — C55 Malignant neoplasm of uterus, part unspecified: Secondary | ICD-10-CM | POA: Diagnosis not present

## 2022-03-11 LAB — RAD ONC ARIA SESSION SUMMARY
Course Elapsed Days: 39
Plan Fractions Treated to Date: 1
Plan Prescribed Dose Per Fraction: 4 Gy
Plan Total Fractions Prescribed: 3
Plan Total Prescribed Dose: 12 Gy
Reference Point Dosage Given to Date: 4 Gy
Reference Point Session Dosage Given: 4 Gy
Session Number: 26

## 2022-03-14 ENCOUNTER — Ambulatory Visit
Admission: RE | Admit: 2022-03-14 | Discharge: 2022-03-14 | Disposition: A | Discharge status: Home / Self Care | Payer: Medicare Other | Source: Ambulatory Visit | Attending: Radiation Oncology | Admitting: Radiation Oncology

## 2022-03-14 ENCOUNTER — Other Ambulatory Visit: Payer: Self-pay

## 2022-03-14 DIAGNOSIS — C55 Malignant neoplasm of uterus, part unspecified: Secondary | ICD-10-CM | POA: Diagnosis not present

## 2022-03-14 LAB — RAD ONC ARIA SESSION SUMMARY
Course Elapsed Days: 42
Plan Fractions Treated to Date: 2
Plan Prescribed Dose Per Fraction: 4 Gy
Plan Total Fractions Prescribed: 3
Plan Total Prescribed Dose: 12 Gy
Reference Point Dosage Given to Date: 8 Gy
Reference Point Session Dosage Given: 4 Gy
Session Number: 27

## 2022-03-18 ENCOUNTER — Other Ambulatory Visit: Payer: Self-pay

## 2022-03-18 ENCOUNTER — Ambulatory Visit
Admission: RE | Admit: 2022-03-18 | Discharge: 2022-03-18 | Disposition: A | Discharge status: Home / Self Care | Payer: Medicare Other | Source: Ambulatory Visit | Attending: Radiation Oncology | Admitting: Radiation Oncology

## 2022-03-18 DIAGNOSIS — C55 Malignant neoplasm of uterus, part unspecified: Secondary | ICD-10-CM | POA: Diagnosis not present

## 2022-03-18 LAB — RAD ONC ARIA SESSION SUMMARY
Course Elapsed Days: 46
Plan Fractions Treated to Date: 3
Plan Prescribed Dose Per Fraction: 4 Gy
Plan Total Fractions Prescribed: 3
Plan Total Prescribed Dose: 12 Gy
Reference Point Dosage Given to Date: 12 Gy
Reference Point Session Dosage Given: 4 Gy
Session Number: 28

## 2022-03-27 ENCOUNTER — Ambulatory Visit
Admission: RE | Admit: 2022-03-27 | Discharge: 2022-03-27 | Disposition: A | Discharge status: Home / Self Care | Payer: Medicare Other | Source: Ambulatory Visit | Attending: Family Medicine | Admitting: Family Medicine

## 2022-03-27 DIAGNOSIS — Z780 Asymptomatic menopausal state: Secondary | ICD-10-CM | POA: Diagnosis present

## 2022-03-27 DIAGNOSIS — N6001 Solitary cyst of right breast: Secondary | ICD-10-CM

## 2022-03-27 DIAGNOSIS — N6489 Other specified disorders of breast: Secondary | ICD-10-CM
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
## 2022-04-05 ENCOUNTER — Ambulatory Visit (INDEPENDENT_AMBULATORY_CARE_PROVIDER_SITE_OTHER): Payer: Medicare Other | Admitting: Family Medicine

## 2022-04-05 ENCOUNTER — Encounter: Payer: Self-pay | Admitting: Family Medicine

## 2022-04-05 VITALS — BP 117/68 | HR 97 | Temp 98.2°F | Resp 16 | Ht 67.5 in | Wt 253.3 lb

## 2022-04-05 DIAGNOSIS — M05742 Rheumatoid arthritis with rheumatoid factor of left hand without organ or systems involvement: Secondary | ICD-10-CM

## 2022-04-05 DIAGNOSIS — L4050 Arthropathic psoriasis, unspecified: Secondary | ICD-10-CM | POA: Diagnosis not present

## 2022-04-05 DIAGNOSIS — E559 Vitamin D deficiency, unspecified: Secondary | ICD-10-CM

## 2022-04-05 DIAGNOSIS — M05741 Rheumatoid arthritis with rheumatoid factor of right hand without organ or systems involvement: Secondary | ICD-10-CM

## 2022-04-05 DIAGNOSIS — R809 Proteinuria, unspecified: Secondary | ICD-10-CM

## 2022-04-05 DIAGNOSIS — Z23 Encounter for immunization: Secondary | ICD-10-CM

## 2022-04-05 DIAGNOSIS — E1129 Type 2 diabetes mellitus with other diabetic kidney complication: Secondary | ICD-10-CM

## 2022-04-05 DIAGNOSIS — Q752 Hypertelorism: Secondary | ICD-10-CM

## 2022-04-05 DIAGNOSIS — E785 Hyperlipidemia, unspecified: Secondary | ICD-10-CM

## 2022-04-05 DIAGNOSIS — E890 Postprocedural hypothyroidism: Secondary | ICD-10-CM

## 2022-04-05 DIAGNOSIS — E1169 Type 2 diabetes mellitus with other specified complication: Secondary | ICD-10-CM | POA: Diagnosis not present

## 2022-04-05 DIAGNOSIS — I10 Essential (primary) hypertension: Secondary | ICD-10-CM

## 2022-04-05 LAB — POCT GLYCOSYLATED HEMOGLOBIN (HGB A1C): Hemoglobin A1C: 5.3 % (ref 4.0–5.6)

## 2022-04-05 MED ORDER — VALSARTAN-HYDROCHLOROTHIAZIDE 80-12.5 MG PO TABS: 1.0000 | ORAL_TABLET | Freq: Every day | ORAL | 0 refills | Status: DC

## 2022-04-05 MED ORDER — LEVOTHYROXINE SODIUM 175 MCG PO TABS: 175.0000 ug | ORAL_TABLET | Freq: Every day | ORAL | 1 refills | Status: DC

## 2022-04-05 MED ORDER — ROSUVASTATIN CALCIUM 5 MG PO TABS: 5.0000 mg | ORAL_TABLET | Freq: Every morning | ORAL | 1 refills | Status: DC

## 2022-04-06 ENCOUNTER — Encounter: Payer: Self-pay | Admitting: Oncology

## 2022-04-06 LAB — COMPLETE METABOLIC PANEL WITH GFR
AG Ratio: 1.2 (calc) (ref 1.0–2.5)
ALT: 26 U/L (ref 6–29)
AST: 27 U/L (ref 10–35)
Albumin: 3.9 g/dL (ref 3.6–5.1)
Alkaline phosphatase (APISO): 93 U/L (ref 37–153)
BUN: 21 mg/dL (ref 7–25)
CO2: 25 mmol/L (ref 20–32)
Calcium: 9.7 mg/dL (ref 8.6–10.4)
Chloride: 107 mmol/L (ref 98–110)
Creat: 0.95 mg/dL (ref 0.50–1.05)
Globulin: 3.3 g/dL (calc) (ref 1.9–3.7)
Glucose, Bld: 102 mg/dL — ABNORMAL HIGH (ref 65–99)
Potassium: 4 mmol/L (ref 3.5–5.3)
Sodium: 140 mmol/L (ref 135–146)
Total Bilirubin: 0.4 mg/dL (ref 0.2–1.2)
Total Protein: 7.2 g/dL (ref 6.1–8.1)
eGFR: 65 mL/min/{1.73_m2} (ref >=60)

## 2022-04-06 LAB — TSH: TSH: 0.26 mIU/L — ABNORMAL LOW (ref 0.40–4.50)

## 2022-04-07 ENCOUNTER — Other Ambulatory Visit: Payer: Self-pay | Admitting: Family Medicine

## 2022-04-07 DIAGNOSIS — E890 Postprocedural hypothyroidism: Secondary | ICD-10-CM

## 2022-04-10 ENCOUNTER — Telehealth: Payer: Self-pay

## 2022-04-10 DIAGNOSIS — C55 Malignant neoplasm of uterus, part unspecified: Secondary | ICD-10-CM

## 2022-04-10 NOTE — Telephone Encounter (Signed)
-----   Message from Earlie Server, MD sent at 04/10/2022  8:35 AM EDT ----- She has finished radiation. Please schedule her to follow up with me  in early Nov. Lab MD cbc cmp

## 2022-04-10 NOTE — Telephone Encounter (Signed)
Please schedule lab/MD in early Nov and inform pt of appt.

## 2022-04-22 ENCOUNTER — Ambulatory Visit: Payer: Medicare Other

## 2022-04-22 ENCOUNTER — Ambulatory Visit
Admission: RE | Admit: 2022-04-22 | Discharge: 2022-04-22 | Disposition: A | Discharge status: Home / Self Care | Payer: Medicare Other | Source: Ambulatory Visit | Attending: Obstetrics and Gynecology | Admitting: Obstetrics and Gynecology

## 2022-04-22 ENCOUNTER — Encounter: Payer: Self-pay | Admitting: Radiation Oncology

## 2022-04-22 VITALS — BP 122/66 | HR 97 | Temp 96.0°F | Resp 16 | Ht 67.5 in | Wt 250.9 lb

## 2022-04-22 DIAGNOSIS — C549 Malignant neoplasm of corpus uteri, unspecified: Secondary | ICD-10-CM | POA: Insufficient documentation

## 2022-04-22 DIAGNOSIS — I10 Essential (primary) hypertension: Secondary | ICD-10-CM

## 2022-04-22 NOTE — Progress Notes (Signed)
Pt here for blood pressure check since starting valsartan.  Lowered dose of blood pressure med due to being low and losing weight.  Pt denies any side effects to new med.  Blood pressure today is 108/60.Pt told to cut valsartan in half and f/u in 2 weeks

## 2022-04-22 NOTE — Progress Notes (Signed)
Radiation Oncology Follow up Note  Name: Taylor Perry   Date:   04/22/2022 MRN:  [id number] DOB: Nov 24, 1950    This 71 y.o. female presents to the clinic today for 1 month follow-up status post chemotherapy as well as external beam radiation and vaginal brachytherapy for.  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  She specifically denies any increased lower urinary tract symptoms diarrhea or fatigue..  COMPLICATIONS OF TREATMENT: none  FOLLOW UP COMPLIANCE: keeps appointments   PHYSICAL EXAM:  BP 122/66   Pulse 97   Temp (!) 96 F (35.6 C)   Resp 16   Ht 5' 7.5" (1.715 m)   Wt 250 lb 14.4 oz (113.8 kg)   BMI 38.72 kg/m  Well-developed well-nourished patient in NAD. HEENT reveals PERLA, EOMI, discs not visualized.  Oral cavity is clear. No oral mucosal lesions are identified. Neck is clear without evidence of cervical or supraclavicular adenopathy. Lungs are clear to A&P. Cardiac examination is essentially unremarkable with regular rate and rhythm without murmur rub or thrill. Abdomen is benign with no organomegaly or masses noted. Motor sensory and DTR levels are equal and symmetric in the upper and lower extremities. Cranial nerves II through XII are grossly intact. Proprioception is intact. No peripheral adenopathy or edema is identified. No motor or sensory levels are noted. Crude visual fields are within normal range.  RADIOLOGY RESULTS: CT scan chest abdomen pelvis ordered  PLAN: Present time patient is doing well with a low side effect profile.  Have gone ahead and ordered a chest abdomen pelvis CT scan prior to her next visit with GYN  oncology in December.  I have asked to see her back in 4 to 5 months.  She can is continues follow-up care with medical oncology.  Patient is to call with any concerns.  I would like to take this opportunity to thank you for allowing me to participate in the care of your patient.Noreene Filbert, MD

## 2022-04-23 ENCOUNTER — Ambulatory Visit: Payer: Medicare Other

## 2022-04-30 ENCOUNTER — Other Ambulatory Visit: Payer: Self-pay | Admitting: Family Medicine

## 2022-04-30 ENCOUNTER — Encounter: Payer: Self-pay | Admitting: Oncology

## 2022-04-30 ENCOUNTER — Inpatient Hospital Stay: Payer: Medicare Other | Attending: Oncology

## 2022-04-30 ENCOUNTER — Inpatient Hospital Stay (HOSPITAL_BASED_OUTPATIENT_CLINIC_OR_DEPARTMENT_OTHER): Payer: Medicare Other | Admitting: Oncology

## 2022-04-30 VITALS — BP 117/68 | HR 105 | Temp 97.6°F | Resp 18 | Wt 250.9 lb

## 2022-04-30 DIAGNOSIS — Z90722 Acquired absence of ovaries, bilateral: Secondary | ICD-10-CM | POA: Insufficient documentation

## 2022-04-30 DIAGNOSIS — Z90710 Acquired absence of both cervix and uterus: Secondary | ICD-10-CM | POA: Insufficient documentation

## 2022-04-30 DIAGNOSIS — Z808 Family history of malignant neoplasm of other organs or systems: Secondary | ICD-10-CM | POA: Insufficient documentation

## 2022-04-30 DIAGNOSIS — L4050 Arthropathic psoriasis, unspecified: Secondary | ICD-10-CM | POA: Insufficient documentation

## 2022-04-30 DIAGNOSIS — E876 Hypokalemia: Secondary | ICD-10-CM

## 2022-04-30 DIAGNOSIS — C55 Malignant neoplasm of uterus, part unspecified: Secondary | ICD-10-CM | POA: Diagnosis present

## 2022-04-30 DIAGNOSIS — D6481 Anemia due to antineoplastic chemotherapy: Secondary | ICD-10-CM | POA: Insufficient documentation

## 2022-04-30 DIAGNOSIS — D649 Anemia, unspecified: Secondary | ICD-10-CM | POA: Diagnosis not present

## 2022-04-30 DIAGNOSIS — I10 Essential (primary) hypertension: Secondary | ICD-10-CM | POA: Insufficient documentation

## 2022-04-30 DIAGNOSIS — E1140 Type 2 diabetes mellitus with diabetic neuropathy, unspecified: Secondary | ICD-10-CM | POA: Insufficient documentation

## 2022-04-30 DIAGNOSIS — Z923 Personal history of irradiation: Secondary | ICD-10-CM | POA: Diagnosis not present

## 2022-04-30 LAB — COMPREHENSIVE METABOLIC PANEL
ALT: 25 U/L (ref 0–44)
AST: 38 U/L (ref 15–41)
Albumin: 3.8 g/dL (ref 3.5–5.0)
Alkaline Phosphatase: 80 U/L (ref 38–126)
Anion gap: 10 (ref 5–15)
BUN: 18 mg/dL (ref 8–23)
CO2: 24 mmol/L (ref 22–32)
Calcium: 8.9 mg/dL (ref 8.9–10.3)
Chloride: 104 mmol/L (ref 98–111)
Creatinine, Ser: 1.06 mg/dL — ABNORMAL HIGH (ref 0.44–1.00)
GFR, Estimated: 57 mL/min — ABNORMAL LOW (ref >60)
Glucose, Bld: 129 mg/dL — ABNORMAL HIGH (ref 70–99)
Potassium: 3.4 mmol/L — ABNORMAL LOW (ref 3.5–5.1)
Sodium: 138 mmol/L (ref 135–145)
Total Bilirubin: 0.5 mg/dL (ref 0.3–1.2)
Total Protein: 7.5 g/dL (ref 6.5–8.1)

## 2022-04-30 LAB — CBC WITH DIFFERENTIAL/PLATELET
Abs Immature Granulocytes: 0.03 10*3/uL (ref 0.00–0.07)
Basophils Absolute: 0 10*3/uL (ref 0.0–0.1)
Basophils Relative: 0 %
Eosinophils Absolute: 0.1 10*3/uL (ref 0.0–0.5)
Eosinophils Relative: 2 %
HCT: 26 % — ABNORMAL LOW (ref 36.0–46.0)
Hemoglobin: 8.4 g/dL — ABNORMAL LOW (ref 12.0–15.0)
Immature Granulocytes: 1 %
Lymphocytes Relative: 10 %
Lymphs Abs: 0.3 10*3/uL — ABNORMAL LOW (ref 0.7–4.0)
MCH: 32.9 pg (ref 26.0–34.0)
MCHC: 32.3 g/dL (ref 30.0–36.0)
MCV: 102 fL — ABNORMAL HIGH (ref 80.0–100.0)
Monocytes Absolute: 0.4 10*3/uL (ref 0.1–1.0)
Monocytes Relative: 12 %
Neutro Abs: 2.4 10*3/uL (ref 1.7–7.7)
Neutrophils Relative %: 75 %
Platelets: 94 10*3/uL — ABNORMAL LOW (ref 150–400)
RBC: 2.55 MIL/uL — ABNORMAL LOW (ref 3.87–5.11)
RDW: 14.6 % (ref 11.5–15.5)
WBC: 3.2 10*3/uL — ABNORMAL LOW (ref 4.0–10.5)
nRBC: 0 % (ref 0.0–0.2)

## 2022-04-30 NOTE — Assessment & Plan Note (Signed)
#  Carcinosarcoma of uterus, stage IIIc, left SLN macrometastasis and positive vaginal margin.   Case was discussed with gynecology oncology.-Recommend adjuvant chemotherapy, carboplatin/Taxol for 6 cycles followed by external radiation and VBT. Labs reviewed and discussed once with patient. S/p 6 cycles of carboplatin/Taxol, s/p Radiation Dr.Chrystal has ordered repeat CT.

## 2022-04-30 NOTE — Assessment & Plan Note (Signed)
Secondary to previous chemotherapy. Hemoglobin has improved although has not recovered to her baseline as expected. Recommend patient to start empiric vitamin B12 supplementation and folic acid supplementation. Check vitamin B12 and folate level at the next visit. Iron panel was previously checked and no sign of iron deficiency.

## 2022-04-30 NOTE — Assessment & Plan Note (Signed)
Mild hypokalemia.  Recommend patient to take potassium rich food
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
## 2022-04-30 NOTE — Assessment & Plan Note (Signed)
Rheumatoid arthritis/Psoriatic arthritis  previously on Humira which is currently on hold. Continue follow-up with rheumatology

## 2022-05-06 ENCOUNTER — Ambulatory Visit: Payer: Medicare Other

## 2022-05-06 VITALS — BP 104/66

## 2022-05-06 DIAGNOSIS — I10 Essential (primary) hypertension: Secondary | ICD-10-CM

## 2022-05-06 MED ORDER — LOSARTAN POTASSIUM 25 MG PO TABS: 25.0000 mg | ORAL_TABLET | Freq: Every day | ORAL | 0 refills | Status: DC

## 2022-05-06 NOTE — Progress Notes (Signed)
Since cutting valsartan in half blood pressure still running low at 104/66 rechecked by shannon and is 102/66.  Dr. Ancil Boozer consulted and will tell patient to stop and start on losartan '25mg'$  qd.  Patient notified
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
## 2022-05-24 ENCOUNTER — Encounter: Payer: Self-pay | Admitting: Family Medicine

## 2022-05-24 ENCOUNTER — Ambulatory Visit (INDEPENDENT_AMBULATORY_CARE_PROVIDER_SITE_OTHER): Payer: Medicare Other | Admitting: Family Medicine

## 2022-05-24 VITALS — BP 108/64 | HR 93 | Temp 98.2°F | Resp 18 | Ht 67.5 in | Wt 252.4 lb

## 2022-05-24 DIAGNOSIS — E876 Hypokalemia: Secondary | ICD-10-CM | POA: Diagnosis not present

## 2022-05-24 DIAGNOSIS — R031 Nonspecific low blood-pressure reading: Secondary | ICD-10-CM | POA: Diagnosis not present

## 2022-05-24 DIAGNOSIS — Z8679 Personal history of other diseases of the circulatory system: Secondary | ICD-10-CM | POA: Diagnosis not present

## 2022-05-24 NOTE — Patient Instructions (Signed)
Potassium Content of Foods  Potassium is a mineral found in many foods and drinks. It can affect how the heart works, affect blood pressure, and keep fluids and electrolytes balanced in the body. It is important not to have too much potassium (hyperkalemia) or too little potassium (hypokalemia) in the body, especially in the blood. Potassium is naturally found in many different types of whole foods, such as fruits, vegetables, meat, and dairy products. Processed foods tend to be lower in potassium. The amount of potassium you need each day depends on your age and any medical conditions you may have. General recommendations are: Females aged 19 and older: 2,600 mg per day. Males aged 19 and older: 3,400 mg per day. Talk with your health care provider or dietitian about how much potassium you need. What foods are high in potassium? Below are examples of foods that have greater than 200 mg of potassium per serving. Fruits Orange -- 1 medium (130 g) has 230 mg of potassium. Banana -- 1 medium (120 g) has 420 mg of potassium. Cantaloupe, chunks -- 1 cup (160 g) has 430 mg of potassium. Vegetables Potato, baked, without skin -- 1 medium (170 g) has 600 mg of potassium. Broccoli, chopped, cooked --  cup (77.5 g) has 230 mg of potassium. Tomato, chopped or sliced -- 1 cup (152 g) has 400 mg of potassium. Grains Cereal, bran with raisins -- 1 cup (59 g) has 360 mg of potassium. Granola with almonds --  cup (82 g) has 220 mg of potassium. Meats and other proteins Ground beef patty -- 4 ounces (113 g) has 240 mg of potassium. Kidney beans, boiled --  cup (130 g) has 350 mg of potassium. Almonds -- 1 ounce (approximately 22 nuts or 28 g) has 200 mg of potassium. Dairy Cow's milk, 1% -- 1 cup (237 mL) has 360 mg of potassium. Plain vanilla low-fat yogurt --  cup (184 g) has 220 mg of potassium. The items listed above may not be a complete list of foods high in potassium. Actual amounts of potassium  may be different depending on ripeness, shelf life, and food preparation. Contact a dietitian for more information. What foods are low in potassium? Below are examples of foods that have less than 200 mg of potassium per serving. Fruits Blueberries -- 1 cup (145 g) has 110 mg of potassium. Apple -- 1 medium (140 g) has 145 mg of potassium. Grapes -- 1 cup (160 g) has 175 mg of potassium. Vegetables Cabbage, raw -- 1 cup (70 g) has 120 mg of potassium. Cauliflower, chopped, cooked -- 1 cup (180 g) has 90 mg of potassium. Romaine lettuce, chopped -- 1 cup (56 g) has 120 mg of potassium. Grains Bagel, plain -- one 4-inch (10 cm) has 100 mg of potassium. Whole wheat bread -- 1 slice (26 g) has 70 mg of potassium. White rice, cooked -- 1 cup (163 g) has 50 mg of potassium. Meats and other proteins Tuna, light, canned in water -- 3 ounces (85 g) has 150 mg of potassium. Egg, fried -- 1 large (50 g) has 60 mg of potassium. Peanuts --1 ounce (35 nuts or 28 g) has 180 mg of potassium. Tofu --  cup (252 g) has 150 mg of potassium. Dairy Cheese (cheddar, colby, mozzarella, or provolone) -- 1 ounce (28 g) has 30 to 40 mg of potassium. The items listed above may not be a complete list of foods that are low in potassium. Actual amounts of   potassium may be different depending on ripeness, shelf life, and food preparation. Contact a dietitian for more information. Summary Potassium is a mineral found in many foods and drinks. It affects how the heart works, affects blood pressure, and keeps fluids and electrolytes balanced in the body. The amount of potassium you need each day depends on your age and any existing medical conditions you may have. Your health care provider or dietitian may recommend an amount of potassium that you should have each day. This information is not intended to replace advice given to you by your health care provider. Make sure you discuss any questions you have with your health  care provider. Document Revised: 03/13/2021 Document Reviewed: 02/22/2021 Elsevier Patient Education  2023 [employer] Inc[employer]  

## 2022-05-30 ENCOUNTER — Other Ambulatory Visit: Payer: Self-pay | Admitting: Family Medicine

## 2022-06-06 ENCOUNTER — Other Ambulatory Visit: Payer: Self-pay | Admitting: Family Medicine

## 2022-06-06 DIAGNOSIS — E1129 Type 2 diabetes mellitus with other diabetic kidney complication: Secondary | ICD-10-CM

## 2022-06-07 ENCOUNTER — Ambulatory Visit
Admission: RE | Admit: 2022-06-07 | Discharge: 2022-06-07 | Disposition: A | Discharge status: Home / Self Care | Payer: Medicare Other | Source: Ambulatory Visit | Attending: Radiation Oncology | Admitting: Radiation Oncology

## 2022-06-07 DIAGNOSIS — C549 Malignant neoplasm of corpus uteri, unspecified: Secondary | ICD-10-CM | POA: Insufficient documentation

## 2022-06-07 MED ORDER — IOHEXOL 300 MG/ML  SOLN
100.0000 mL | Freq: Once | INTRAMUSCULAR | Status: AC | PRN
  Administered 2022-06-07: 100 mL via INTRAVENOUS

## 2022-06-12 ENCOUNTER — Inpatient Hospital Stay: Payer: Medicare Other | Attending: Oncology | Admitting: Obstetrics and Gynecology

## 2022-06-12 VITALS — BP 140/79 | HR 100 | Temp 97.4°F | Wt 252.0 lb

## 2022-06-12 DIAGNOSIS — C55 Malignant neoplasm of uterus, part unspecified: Secondary | ICD-10-CM

## 2022-06-12 DIAGNOSIS — Z9221 Personal history of antineoplastic chemotherapy: Secondary | ICD-10-CM | POA: Insufficient documentation

## 2022-06-12 DIAGNOSIS — Z90722 Acquired absence of ovaries, bilateral: Secondary | ICD-10-CM | POA: Insufficient documentation

## 2022-06-12 DIAGNOSIS — Z90710 Acquired absence of both cervix and uterus: Secondary | ICD-10-CM | POA: Diagnosis not present

## 2022-06-12 DIAGNOSIS — C541 Malignant neoplasm of endometrium: Secondary | ICD-10-CM | POA: Diagnosis present

## 2022-06-12 DIAGNOSIS — Z923 Personal history of irradiation: Secondary | ICD-10-CM | POA: Insufficient documentation

## 2022-06-12 NOTE — Progress Notes (Signed)
Survivorship Care Plan visit completed.  Treatment summary reviewed and given to patient.  ASCO answers booklet reviewed and given to patient.  CARE program and Cancer Transitions discussed with patient along with other resources cancer center offers to patients and caregivers.  Patient verbalized understanding.    
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
## 2022-06-13 ENCOUNTER — Other Ambulatory Visit: Payer: Self-pay | Admitting: Family Medicine

## 2022-06-13 DIAGNOSIS — E890 Postprocedural hypothyroidism: Secondary | ICD-10-CM

## 2022-06-29 ENCOUNTER — Other Ambulatory Visit: Payer: Self-pay | Admitting: Family Medicine

## 2022-06-29 DIAGNOSIS — E890 Postprocedural hypothyroidism: Secondary | ICD-10-CM

## 2022-07-05 ENCOUNTER — Other Ambulatory Visit: Payer: Self-pay | Admitting: Family Medicine

## 2022-07-05 DIAGNOSIS — E890 Postprocedural hypothyroidism: Secondary | ICD-10-CM

## 2022-07-07 ENCOUNTER — Encounter: Payer: Self-pay | Admitting: Oncology

## 2022-07-12 ENCOUNTER — Other Ambulatory Visit: Payer: Self-pay | Admitting: Family Medicine

## 2022-07-12 DIAGNOSIS — E1169 Type 2 diabetes mellitus with other specified complication: Secondary | ICD-10-CM

## 2022-07-16 DIAGNOSIS — M059 Rheumatoid arthritis with rheumatoid factor, unspecified: Secondary | ICD-10-CM | POA: Diagnosis not present

## 2022-07-16 DIAGNOSIS — L409 Psoriasis, unspecified: Secondary | ICD-10-CM | POA: Diagnosis not present

## 2022-07-16 DIAGNOSIS — L4050 Arthropathic psoriasis, unspecified: Secondary | ICD-10-CM | POA: Diagnosis not present

## 2022-07-30 ENCOUNTER — Encounter: Payer: Self-pay | Admitting: Oncology

## 2022-07-31 ENCOUNTER — Inpatient Hospital Stay: Payer: 59 | Attending: Oncology

## 2022-07-31 ENCOUNTER — Inpatient Hospital Stay (HOSPITAL_BASED_OUTPATIENT_CLINIC_OR_DEPARTMENT_OTHER): Payer: 59 | Admitting: Oncology

## 2022-07-31 ENCOUNTER — Encounter: Payer: Self-pay | Admitting: Oncology

## 2022-07-31 VITALS — BP 129/81 | HR 98 | Temp 96.0°F | Resp 16 | Wt 251.0 lb

## 2022-07-31 DIAGNOSIS — G620 Drug-induced polyneuropathy: Secondary | ICD-10-CM | POA: Insufficient documentation

## 2022-07-31 DIAGNOSIS — D649 Anemia, unspecified: Secondary | ICD-10-CM | POA: Diagnosis not present

## 2022-07-31 DIAGNOSIS — D559 Anemia due to enzyme disorder, unspecified: Secondary | ICD-10-CM | POA: Insufficient documentation

## 2022-07-31 DIAGNOSIS — C55 Malignant neoplasm of uterus, part unspecified: Secondary | ICD-10-CM

## 2022-07-31 DIAGNOSIS — M069 Rheumatoid arthritis, unspecified: Secondary | ICD-10-CM | POA: Diagnosis not present

## 2022-07-31 DIAGNOSIS — I10 Essential (primary) hypertension: Secondary | ICD-10-CM | POA: Diagnosis not present

## 2022-07-31 DIAGNOSIS — Z79890 Hormone replacement therapy (postmenopausal): Secondary | ICD-10-CM | POA: Diagnosis not present

## 2022-07-31 DIAGNOSIS — E669 Obesity, unspecified: Secondary | ICD-10-CM | POA: Diagnosis not present

## 2022-07-31 DIAGNOSIS — T451X5A Adverse effect of antineoplastic and immunosuppressive drugs, initial encounter: Secondary | ICD-10-CM | POA: Insufficient documentation

## 2022-07-31 DIAGNOSIS — K449 Diaphragmatic hernia without obstruction or gangrene: Secondary | ICD-10-CM | POA: Insufficient documentation

## 2022-07-31 DIAGNOSIS — E039 Hypothyroidism, unspecified: Secondary | ICD-10-CM | POA: Diagnosis not present

## 2022-07-31 DIAGNOSIS — C541 Malignant neoplasm of endometrium: Secondary | ICD-10-CM | POA: Diagnosis present

## 2022-07-31 DIAGNOSIS — L4050 Arthropathic psoriasis, unspecified: Secondary | ICD-10-CM | POA: Diagnosis not present

## 2022-07-31 DIAGNOSIS — E119 Type 2 diabetes mellitus without complications: Secondary | ICD-10-CM | POA: Insufficient documentation

## 2022-07-31 DIAGNOSIS — Z803 Family history of malignant neoplasm of breast: Secondary | ICD-10-CM | POA: Diagnosis not present

## 2022-07-31 DIAGNOSIS — Z800 Family history of malignant neoplasm of digestive organs: Secondary | ICD-10-CM | POA: Diagnosis not present

## 2022-07-31 DIAGNOSIS — Z79899 Other long term (current) drug therapy: Secondary | ICD-10-CM | POA: Insufficient documentation

## 2022-07-31 DIAGNOSIS — E785 Hyperlipidemia, unspecified: Secondary | ICD-10-CM | POA: Diagnosis not present

## 2022-07-31 DIAGNOSIS — K8020 Calculus of gallbladder without cholecystitis without obstruction: Secondary | ICD-10-CM | POA: Diagnosis not present

## 2022-07-31 LAB — IRON AND TIBC
Iron: 65 ug/dL (ref 28–170)
Saturation Ratios: 26 % (ref 10.4–31.8)
TIBC: 252 ug/dL (ref 250–450)
UIBC: 187 ug/dL

## 2022-07-31 LAB — COMPREHENSIVE METABOLIC PANEL
ALT: 23 U/L (ref 0–44)
AST: 30 U/L (ref 15–41)
Albumin: 3.7 g/dL (ref 3.5–5.0)
Alkaline Phosphatase: 81 U/L (ref 38–126)
Anion gap: 6 (ref 5–15)
BUN: 18 mg/dL (ref 8–23)
CO2: 25 mmol/L (ref 22–32)
Calcium: 9.2 mg/dL (ref 8.9–10.3)
Chloride: 107 mmol/L (ref 98–111)
Creatinine, Ser: 0.87 mg/dL (ref 0.44–1.00)
GFR, Estimated: >60 mL/min (ref >60)
Glucose, Bld: 106 mg/dL — ABNORMAL HIGH (ref 70–99)
Potassium: 3.5 mmol/L (ref 3.5–5.1)
Sodium: 138 mmol/L (ref 135–145)
Total Bilirubin: 0.3 mg/dL (ref 0.3–1.2)
Total Protein: 7.2 g/dL (ref 6.5–8.1)

## 2022-07-31 LAB — RETIC PANEL
Immature Retic Fract: 21.8 % — ABNORMAL HIGH (ref 2.3–15.9)
RBC.: 2.99 MIL/uL — ABNORMAL LOW (ref 3.87–5.11)
Retic Count, Absolute: 59.8 10*3/uL (ref 19.0–186.0)
Retic Ct Pct: 2 % (ref 0.4–3.1)
Reticulocyte Hemoglobin: 28.8 pg (ref >27.9)

## 2022-07-31 LAB — CBC WITH DIFFERENTIAL/PLATELET
Abs Immature Granulocytes: 0.04 10*3/uL (ref 0.00–0.07)
Basophils Absolute: 0 10*3/uL (ref 0.0–0.1)
Basophils Relative: 0 %
Eosinophils Absolute: 0 10*3/uL (ref 0.0–0.5)
Eosinophils Relative: 1 %
HCT: 27.9 % — ABNORMAL LOW (ref 36.0–46.0)
Hemoglobin: 8.8 g/dL — ABNORMAL LOW (ref 12.0–15.0)
Immature Granulocytes: 1 %
Lymphocytes Relative: 14 %
Lymphs Abs: 0.4 10*3/uL — ABNORMAL LOW (ref 0.7–4.0)
MCH: 29.9 pg (ref 26.0–34.0)
MCHC: 31.5 g/dL (ref 30.0–36.0)
MCV: 94.9 fL (ref 80.0–100.0)
Monocytes Absolute: 0.4 10*3/uL (ref 0.1–1.0)
Monocytes Relative: 13 %
Neutro Abs: 2 10*3/uL (ref 1.7–7.7)
Neutrophils Relative %: 71 %
Platelets: 119 10*3/uL — ABNORMAL LOW (ref 150–400)
RBC: 2.94 MIL/uL — ABNORMAL LOW (ref 3.87–5.11)
RDW: 15.5 % (ref 11.5–15.5)
WBC: 2.9 10*3/uL — ABNORMAL LOW (ref 4.0–10.5)
nRBC: 0 % (ref 0.0–0.2)

## 2022-07-31 LAB — FERRITIN: Ferritin: 434 ng/mL — ABNORMAL HIGH (ref 11–307)

## 2022-07-31 LAB — FOLATE: Folate: >40 ng/mL (ref >5.9)

## 2022-07-31 LAB — VITAMIN B12: Vitamin B-12: 707 pg/mL (ref 180–914)

## 2022-07-31 NOTE — Assessment & Plan Note (Signed)
Hemoglobin has mildly improved although has not recovered to her baseline as expected. Workup showed elevated ferritin and iron saturation, adequate B12 and folate. Suspect anemia secondary to pre-existing bone marrow disorders/ineffective erythropoiesis, anemia secondary to chronic disease. Close monitor.

## 2022-07-31 NOTE — Assessment & Plan Note (Addendum)
#  Carcinosarcoma of uterus, stage IIIc, left SLN macrometastasis and positive vaginal margin.   Case was discussed with gynecology oncology.-Recommend adjuvant chemotherapy, carboplatin/Taxol for 6 cycles followed by external radiation and VBT. Labs reviewed and discussed once with patient. S/p 6 cycles of carboplatin/Taxol, s/p Radiation Repeat CT in March 2024
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
## 2022-07-31 NOTE — Assessment & Plan Note (Signed)
Rheumatoid arthritis/Psoriatic arthritis  previously on Humira which is currently on hold. Continue follow-up with rheumatology

## 2022-08-06 ENCOUNTER — Ambulatory Visit (INDEPENDENT_AMBULATORY_CARE_PROVIDER_SITE_OTHER): Payer: 59 | Admitting: Family Medicine

## 2022-08-06 ENCOUNTER — Encounter: Payer: Self-pay | Admitting: Family Medicine

## 2022-08-06 VITALS — BP 122/72 | HR 90 | Resp 16 | Ht 67.0 in | Wt 248.0 lb

## 2022-08-06 DIAGNOSIS — I10 Essential (primary) hypertension: Secondary | ICD-10-CM

## 2022-08-06 DIAGNOSIS — E559 Vitamin D deficiency, unspecified: Secondary | ICD-10-CM

## 2022-08-06 DIAGNOSIS — E1169 Type 2 diabetes mellitus with other specified complication: Secondary | ICD-10-CM | POA: Diagnosis not present

## 2022-08-06 DIAGNOSIS — R809 Proteinuria, unspecified: Secondary | ICD-10-CM | POA: Diagnosis not present

## 2022-08-06 DIAGNOSIS — D61818 Other pancytopenia: Secondary | ICD-10-CM

## 2022-08-06 DIAGNOSIS — E1129 Type 2 diabetes mellitus with other diabetic kidney complication: Secondary | ICD-10-CM | POA: Diagnosis not present

## 2022-08-06 DIAGNOSIS — Z8542 Personal history of malignant neoplasm of other parts of uterus: Secondary | ICD-10-CM

## 2022-08-06 DIAGNOSIS — E890 Postprocedural hypothyroidism: Secondary | ICD-10-CM | POA: Diagnosis not present

## 2022-08-06 DIAGNOSIS — L4050 Arthropathic psoriasis, unspecified: Secondary | ICD-10-CM | POA: Diagnosis not present

## 2022-08-06 DIAGNOSIS — M05741 Rheumatoid arthritis with rheumatoid factor of right hand without organ or systems involvement: Secondary | ICD-10-CM

## 2022-08-06 DIAGNOSIS — E441 Mild protein-calorie malnutrition: Secondary | ICD-10-CM | POA: Diagnosis not present

## 2022-08-06 DIAGNOSIS — E785 Hyperlipidemia, unspecified: Secondary | ICD-10-CM | POA: Insufficient documentation

## 2022-08-06 DIAGNOSIS — Q752 Hypertelorism: Secondary | ICD-10-CM | POA: Diagnosis not present

## 2022-08-06 DIAGNOSIS — M05742 Rheumatoid arthritis with rheumatoid factor of left hand without organ or systems involvement: Secondary | ICD-10-CM

## 2022-08-06 DIAGNOSIS — J3089 Other allergic rhinitis: Secondary | ICD-10-CM

## 2022-08-06 LAB — POCT GLYCOSYLATED HEMOGLOBIN (HGB A1C): Hemoglobin A1C: 5.7 % — AB (ref 4.0–5.6)

## 2022-08-06 MED ORDER — ROSUVASTATIN CALCIUM 5 MG PO TABS: 5.0000 mg | ORAL_TABLET | Freq: Every morning | ORAL | 1 refills | Status: DC

## 2022-08-06 MED ORDER — LEVOTHYROXINE SODIUM 175 MCG PO TABS: 175.0000 ug | ORAL_TABLET | Freq: Every day | ORAL | 0 refills | Status: DC

## 2022-08-06 MED ORDER — FLUTICASONE PROPIONATE 50 MCG/ACT NA SUSP: 1.0000 | Freq: Every day | NASAL | 1 refills | Status: DC | PRN

## 2022-08-06 NOTE — Patient Instructions (Signed)
Ask pharmacist for Tdap and RSV
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
## 2022-08-07 ENCOUNTER — Other Ambulatory Visit: Payer: Self-pay

## 2022-08-12 ENCOUNTER — Other Ambulatory Visit: Payer: Self-pay

## 2022-08-12 DIAGNOSIS — J3089 Other allergic rhinitis: Secondary | ICD-10-CM

## 2022-08-12 MED ORDER — FLUTICASONE PROPIONATE 50 MCG/ACT NA SUSP: 2.0000 | Freq: Every day | NASAL | 1 refills | Status: AC | PRN

## 2022-08-19 DIAGNOSIS — H2511 Age-related nuclear cataract, right eye: Secondary | ICD-10-CM | POA: Diagnosis not present

## 2022-08-19 DIAGNOSIS — H18422 Band keratopathy, left eye: Secondary | ICD-10-CM | POA: Diagnosis not present

## 2022-08-19 DIAGNOSIS — M3501 Sicca syndrome with keratoconjunctivitis: Secondary | ICD-10-CM | POA: Diagnosis not present

## 2022-08-19 DIAGNOSIS — H26112 Localized traumatic opacities, left eye: Secondary | ICD-10-CM | POA: Diagnosis not present

## 2022-08-19 LAB — HM DIABETES EYE EXAM

## 2022-09-11 ENCOUNTER — Telehealth: Payer: Self-pay | Admitting: Family Medicine

## 2022-09-11 NOTE — Telephone Encounter (Signed)
Contacted Taylor Perry to schedule their annual wellness visit. Appointment made for 10/18/2022.  [place] Direct Dial: [phone]

## 2022-09-16 ENCOUNTER — Ambulatory Visit
Admission: RE | Admit: 2022-09-16 | Discharge: 2022-09-16 | Disposition: A | Discharge status: Home / Self Care | Payer: 59 | Source: Ambulatory Visit | Attending: Oncology | Admitting: Oncology

## 2022-09-16 DIAGNOSIS — C55 Malignant neoplasm of uterus, part unspecified: Secondary | ICD-10-CM | POA: Insufficient documentation

## 2022-09-16 LAB — POCT I-STAT CREATININE: Creatinine, Ser: 0.9 mg/dL (ref 0.44–1.00)

## 2022-09-16 MED ORDER — IOHEXOL 300 MG/ML  SOLN
100.0000 mL | Freq: Once | INTRAMUSCULAR | Status: AC | PRN
  Administered 2022-09-16: 100 mL via INTRAVENOUS

## 2022-09-17 ENCOUNTER — Ambulatory Visit: Payer: Medicare Other | Admitting: Radiation Oncology

## 2022-09-19 ENCOUNTER — Encounter: Payer: Self-pay | Admitting: Radiation Oncology

## 2022-09-19 ENCOUNTER — Ambulatory Visit
Admission: RE | Admit: 2022-09-19 | Discharge: 2022-09-19 | Disposition: A | Discharge status: Home / Self Care | Payer: 59 | Source: Ambulatory Visit | Attending: Radiation Oncology | Admitting: Radiation Oncology

## 2022-09-19 VITALS — BP 133/85 | HR 91 | Temp 98.0°F | Resp 17 | Wt 247.0 lb

## 2022-09-19 DIAGNOSIS — C55 Malignant neoplasm of uterus, part unspecified: Secondary | ICD-10-CM

## 2022-09-19 DIAGNOSIS — Z8542 Personal history of malignant neoplasm of other parts of uterus: Secondary | ICD-10-CM | POA: Diagnosis present

## 2022-09-19 DIAGNOSIS — Z923 Personal history of irradiation: Secondary | ICD-10-CM | POA: Insufficient documentation

## 2022-09-19 DIAGNOSIS — Z90722 Acquired absence of ovaries, bilateral: Secondary | ICD-10-CM | POA: Diagnosis not present

## 2022-09-19 DIAGNOSIS — Z90710 Acquired absence of both cervix and uterus: Secondary | ICD-10-CM | POA: Insufficient documentation

## 2022-09-19 NOTE — Progress Notes (Signed)
Radiation Oncology Follow up Note  Name: Taylor Perry   Date:   09/19/2022 MRN:  [id number] DOB: 09/17/1950    This 72 y.o. female presents to the clinic today for 91-month follow-up status post both external beam radiation therapy as well as vaginal brachytherapy for stage IIIc (T3b N1 M0 carcinosarcoma of the uterus status post TAH/BSO and adjuvant chemotherapy.  REFERRING PROVIDER: Steele Sizer, MD  HPI: Patient is a 72 year old female now out 6 months having completed both adjuvant chemotherapy as well as external beam treatment to her pelvis and vaginal brachytherapy for stage IIIc carcinosarcoma of the uterus.  Seen today in routine follow-up she is doing well specifically denies any increased lower urinary tract symptoms diarrhea or fatigue.  She is having no vaginal bleeding.  She recently had a CT scan.  Of chest abdomen pelvis showing no evidence of local recurrence or metastatic disease.  COMPLICATIONS OF TREATMENT: none  FOLLOW UP COMPLIANCE: keeps appointments   PHYSICAL EXAM:  BP 133/85 (Patient Position: Sitting)   Pulse 91   Temp 98 F (36.7 C) (Tympanic)   Resp 17   Wt 247 lb (112 kg)   SpO2 100%   BMI 38.69 kg/m  Well-developed well-nourished patient in NAD. HEENT reveals PERLA, EOMI, discs not visualized.  Oral cavity is clear. No oral mucosal lesions are identified. Neck is clear without evidence of cervical or supraclavicular adenopathy. Lungs are clear to A&P. Cardiac examination is essentially unremarkable with regular rate and rhythm without murmur rub or thrill. Abdomen is benign with no organomegaly or masses noted. Motor sensory and DTR levels are equal and symmetric in the upper and lower extremities. Cranial nerves II through XII are grossly intact. Proprioception is intact. No peripheral adenopathy or edema is identified. No motor or sensory levels are noted. Crude visual fields are within normal range.  RADIOLOGY RESULTS: CT scans reviewed compatible  with above-stated findings  PLAN: Present time patient continues to do well 6 months out with no evidence of disease.  She continues close follow-up care with medical oncology and GYN oncology.  I have asked to see her back in 6 months for follow-up.  Patient knows to call sooner with any concerns.  I would like to take this opportunity to thank you for allowing me to participate in the care of your patient.Noreene Filbert, MD

## 2022-09-24 ENCOUNTER — Other Ambulatory Visit: Payer: Self-pay

## 2022-10-01 ENCOUNTER — Telehealth: Payer: Self-pay | Admitting: Family Medicine

## 2022-10-01 NOTE — Telephone Encounter (Signed)
Contacted Taylor Perry to schedule their annual wellness visit. Appointment made for 10/18/2022.  [facility] Care Guide [facility] Direct Dial: [phone]    

## 2022-10-03 ENCOUNTER — Telehealth: Payer: Self-pay

## 2022-10-03 ENCOUNTER — Inpatient Hospital Stay: Payer: 59 | Attending: Oncology

## 2022-10-03 ENCOUNTER — Inpatient Hospital Stay (HOSPITAL_BASED_OUTPATIENT_CLINIC_OR_DEPARTMENT_OTHER): Payer: 59 | Admitting: Oncology

## 2022-10-03 ENCOUNTER — Encounter: Payer: Self-pay | Admitting: Oncology

## 2022-10-03 VITALS — BP 133/93 | HR 97 | Temp 96.0°F | Resp 18 | Wt 248.0 lb

## 2022-10-03 DIAGNOSIS — C55 Malignant neoplasm of uterus, part unspecified: Secondary | ICD-10-CM

## 2022-10-03 DIAGNOSIS — Z90722 Acquired absence of ovaries, bilateral: Secondary | ICD-10-CM | POA: Diagnosis not present

## 2022-10-03 DIAGNOSIS — Z90710 Acquired absence of both cervix and uterus: Secondary | ICD-10-CM | POA: Diagnosis not present

## 2022-10-03 DIAGNOSIS — Z6838 Body mass index (BMI) 38.0-38.9, adult: Secondary | ICD-10-CM | POA: Insufficient documentation

## 2022-10-03 DIAGNOSIS — Z08 Encounter for follow-up examination after completed treatment for malignant neoplasm: Secondary | ICD-10-CM | POA: Insufficient documentation

## 2022-10-03 DIAGNOSIS — L4050 Arthropathic psoriasis, unspecified: Secondary | ICD-10-CM

## 2022-10-03 DIAGNOSIS — D649 Anemia, unspecified: Secondary | ICD-10-CM

## 2022-10-03 DIAGNOSIS — Z923 Personal history of irradiation: Secondary | ICD-10-CM | POA: Insufficient documentation

## 2022-10-03 DIAGNOSIS — Z8542 Personal history of malignant neoplasm of other parts of uterus: Secondary | ICD-10-CM | POA: Insufficient documentation

## 2022-10-03 DIAGNOSIS — Z9221 Personal history of antineoplastic chemotherapy: Secondary | ICD-10-CM | POA: Diagnosis not present

## 2022-10-03 DIAGNOSIS — M069 Rheumatoid arthritis, unspecified: Secondary | ICD-10-CM | POA: Diagnosis not present

## 2022-10-03 LAB — TECHNOLOGIST SMEAR REVIEW: Plt Morphology: DECREASED

## 2022-10-03 LAB — IRON AND TIBC
Iron: 91 ug/dL (ref 28–170)
Saturation Ratios: 33 % — ABNORMAL HIGH (ref 10.4–31.8)
TIBC: 276 ug/dL (ref 250–450)
UIBC: 185 ug/dL

## 2022-10-03 LAB — CBC WITH DIFFERENTIAL/PLATELET
Abs Immature Granulocytes: 0.03 10*3/uL (ref 0.00–0.07)
Basophils Absolute: 0 10*3/uL (ref 0.0–0.1)
Basophils Relative: 0 %
Eosinophils Absolute: 0.1 10*3/uL (ref 0.0–0.5)
Eosinophils Relative: 2 %
HCT: 26.1 % — ABNORMAL LOW (ref 36.0–46.0)
Hemoglobin: 8.3 g/dL — ABNORMAL LOW (ref 12.0–15.0)
Immature Granulocytes: 1 %
Lymphocytes Relative: 15 %
Lymphs Abs: 0.5 10*3/uL — ABNORMAL LOW (ref 0.7–4.0)
MCH: 29.1 pg (ref 26.0–34.0)
MCHC: 31.8 g/dL (ref 30.0–36.0)
MCV: 91.6 fL (ref 80.0–100.0)
Monocytes Absolute: 0.3 10*3/uL (ref 0.1–1.0)
Monocytes Relative: 10 %
Neutro Abs: 2.3 10*3/uL (ref 1.7–7.7)
Neutrophils Relative %: 72 %
Platelets: 135 10*3/uL — ABNORMAL LOW (ref 150–400)
RBC: 2.85 MIL/uL — ABNORMAL LOW (ref 3.87–5.11)
RDW: 16.3 % — ABNORMAL HIGH (ref 11.5–15.5)
WBC: 3.2 10*3/uL — ABNORMAL LOW (ref 4.0–10.5)
nRBC: 0 % (ref 0.0–0.2)

## 2022-10-03 LAB — COMPREHENSIVE METABOLIC PANEL
ALT: 22 U/L (ref 0–44)
AST: 35 U/L (ref 15–41)
Albumin: 3.8 g/dL (ref 3.5–5.0)
Alkaline Phosphatase: 90 U/L (ref 38–126)
Anion gap: 7 (ref 5–15)
BUN: 19 mg/dL (ref 8–23)
CO2: 26 mmol/L (ref 22–32)
Calcium: 9 mg/dL (ref 8.9–10.3)
Chloride: 106 mmol/L (ref 98–111)
Creatinine, Ser: 0.86 mg/dL (ref 0.44–1.00)
GFR, Estimated: >60 mL/min (ref >60)
Glucose, Bld: 110 mg/dL — ABNORMAL HIGH (ref 70–99)
Potassium: 3.7 mmol/L (ref 3.5–5.1)
Sodium: 139 mmol/L (ref 135–145)
Total Bilirubin: 1 mg/dL (ref 0.3–1.2)
Total Protein: 7.6 g/dL (ref 6.5–8.1)

## 2022-10-03 LAB — RETIC PANEL
Immature Retic Fract: 25.6 % — ABNORMAL HIGH (ref 2.3–15.9)
RBC.: 2.84 MIL/uL — ABNORMAL LOW (ref 3.87–5.11)
Retic Count, Absolute: 72.1 10*3/uL (ref 19.0–186.0)
Retic Ct Pct: 2.5 % (ref 0.4–3.1)
Reticulocyte Hemoglobin: 29.4 pg (ref >27.9)

## 2022-10-03 LAB — VITAMIN B12: Vitamin B-12: 961 pg/mL — ABNORMAL HIGH (ref 180–914)

## 2022-10-03 LAB — LACTATE DEHYDROGENASE: LDH: 292 U/L — ABNORMAL HIGH (ref 98–192)

## 2022-10-03 LAB — FERRITIN: Ferritin: 705 ng/mL — ABNORMAL HIGH (ref 11–307)

## 2022-10-03 NOTE — Assessment & Plan Note (Addendum)
#  Carcinosarcoma of uterus, stage IIIc, left SLN macrometastasis and positive vaginal margin.   S/p adjuvant chemotherapy, carboplatin/Taxol for 6 cycles [finished July 2023],  followed by external radiation and VBT. Labs reviewed and discussed once with patient. CT scan showed NED Repeat CT in Sept 2024

## 2022-10-03 NOTE — Assessment & Plan Note (Signed)
Rheumatoid arthritis/Psoriatic arthritis  previously on Humira which is currently on hold. Manageable symptoms.  Continue follow-up with rheumatology
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
## 2022-10-03 NOTE — Telephone Encounter (Signed)
Bone marrow biopsy - anemia  2 -3 weeks after bm biopsy MD visit

## 2022-10-03 NOTE — Assessment & Plan Note (Signed)
Hemoglobin has mildly improved although has not recovered to her baseline as expected. Workup showed elevated ferritin and iron saturation, adequate B12 and folate. Suspect anemia secondary to pre-existing bone marrow disorders/ineffective erythropoiesis, anemia secondary to chronic disease. Hb remains low. Recommend bone marrow biopsy for evaluation.

## 2022-10-04 ENCOUNTER — Encounter: Payer: Self-pay | Admitting: Oncology

## 2022-10-04 ENCOUNTER — Other Ambulatory Visit: Payer: Self-pay

## 2022-10-04 NOTE — Telephone Encounter (Signed)
Pt scheduled for Bm Bx on 4/24. She is aware of appt.   Please schedule and inform pt of appt:  MD only 2 -3 weeks after biopsy

## 2022-10-09 ENCOUNTER — Inpatient Hospital Stay (HOSPITAL_BASED_OUTPATIENT_CLINIC_OR_DEPARTMENT_OTHER): Payer: 59 | Admitting: Obstetrics and Gynecology

## 2022-10-09 VITALS — BP 132/78 | HR 99 | Temp 96.0°F | Resp 17 | Wt 247.0 lb

## 2022-10-09 DIAGNOSIS — Z08 Encounter for follow-up examination after completed treatment for malignant neoplasm: Secondary | ICD-10-CM

## 2022-10-09 DIAGNOSIS — Z8542 Personal history of malignant neoplasm of other parts of uterus: Secondary | ICD-10-CM | POA: Diagnosis not present

## 2022-10-09 DIAGNOSIS — D649 Anemia, unspecified: Secondary | ICD-10-CM | POA: Diagnosis not present

## 2022-10-09 DIAGNOSIS — Z923 Personal history of irradiation: Secondary | ICD-10-CM | POA: Diagnosis not present

## 2022-10-09 DIAGNOSIS — C55 Malignant neoplasm of uterus, part unspecified: Secondary | ICD-10-CM

## 2022-10-09 DIAGNOSIS — Z9221 Personal history of antineoplastic chemotherapy: Secondary | ICD-10-CM | POA: Diagnosis not present

## 2022-10-09 DIAGNOSIS — M069 Rheumatoid arthritis, unspecified: Secondary | ICD-10-CM | POA: Diagnosis not present
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
## 2022-10-10 ENCOUNTER — Other Ambulatory Visit: Payer: Self-pay | Admitting: Family Medicine

## 2022-10-10 DIAGNOSIS — E1169 Type 2 diabetes mellitus with other specified complication: Secondary | ICD-10-CM

## 2022-10-11 ENCOUNTER — Other Ambulatory Visit: Payer: Self-pay

## 2022-10-11 DIAGNOSIS — E1169 Type 2 diabetes mellitus with other specified complication: Secondary | ICD-10-CM

## 2022-10-15 ENCOUNTER — Other Ambulatory Visit: Payer: Self-pay | Admitting: Student

## 2022-10-15 ENCOUNTER — Other Ambulatory Visit (HOSPITAL_COMMUNITY): Payer: Self-pay | Admitting: Student

## 2022-10-15 DIAGNOSIS — D61818 Other pancytopenia: Secondary | ICD-10-CM
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
## 2022-10-16 ENCOUNTER — Telehealth: Payer: Self-pay

## 2022-10-16 ENCOUNTER — Encounter: Payer: Self-pay | Admitting: Radiology

## 2022-10-16 ENCOUNTER — Other Ambulatory Visit: Payer: Self-pay

## 2022-10-16 ENCOUNTER — Inpatient Hospital Stay: Payer: 59

## 2022-10-16 ENCOUNTER — Ambulatory Visit
Admission: RE | Admit: 2022-10-16 | Discharge: 2022-10-16 | Disposition: A | Discharge status: Home / Self Care | Payer: 59 | Source: Ambulatory Visit | Attending: Oncology | Admitting: Oncology

## 2022-10-16 ENCOUNTER — Other Ambulatory Visit: Payer: Self-pay | Admitting: Oncology

## 2022-10-16 DIAGNOSIS — D649 Anemia, unspecified: Secondary | ICD-10-CM | POA: Insufficient documentation

## 2022-10-16 DIAGNOSIS — Z8542 Personal history of malignant neoplasm of other parts of uterus: Secondary | ICD-10-CM | POA: Insufficient documentation

## 2022-10-16 DIAGNOSIS — Z08 Encounter for follow-up examination after completed treatment for malignant neoplasm: Secondary | ICD-10-CM | POA: Diagnosis not present

## 2022-10-16 DIAGNOSIS — M069 Rheumatoid arthritis, unspecified: Secondary | ICD-10-CM | POA: Diagnosis not present

## 2022-10-16 DIAGNOSIS — D61818 Other pancytopenia: Secondary | ICD-10-CM | POA: Diagnosis not present

## 2022-10-16 DIAGNOSIS — Z923 Personal history of irradiation: Secondary | ICD-10-CM | POA: Insufficient documentation

## 2022-10-16 DIAGNOSIS — Z9221 Personal history of antineoplastic chemotherapy: Secondary | ICD-10-CM | POA: Diagnosis not present

## 2022-10-16 DIAGNOSIS — D72810 Lymphocytopenia: Secondary | ICD-10-CM | POA: Diagnosis not present

## 2022-10-16 HISTORY — PX: IR BONE MARROW BIOPSY & ASPIRATION: IMG5727

## 2022-10-16 LAB — CBC WITH DIFFERENTIAL/PLATELET
Abs Immature Granulocytes: 0.02 10*3/uL (ref 0.00–0.07)
Basophils Absolute: 0 10*3/uL (ref 0.0–0.1)
Basophils Relative: 0 %
Eosinophils Absolute: 0.1 10*3/uL (ref 0.0–0.5)
Eosinophils Relative: 2 %
HCT: 21 % — ABNORMAL LOW (ref 36.0–46.0)
Hemoglobin: 6.4 g/dL — ABNORMAL LOW (ref 12.0–15.0)
Immature Granulocytes: 1 %
Lymphocytes Relative: 14 %
Lymphs Abs: 0.5 10*3/uL — ABNORMAL LOW (ref 0.7–4.0)
MCH: 29 pg (ref 26.0–34.0)
MCHC: 30.5 g/dL (ref 30.0–36.0)
MCV: 95 fL (ref 80.0–100.0)
Monocytes Absolute: 0.4 10*3/uL (ref 0.1–1.0)
Monocytes Relative: 12 %
Neutro Abs: 2.6 10*3/uL (ref 1.7–7.7)
Neutrophils Relative %: 71 %
Platelets: 110 10*3/uL — ABNORMAL LOW (ref 150–400)
RBC: 2.21 MIL/uL — ABNORMAL LOW (ref 3.87–5.11)
RDW: 18.3 % — ABNORMAL HIGH (ref 11.5–15.5)
WBC: 3.7 10*3/uL — ABNORMAL LOW (ref 4.0–10.5)
nRBC: 0 % (ref 0.0–0.2)

## 2022-10-16 LAB — TYPE AND SCREEN: Antibody Screen: POSITIVE

## 2022-10-16 LAB — PREPARE RBC (CROSSMATCH)

## 2022-10-16 MED ORDER — SODIUM CHLORIDE 0.9 % IV SOLN: INTRAVENOUS | Status: DC

## 2022-10-16 MED ORDER — HEPARIN SOD (PORK) LOCK FLUSH 100 UNIT/ML IV SOLN
INTRAVENOUS | Status: AC
  Filled 2022-10-16: qty 5

## 2022-10-16 MED ORDER — MIDAZOLAM HCL 2 MG/2ML IJ SOLN
INTRAMUSCULAR | Status: AC
  Filled 2022-10-16: qty 2

## 2022-10-16 MED ORDER — FENTANYL CITRATE (PF) 100 MCG/2ML IJ SOLN
INTRAMUSCULAR | Status: AC | PRN
  Administered 2022-10-16: 50 ug via INTRAVENOUS
  Administered 2022-10-16: 25 ug via INTRAVENOUS

## 2022-10-16 MED ORDER — FENTANYL CITRATE (PF) 100 MCG/2ML IJ SOLN
INTRAMUSCULAR | Status: AC
  Filled 2022-10-16: qty 2

## 2022-10-16 MED ORDER — MIDAZOLAM HCL 5 MG/5ML IJ SOLN
INTRAMUSCULAR | Status: AC | PRN
  Administered 2022-10-16: .5 mg via INTRAVENOUS
  Administered 2022-10-16: 1 mg via INTRAVENOUS

## 2022-10-16 NOTE — Progress Notes (Signed)
Hgb 6.4, provider notified and patient D/C'd home w/instructions to go to the cancer center now for a type and screen with 1 unit of blood to follow.

## 2022-10-16 NOTE — Progress Notes (Signed)
Patient clinically stable post BMB per Dr Lowella Dandy, tolerated well. Vitals stable  pre and post procedure. Received Versed 1.5 mg along with Fentanyl 75 mcg IV for procedure. Report given to [place] Rn post procedure/11, denies complaints post procedure.

## 2022-10-16 NOTE — Telephone Encounter (Signed)
Received notice from Taylor Daring RN from IR stating that pt's hemoglobin is 6.4. Pt has been scheduled for Blood transfusiom here at cancer center tomorrow.

## 2022-10-16 NOTE — Procedures (Signed)
Interventional Radiology Procedure:   Indications: History of carcinosarcoma of uterus and normocytic anemia   Procedure: Fluoroscopic guided bone marrow biopsy  Findings: 2 aspirates and 1 core from right ilium  Complications: None     EBL: Minimal, less than 10 ml  Plan: Discharge to home in one hour.   Maat Kafer R. Lowella Dandy, MD  Pager: [phone]

## 2022-10-17 ENCOUNTER — Inpatient Hospital Stay: Payer: 59

## 2022-10-17 DIAGNOSIS — Z9221 Personal history of antineoplastic chemotherapy: Secondary | ICD-10-CM | POA: Diagnosis not present

## 2022-10-17 DIAGNOSIS — D649 Anemia, unspecified: Secondary | ICD-10-CM | POA: Diagnosis not present

## 2022-10-17 DIAGNOSIS — Z923 Personal history of irradiation: Secondary | ICD-10-CM | POA: Diagnosis not present

## 2022-10-17 DIAGNOSIS — M069 Rheumatoid arthritis, unspecified: Secondary | ICD-10-CM | POA: Diagnosis not present

## 2022-10-17 DIAGNOSIS — Z08 Encounter for follow-up examination after completed treatment for malignant neoplasm: Secondary | ICD-10-CM | POA: Diagnosis not present

## 2022-10-17 LAB — BPAM RBC
ISSUE DATE / TIME: 202404250916
Unit Type and Rh: 5100

## 2022-10-17 LAB — TYPE AND SCREEN

## 2022-10-17 MED ORDER — DIPHENHYDRAMINE HCL 25 MG PO CAPS
25.0000 mg | ORAL_CAPSULE | Freq: Once | ORAL | Status: AC
  Administered 2022-10-17: 25 mg via ORAL
  Filled 2022-10-17: qty 1

## 2022-10-17 MED ORDER — SODIUM CHLORIDE 0.9% IV SOLUTION
250.0000 mL | Freq: Once | INTRAVENOUS | Status: DC
  Filled 2022-10-17: qty 250

## 2022-10-17 MED ORDER — ACETAMINOPHEN 325 MG PO TABS
650.0000 mg | ORAL_TABLET | Freq: Once | ORAL | Status: AC
  Administered 2022-10-17: 650 mg via ORAL
  Filled 2022-10-17: qty 2

## 2022-10-17 NOTE — Patient Instructions (Signed)
Blood Transfusion, Adult A blood transfusion is a procedure in which you receive blood through an IV tube. You may need this procedure because of: A bleeding disorder. An illness. An injury. A surgery. The blood may come from someone else (a donor). You may also be able to donate blood for yourself before a surgery. The blood given in a transfusion may be made up of different types of cells. You may get: Red blood cells. These carry oxygen to the cells in the body. Platelets. These help your blood to clot. Plasma. This is the liquid part of your blood. It carries proteins and other substances through the body. White blood cells. These help you fight infections. If you have a clotting disorder, you may also get other types of blood products. Depending on the type of blood product, this procedure may take 1-4 hours to complete. Tell your doctor about: Any bleeding problems you have. Any reactions you have had during a blood transfusion in the past. Any allergies you have. All medicines you are taking, including vitamins, herbs, eye drops, creams, and over-the-counter medicines. Any surgeries you have had. Any medical conditions you have. Whether you are pregnant or may be pregnant. What are the risks? Talk with your health care provider about risks. The most common problems include: A mild allergic reaction. This includes red, swollen areas of skin (hives) and itching. Fever or chills. This may be the body's response to new blood cells received. This may happen during or up to 4 hours after the transfusion. More serious problems may include: A serious allergic reaction. This includes breathing trouble or swelling around the face and lips. Too much fluid in the lungs. This may cause breathing problems. Lung injury. This causes breathing trouble and low oxygen in the blood. This can happen within hours of the transfusion or days later. Too much iron. This can happen after getting many blood  transfusions over a period of time. An infection or virus passed through the blood. This is rare. Donated blood is carefully tested before it is given. Your body's defense system (immune system) trying to attack the new blood cells. This is rare. Symptoms may include fever, chills, nausea, low blood pressure, and low back or chest pain. Donated cells attacking healthy tissues. This is rare. What happens before the procedure? You will have a blood test to find out your blood type. The test also finds out what type of blood your body will accept and matches it to the donor type. If you are going to have a planned surgery, you may be able to donate your own blood. This may be done in case you need a transfusion. You will have your temperature, blood pressure, and pulse checked. You may receive medicine to help prevent an allergic reaction. This may be done if you have had a reaction to a transfusion before. This medicine may be given to you by mouth or through an IV tube. What happens during the procedure?  An IV tube will be put into one of your veins. The bag of blood will be attached to your IV tube. Then, the blood will enter through your vein. Your temperature, blood pressure, and pulse will be checked often. This is done to find early signs of a transfusion reaction. Tell your nurse right away if you have any of these symptoms: Shortness of breath or trouble breathing. Chest or back pain. Fever or chills. Red, swollen areas of skin or itching. If you have any signs   or symptoms of a reaction, your transfusion will be stopped. You may also be given medicine. When the transfusion is finished, your IV tube will be taken out. Pressure may be put on the IV site for a few minutes. A bandage (dressing) will be put on the IV site. The procedure may vary among doctors and hospitals. What happens after the procedure? You will be monitored until you leave the hospital or clinic. This includes  checking your temperature, blood pressure, pulse, breathing rate, and blood oxygen level. Your blood may be tested to see how you have responded to the transfusion. You may be warmed with fluids or blankets. This is done to keep the temperature of your body normal. If you have your procedure in an outpatient setting, you will be told whom to contact to report any reactions. Where to find more information Visit the [employer]: redcross.org Summary A blood transfusion is a procedure in which you receive blood through an IV tube. The blood you are given may be made up of different blood cells. You may receive red blood cells, platelets, plasma, or white blood cells. Your temperature, blood pressure, and pulse will be checked often. After the procedure, your blood may be tested to see how you have responded. This information is not intended to replace advice given to you by your health care provider. Make sure you discuss any questions you have with your health care provider. Document Revised: 09/07/2021 Document Reviewed: 09/07/2021 Elsevier Patient Education  2023 [employer]  

## 2022-10-18 ENCOUNTER — Telehealth: Payer: Self-pay

## 2022-10-18 ENCOUNTER — Ambulatory Visit (INDEPENDENT_AMBULATORY_CARE_PROVIDER_SITE_OTHER): Payer: 59

## 2022-10-18 VITALS — Ht 67.0 in | Wt 247.0 lb

## 2022-10-18 DIAGNOSIS — Z0000 Encounter for general adult medical examination without abnormal findings: Secondary | ICD-10-CM | POA: Diagnosis not present

## 2022-10-18 DIAGNOSIS — D649 Anemia, unspecified: Secondary | ICD-10-CM

## 2022-10-18 LAB — BPAM RBC: Blood Product Expiration Date: 202405222359

## 2022-10-18 LAB — TYPE AND SCREEN
ABO/RH(D): O POS
Unit division: 0

## 2022-10-18 NOTE — Patient Instructions (Addendum)
[name] , Thank you for taking time to come for your Medicare Wellness Visit. I appreciate your ongoing commitment to your health goals. Please review the following plan we discussed and let me know if I can assist you in the future.   These are the goals we discussed:  Goals       Increase physical activity      Pt would like to increase physical activity to a minimum of 3 days per week      Increase physical activity (pt-stated)        This is a list of the screening recommended for you and due dates:  Health Maintenance  Topic Date Due   DTaP/Tdap/Td vaccine (2 - Td or Tdap) 02/10/2022   COVID-19 Vaccine (5 - 2023-24 season) 11/03/2022*   Zoster (Shingles) Vaccine (1 of 2) 11/04/2022*   Yearly kidney health urinalysis for diabetes  12/05/2022   Complete foot exam   12/05/2022   Flu Shot  01/23/2023   Hemoglobin A1C  02/04/2023   Eye exam for diabetics  08/20/2023   Yearly kidney function blood test for diabetes  10/03/2023   Medicare Annual Wellness Visit  10/18/2023   Mammogram  03/27/2024   Cologuard (Stool DNA test)  09/06/2024   Pneumonia Vaccine  Completed   DEXA scan (bone density measurement)  Completed   Hepatitis C Screening: USPSTF Recommendation to screen - Ages 58-79 yo.  Completed   HPV Vaccine  Aged Out   Colon Cancer Screening  Discontinued  *Topic was postponed. The date shown is not the original due date.    Advanced directives: Advance directive discussed with you today. Even though you declined this today, please call our office should you change your mind, and we can give you the proper paperwork for you to fill out.   Conditions/risks identified: None  Next appointment: Follow up in one year for your annual wellness visit    Preventive Care 65 Years and Older, Female Preventive care refers to lifestyle choices and visits with your health care provider that can promote health and wellness. What does preventive care include? A yearly physical  exam. This is also called an annual well check. Dental exams once or twice a year. Routine eye exams. Ask your health care provider how often you should have your eyes checked. Personal lifestyle choices, including: Daily care of your teeth and gums. Regular physical activity. Eating a healthy diet. Avoiding tobacco and drug use. Limiting alcohol use. Practicing safe sex. Taking low-dose aspirin every day. Taking vitamin and mineral supplements as recommended by your health care provider. What happens during an annual well check? The services and screenings done by your health care provider during your annual well check will depend on your age, overall health, lifestyle risk factors, and family history of disease. Counseling  Your health care provider may ask you questions about your: Alcohol use. Tobacco use. Drug use. Emotional well-being. Home and relationship well-being. Sexual activity. Eating habits. History of falls. Memory and ability to understand (cognition). Work and work Astronomer. Reproductive health. Screening  You may have the following tests or measurements: Height, weight, and BMI. Blood pressure. Lipid and cholesterol levels. These may be checked every 5 years, or more frequently if you are over 79 years old. Skin check. Lung cancer screening. You may have this screening every year starting at age 23 if you have a 30-pack-year history of smoking and currently smoke or have quit within the past 15 years. Fecal occult  blood test (FOBT) of the stool. You may have this test every year starting at age 70. Flexible sigmoidoscopy or colonoscopy. You may have a sigmoidoscopy every 5 years or a colonoscopy every 10 years starting at age 74. Hepatitis C blood test. Hepatitis B blood test. Sexually transmitted disease (STD) testing. Diabetes screening. This is done by checking your blood sugar (glucose) after you have not eaten for a while (fasting). You may have this  done every 1-3 years. Bone density scan. This is done to screen for osteoporosis. You may have this done starting at age 70. Mammogram. This may be done every 1-2 years. Talk to your health care provider about how often you should have regular mammograms. Talk with your health care provider about your test results, treatment options, and if necessary, the need for more tests. Vaccines  Your health care provider may recommend certain vaccines, such as: Influenza vaccine. This is recommended every year. Tetanus, diphtheria, and acellular pertussis (Tdap, Td) vaccine. You may need a Td booster every 10 years. Zoster vaccine. You may need this after age 37. Pneumococcal 13-valent conjugate (PCV13) vaccine. One dose is recommended after age 66. Pneumococcal polysaccharide (PPSV23) vaccine. One dose is recommended after age 28. Talk to your health care provider about which screenings and vaccines you need and how often you need them. This information is not intended to replace advice given to you by your health care provider. Make sure you discuss any questions you have with your health care provider. Document Released: 07/07/2015 Document Revised: 02/28/2016 Document Reviewed: 04/11/2015 Elsevier Interactive Patient Education  2017 [employer].  Fall Prevention in the Home Falls can cause injuries. They can happen to people of all ages. There are many things you can do to make your home safe and to help prevent falls. What can I do on the outside of my home? Regularly fix the edges of walkways and driveways and fix any cracks. Remove anything that might make you trip as you walk through a door, such as a raised step or threshold. Trim any bushes or trees on the path to your home. Use bright outdoor lighting. Clear any walking paths of anything that might make someone trip, such as rocks or tools. Regularly check to see if handrails are loose or broken. Make sure that both sides of any steps have  handrails. Any raised decks and porches should have guardrails on the edges. Have any leaves, snow, or ice cleared regularly. Use sand or salt on walking paths during winter. Clean up any spills in your garage right away. This includes oil or grease spills. What can I do in the bathroom? Use night lights. Install grab bars by the toilet and in the tub and shower. Do not use towel bars as grab bars. Use non-skid mats or decals in the tub or shower. If you need to sit down in the shower, use a plastic, non-slip stool. Keep the floor dry. Clean up any water that spills on the floor as soon as it happens. Remove soap buildup in the tub or shower regularly. Attach bath mats securely with double-sided non-slip rug tape. Do not have throw rugs and other things on the floor that can make you trip. What can I do in the bedroom? Use night lights. Make sure that you have a light by your bed that is easy to reach. Do not use any sheets or blankets that are too big for your bed. They should not hang down onto the floor.  Have a firm chair that has side arms. You can use this for support while you get dressed. Do not have throw rugs and other things on the floor that can make you trip. What can I do in the kitchen? Clean up any spills right away. Avoid walking on wet floors. Keep items that you use a lot in easy-to-reach places. If you need to reach something above you, use a strong step stool that has a grab bar. Keep electrical cords out of the way. Do not use floor polish or wax that makes floors slippery. If you must use wax, use non-skid floor wax. Do not have throw rugs and other things on the floor that can make you trip. What can I do with my stairs? Do not leave any items on the stairs. Make sure that there are handrails on both sides of the stairs and use them. Fix handrails that are broken or loose. Make sure that handrails are as long as the stairways. Check any carpeting to make sure  that it is firmly attached to the stairs. Fix any carpet that is loose or worn. Avoid having throw rugs at the top or bottom of the stairs. If you do have throw rugs, attach them to the floor with carpet tape. Make sure that you have a light switch at the top of the stairs and the bottom of the stairs. If you do not have them, ask someone to add them for you. What else can I do to help prevent falls? Wear shoes that: Do not have high heels. Have rubber bottoms. Are comfortable and fit you well. Are closed at the toe. Do not wear sandals. If you use a stepladder: Make sure that it is fully opened. Do not climb a closed stepladder. Make sure that both sides of the stepladder are locked into place. Ask someone to hold it for you, if possible. Clearly mark and make sure that you can see: Any grab bars or handrails. First and last steps. Where the edge of each step is. Use tools that help you move around (mobility aids) if they are needed. These include: Canes. Walkers. Scooters. Crutches. Turn on the lights when you go into a dark area. Replace any light bulbs as soon as they burn out. Set up your furniture so you have a clear path. Avoid moving your furniture around. If any of your floors are uneven, fix them. If there are any pets around you, be aware of where they are. Review your medicines with your doctor. Some medicines can make you feel dizzy. This can increase your chance of falling. Ask your doctor what other things that you can do to help prevent falls. This information is not intended to replace advice given to you by your health care provider. Make sure you discuss any questions you have with your health care provider. Document Released: 04/06/2009 Document Revised: 11/16/2015 Document Reviewed: 07/15/2014 Elsevier Interactive Patient Education  2017 [employer].

## 2022-10-18 NOTE — Telephone Encounter (Signed)
Called and spoke to pt to inform her of follow up plan for next week. Pt verbalized understanding.   Please schedule and inform pt of appts:   Labs next week around 5/1- day 1 Possible PRBC - Day 2

## 2022-10-18 NOTE — Telephone Encounter (Signed)
-----   Message from Rickard Patience, MD sent at 10/18/2022  2:25 PM EDT ----- Please arrange pt to get lab cbc + hold tube with possible D2 PRBC next week, around 5/1. Thanks.
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
## 2022-10-19 ENCOUNTER — Other Ambulatory Visit: Payer: Self-pay

## 2022-10-23 ENCOUNTER — Other Ambulatory Visit: Payer: Self-pay | Admitting: Oncology

## 2022-10-23 ENCOUNTER — Inpatient Hospital Stay: Payer: 59 | Attending: Oncology

## 2022-10-23 DIAGNOSIS — L4050 Arthropathic psoriasis, unspecified: Secondary | ICD-10-CM | POA: Insufficient documentation

## 2022-10-23 DIAGNOSIS — E559 Vitamin D deficiency, unspecified: Secondary | ICD-10-CM | POA: Insufficient documentation

## 2022-10-23 DIAGNOSIS — E1140 Type 2 diabetes mellitus with diabetic neuropathy, unspecified: Secondary | ICD-10-CM | POA: Diagnosis not present

## 2022-10-23 DIAGNOSIS — K449 Diaphragmatic hernia without obstruction or gangrene: Secondary | ICD-10-CM | POA: Diagnosis not present

## 2022-10-23 DIAGNOSIS — Z8542 Personal history of malignant neoplasm of other parts of uterus: Secondary | ICD-10-CM | POA: Diagnosis present

## 2022-10-23 DIAGNOSIS — I10 Essential (primary) hypertension: Secondary | ICD-10-CM | POA: Diagnosis not present

## 2022-10-23 DIAGNOSIS — R7989 Other specified abnormal findings of blood chemistry: Secondary | ICD-10-CM | POA: Diagnosis not present

## 2022-10-23 DIAGNOSIS — Z79890 Hormone replacement therapy (postmenopausal): Secondary | ICD-10-CM | POA: Diagnosis not present

## 2022-10-23 DIAGNOSIS — D649 Anemia, unspecified: Secondary | ICD-10-CM | POA: Diagnosis not present

## 2022-10-23 DIAGNOSIS — Z90722 Acquired absence of ovaries, bilateral: Secondary | ICD-10-CM | POA: Diagnosis not present

## 2022-10-23 DIAGNOSIS — E785 Hyperlipidemia, unspecified: Secondary | ICD-10-CM | POA: Insufficient documentation

## 2022-10-23 DIAGNOSIS — K5730 Diverticulosis of large intestine without perforation or abscess without bleeding: Secondary | ICD-10-CM | POA: Insufficient documentation

## 2022-10-23 LAB — CBC WITH DIFFERENTIAL (CANCER CENTER ONLY)
Abs Immature Granulocytes: 0.01 10*3/uL (ref 0.00–0.07)
Basophils Absolute: 0 10*3/uL (ref 0.0–0.1)
Basophils Relative: 0 %
Eosinophils Absolute: 0.1 10*3/uL (ref 0.0–0.5)
Eosinophils Relative: 2 %
HCT: 24.7 % — ABNORMAL LOW (ref 36.0–46.0)
Hemoglobin: 7.7 g/dL — ABNORMAL LOW (ref 12.0–15.0)
Immature Granulocytes: 0 %
Lymphocytes Relative: 17 %
Lymphs Abs: 0.5 10*3/uL — ABNORMAL LOW (ref 0.7–4.0)
MCH: 28.9 pg (ref 26.0–34.0)
MCHC: 31.2 g/dL (ref 30.0–36.0)
MCV: 92.9 fL (ref 80.0–100.0)
Monocytes Absolute: 0.2 10*3/uL (ref 0.1–1.0)
Monocytes Relative: 8 %
Neutro Abs: 2 10*3/uL (ref 1.7–7.7)
Neutrophils Relative %: 73 %
Platelet Count: 126 10*3/uL — ABNORMAL LOW (ref 150–400)
RBC: 2.66 MIL/uL — ABNORMAL LOW (ref 3.87–5.11)
RDW: 20.3 % — ABNORMAL HIGH (ref 11.5–15.5)
WBC Count: 2.8 10*3/uL — ABNORMAL LOW (ref 4.0–10.5)
nRBC: 0 % (ref 0.0–0.2)

## 2022-10-23 LAB — PREPARE RBC (CROSSMATCH)

## 2022-10-23 LAB — SAMPLE TO BLOOD BANK

## 2022-10-23 LAB — TYPE AND SCREEN
ABO/RH(D): O POS
Antibody Screen: POSITIVE

## 2022-10-24 ENCOUNTER — Inpatient Hospital Stay: Payer: 59

## 2022-10-24 ENCOUNTER — Encounter (HOSPITAL_COMMUNITY): Payer: Self-pay | Admitting: Oncology

## 2022-10-24 DIAGNOSIS — Z8542 Personal history of malignant neoplasm of other parts of uterus: Secondary | ICD-10-CM | POA: Diagnosis not present

## 2022-10-24 DIAGNOSIS — L4050 Arthropathic psoriasis, unspecified: Secondary | ICD-10-CM | POA: Diagnosis not present

## 2022-10-24 DIAGNOSIS — I10 Essential (primary) hypertension: Secondary | ICD-10-CM | POA: Diagnosis not present

## 2022-10-24 DIAGNOSIS — K5730 Diverticulosis of large intestine without perforation or abscess without bleeding: Secondary | ICD-10-CM | POA: Diagnosis not present

## 2022-10-24 DIAGNOSIS — D649 Anemia, unspecified: Secondary | ICD-10-CM

## 2022-10-24 DIAGNOSIS — E1140 Type 2 diabetes mellitus with diabetic neuropathy, unspecified: Secondary | ICD-10-CM | POA: Diagnosis not present

## 2022-10-24 DIAGNOSIS — K449 Diaphragmatic hernia without obstruction or gangrene: Secondary | ICD-10-CM | POA: Diagnosis not present

## 2022-10-24 DIAGNOSIS — R7989 Other specified abnormal findings of blood chemistry: Secondary | ICD-10-CM | POA: Diagnosis not present

## 2022-10-24 DIAGNOSIS — E559 Vitamin D deficiency, unspecified: Secondary | ICD-10-CM | POA: Diagnosis not present

## 2022-10-24 DIAGNOSIS — E785 Hyperlipidemia, unspecified: Secondary | ICD-10-CM | POA: Diagnosis not present

## 2022-10-24 LAB — TYPE AND SCREEN

## 2022-10-24 LAB — BPAM RBC
Blood Product Expiration Date: 202405292359
Unit Type and Rh: 5100

## 2022-10-24 MED ORDER — SODIUM CHLORIDE 0.9% IV SOLUTION
250.0000 mL | Freq: Once | INTRAVENOUS | Status: AC
  Administered 2022-10-24: 250 mL via INTRAVENOUS
  Filled 2022-10-24: qty 250

## 2022-10-24 MED ORDER — DIPHENHYDRAMINE HCL 25 MG PO CAPS
25.0000 mg | ORAL_CAPSULE | Freq: Once | ORAL | Status: AC
  Administered 2022-10-24: 25 mg via ORAL
  Filled 2022-10-24: qty 1

## 2022-10-24 MED ORDER — ACETAMINOPHEN 325 MG PO TABS
650.0000 mg | ORAL_TABLET | Freq: Once | ORAL | Status: AC
  Administered 2022-10-24: 650 mg via ORAL
  Filled 2022-10-24: qty 2

## 2022-10-25 LAB — TYPE AND SCREEN: Unit division: 0

## 2022-10-25 LAB — BPAM RBC: ISSUE DATE / TIME: 202405021037

## 2022-10-28 ENCOUNTER — Telehealth: Payer: Self-pay

## 2022-10-28 NOTE — Telephone Encounter (Signed)
-----   Message from Rickard Patience, MD sent at 10/25/2022  5:41 PM EDT ----- Please ask pathology to add myeloid NGS on marrow

## 2022-10-28 NOTE — Telephone Encounter (Signed)
Per Marchelle Folks at [facility] bone marrow lab, testing was added on 4/26 and it is still in process

## 2022-10-31 ENCOUNTER — Encounter (HOSPITAL_COMMUNITY): Payer: Self-pay | Admitting: Oncology

## 2022-11-06 ENCOUNTER — Encounter: Payer: Self-pay | Admitting: Oncology

## 2022-11-06 ENCOUNTER — Inpatient Hospital Stay (HOSPITAL_BASED_OUTPATIENT_CLINIC_OR_DEPARTMENT_OTHER): Payer: 59 | Admitting: Oncology

## 2022-11-06 VITALS — BP 103/79 | HR 93 | Temp 97.8°F | Resp 18 | Wt 251.5 lb

## 2022-11-06 DIAGNOSIS — D649 Anemia, unspecified: Secondary | ICD-10-CM

## 2022-11-06 DIAGNOSIS — Z8542 Personal history of malignant neoplasm of other parts of uterus: Secondary | ICD-10-CM | POA: Diagnosis not present

## 2022-11-06 DIAGNOSIS — E1140 Type 2 diabetes mellitus with diabetic neuropathy, unspecified: Secondary | ICD-10-CM | POA: Diagnosis not present

## 2022-11-06 DIAGNOSIS — K5730 Diverticulosis of large intestine without perforation or abscess without bleeding: Secondary | ICD-10-CM | POA: Diagnosis not present

## 2022-11-06 DIAGNOSIS — R7989 Other specified abnormal findings of blood chemistry: Secondary | ICD-10-CM | POA: Diagnosis not present

## 2022-11-06 DIAGNOSIS — L4050 Arthropathic psoriasis, unspecified: Secondary | ICD-10-CM

## 2022-11-06 DIAGNOSIS — K449 Diaphragmatic hernia without obstruction or gangrene: Secondary | ICD-10-CM | POA: Diagnosis not present

## 2022-11-06 DIAGNOSIS — E559 Vitamin D deficiency, unspecified: Secondary | ICD-10-CM | POA: Diagnosis not present

## 2022-11-06 DIAGNOSIS — E785 Hyperlipidemia, unspecified: Secondary | ICD-10-CM | POA: Diagnosis not present

## 2022-11-06 DIAGNOSIS — I10 Essential (primary) hypertension: Secondary | ICD-10-CM | POA: Diagnosis not present

## 2022-11-06 DIAGNOSIS — C55 Malignant neoplasm of uterus, part unspecified: Secondary | ICD-10-CM | POA: Diagnosis not present

## 2022-11-06 NOTE — Progress Notes (Signed)
Pt here for follow up. No new concerns voiced.   

## 2022-11-06 NOTE — Assessment & Plan Note (Signed)
Hemoglobin has mildly improved although has not recovered to her baseline as expected. Workup showed elevated ferritin and iron saturation, adequate B12 and folate. Suspect anemia secondary to pre-existing bone marrow disorders/ineffective erythropoiesis, anemia secondary to chronic disease. 10/16/22 Bone marrow biopsy was discussed with patient.  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  I discussed with pathology Dr. Laureen Ochs who will further discussed with reading Pathologist Dr. Markham Callas who will call me back next week.
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
## 2022-11-06 NOTE — Assessment & Plan Note (Signed)
Rheumatoid arthritis/Psoriatic arthritis  previously on Humira which is currently on hold. Manageable symptoms.  Continue follow-up with rheumatology 

## 2022-11-06 NOTE — Assessment & Plan Note (Signed)
#  Carcinosarcoma of uterus, stage IIIc, left SLN macrometastasis and positive vaginal margin.   S/p adjuvant chemotherapy, carboplatin/Taxol for 6 cycles [finished July 2023],  followed by external radiation and VBT. Labs reviewed and discussed once with patient. March 2024 CT scan showed NED Repeat CT in Sept 2024

## 2022-11-11 LAB — SURGICAL PATHOLOGY

## 2022-11-13 ENCOUNTER — Telehealth: Payer: Self-pay

## 2022-11-13 DIAGNOSIS — D649 Anemia, unspecified: Secondary | ICD-10-CM

## 2022-11-13 DIAGNOSIS — L4050 Arthropathic psoriasis, unspecified: Secondary | ICD-10-CM

## 2022-11-13 NOTE — Telephone Encounter (Signed)
-----   Message from Rickard Patience, MD sent at 11/12/2022  7:43 PM EDT ----- Please arrange her to get labs cbc, hold tube monthly, starting last week of May thanks.

## 2022-11-13 NOTE — Telephone Encounter (Signed)
Dr. Cathie Hoops has informed pt of plan for monthly lab via Mychart.   Please schedule and update pt of appts:   -Monthly lab (cbc,HT) starting last week of May- until she see's MD in in Sept.  -Keep appts on 6/12 & 6/13 as scheduled.

## 2022-11-14 DIAGNOSIS — M170 Bilateral primary osteoarthritis of knee: Secondary | ICD-10-CM | POA: Diagnosis not present

## 2022-11-14 DIAGNOSIS — M059 Rheumatoid arthritis with rheumatoid factor, unspecified: Secondary | ICD-10-CM | POA: Diagnosis not present

## 2022-11-14 DIAGNOSIS — L409 Psoriasis, unspecified: Secondary | ICD-10-CM | POA: Diagnosis not present

## 2022-11-14 DIAGNOSIS — L4050 Arthropathic psoriasis, unspecified: Secondary | ICD-10-CM | POA: Diagnosis not present

## 2022-11-19 ENCOUNTER — Inpatient Hospital Stay: Payer: 59

## 2022-11-19 DIAGNOSIS — L4050 Arthropathic psoriasis, unspecified: Secondary | ICD-10-CM | POA: Diagnosis not present

## 2022-11-19 DIAGNOSIS — E785 Hyperlipidemia, unspecified: Secondary | ICD-10-CM | POA: Diagnosis not present

## 2022-11-19 DIAGNOSIS — E1140 Type 2 diabetes mellitus with diabetic neuropathy, unspecified: Secondary | ICD-10-CM | POA: Diagnosis not present

## 2022-11-19 DIAGNOSIS — D649 Anemia, unspecified: Secondary | ICD-10-CM

## 2022-11-19 DIAGNOSIS — K5730 Diverticulosis of large intestine without perforation or abscess without bleeding: Secondary | ICD-10-CM | POA: Diagnosis not present

## 2022-11-19 DIAGNOSIS — R7989 Other specified abnormal findings of blood chemistry: Secondary | ICD-10-CM | POA: Diagnosis not present

## 2022-11-19 DIAGNOSIS — I10 Essential (primary) hypertension: Secondary | ICD-10-CM | POA: Diagnosis not present

## 2022-11-19 DIAGNOSIS — E559 Vitamin D deficiency, unspecified: Secondary | ICD-10-CM | POA: Diagnosis not present

## 2022-11-19 DIAGNOSIS — K449 Diaphragmatic hernia without obstruction or gangrene: Secondary | ICD-10-CM | POA: Diagnosis not present

## 2022-11-19 DIAGNOSIS — Z8542 Personal history of malignant neoplasm of other parts of uterus: Secondary | ICD-10-CM | POA: Diagnosis not present

## 2022-11-19 LAB — CBC WITH DIFFERENTIAL (CANCER CENTER ONLY)
Abs Immature Granulocytes: 0.01 10*3/uL (ref 0.00–0.07)
Basophils Absolute: 0 10*3/uL (ref 0.0–0.1)
Basophils Relative: 0 %
Eosinophils Absolute: 0.1 10*3/uL (ref 0.0–0.5)
Eosinophils Relative: 1 %
HCT: 29.6 % — ABNORMAL LOW (ref 36.0–46.0)
Hemoglobin: 9.4 g/dL — ABNORMAL LOW (ref 12.0–15.0)
Immature Granulocytes: 0 %
Lymphocytes Relative: 15 %
Lymphs Abs: 0.6 10*3/uL — ABNORMAL LOW (ref 0.7–4.0)
MCH: 29.8 pg (ref 26.0–34.0)
MCHC: 31.8 g/dL (ref 30.0–36.0)
MCV: 94 fL (ref 80.0–100.0)
Monocytes Absolute: 0.4 10*3/uL (ref 0.1–1.0)
Monocytes Relative: 11 %
Neutro Abs: 2.8 10*3/uL (ref 1.7–7.7)
Neutrophils Relative %: 73 %
Platelet Count: 146 10*3/uL — ABNORMAL LOW (ref 150–400)
RBC: 3.15 MIL/uL — ABNORMAL LOW (ref 3.87–5.11)
RDW: 18.4 % — ABNORMAL HIGH (ref 11.5–15.5)
WBC Count: 3.8 10*3/uL — ABNORMAL LOW (ref 4.0–10.5)
nRBC: 0 % (ref 0.0–0.2)

## 2022-11-19 LAB — SAMPLE TO BLOOD BANK

## 2022-11-21 ENCOUNTER — Other Ambulatory Visit: Payer: Self-pay | Admitting: Family Medicine

## 2022-11-21 DIAGNOSIS — E890 Postprocedural hypothyroidism: Secondary | ICD-10-CM

## 2022-12-04 ENCOUNTER — Telehealth: Payer: Self-pay

## 2022-12-04 ENCOUNTER — Inpatient Hospital Stay: Payer: 59 | Attending: Oncology

## 2022-12-04 ENCOUNTER — Other Ambulatory Visit: Payer: 59

## 2022-12-04 DIAGNOSIS — Z8542 Personal history of malignant neoplasm of other parts of uterus: Secondary | ICD-10-CM | POA: Insufficient documentation

## 2022-12-04 DIAGNOSIS — D649 Anemia, unspecified: Secondary | ICD-10-CM

## 2022-12-04 LAB — CBC WITH DIFFERENTIAL (CANCER CENTER ONLY)
Abs Immature Granulocytes: 0.02 10*3/uL (ref 0.00–0.07)
Basophils Absolute: 0 10*3/uL (ref 0.0–0.1)
Basophils Relative: 0 %
Eosinophils Absolute: 0.1 10*3/uL (ref 0.0–0.5)
Eosinophils Relative: 2 %
HCT: 30.7 % — ABNORMAL LOW (ref 36.0–46.0)
Hemoglobin: 9.7 g/dL — ABNORMAL LOW (ref 12.0–15.0)
Immature Granulocytes: 1 %
Lymphocytes Relative: 15 %
Lymphs Abs: 0.6 10*3/uL — ABNORMAL LOW (ref 0.7–4.0)
MCH: 29.6 pg (ref 26.0–34.0)
MCHC: 31.6 g/dL (ref 30.0–36.0)
MCV: 93.6 fL (ref 80.0–100.0)
Monocytes Absolute: 0.3 10*3/uL (ref 0.1–1.0)
Monocytes Relative: 8 %
Neutro Abs: 2.7 10*3/uL (ref 1.7–7.7)
Neutrophils Relative %: 74 %
Platelet Count: 166 10*3/uL (ref 150–400)
RBC: 3.28 MIL/uL — ABNORMAL LOW (ref 3.87–5.11)
RDW: 17.2 % — ABNORMAL HIGH (ref 11.5–15.5)
WBC Count: 3.7 10*3/uL — ABNORMAL LOW (ref 4.0–10.5)
nRBC: 0 % (ref 0.0–0.2)
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
## 2022-12-04 NOTE — Telephone Encounter (Signed)
Pt's hemoglobin is 9.7. Called and spoke to pt to inform her that she does not need blood.   Please cancel Blood transfusion on 6/13

## 2022-12-05 ENCOUNTER — Encounter: Payer: Self-pay | Admitting: Family Medicine

## 2022-12-05 ENCOUNTER — Inpatient Hospital Stay: Payer: 59

## 2022-12-05 ENCOUNTER — Ambulatory Visit (INDEPENDENT_AMBULATORY_CARE_PROVIDER_SITE_OTHER): Payer: 59 | Admitting: Family Medicine

## 2022-12-05 VITALS — BP 120/72 | HR 91 | Resp 16 | Ht 67.0 in | Wt 251.0 lb

## 2022-12-05 DIAGNOSIS — E785 Hyperlipidemia, unspecified: Secondary | ICD-10-CM | POA: Diagnosis not present

## 2022-12-05 DIAGNOSIS — E1169 Type 2 diabetes mellitus with other specified complication: Secondary | ICD-10-CM

## 2022-12-05 DIAGNOSIS — C55 Malignant neoplasm of uterus, part unspecified: Secondary | ICD-10-CM

## 2022-12-05 DIAGNOSIS — E559 Vitamin D deficiency, unspecified: Secondary | ICD-10-CM | POA: Diagnosis not present

## 2022-12-05 DIAGNOSIS — E1129 Type 2 diabetes mellitus with other diabetic kidney complication: Secondary | ICD-10-CM | POA: Diagnosis not present

## 2022-12-05 DIAGNOSIS — M05742 Rheumatoid arthritis with rheumatoid factor of left hand without organ or systems involvement: Secondary | ICD-10-CM

## 2022-12-05 DIAGNOSIS — L4050 Arthropathic psoriasis, unspecified: Secondary | ICD-10-CM | POA: Diagnosis not present

## 2022-12-05 DIAGNOSIS — E890 Postprocedural hypothyroidism: Secondary | ICD-10-CM | POA: Diagnosis not present

## 2022-12-05 DIAGNOSIS — M05741 Rheumatoid arthritis with rheumatoid factor of right hand without organ or systems involvement: Secondary | ICD-10-CM

## 2022-12-05 DIAGNOSIS — Q752 Hypertelorism: Secondary | ICD-10-CM

## 2022-12-05 DIAGNOSIS — D61818 Other pancytopenia: Secondary | ICD-10-CM

## 2022-12-05 DIAGNOSIS — R809 Proteinuria, unspecified: Secondary | ICD-10-CM

## 2022-12-05 LAB — POCT GLYCOSYLATED HEMOGLOBIN (HGB A1C): Hemoglobin A1C: 4.7 % (ref 4.0–5.6)

## 2022-12-05 LAB — SAMPLE TO BLOOD BANK

## 2022-12-05 LAB — ERYTHROPOIETIN: Erythropoietin: 118.1 m[IU]/mL — ABNORMAL HIGH (ref 2.6–18.5)

## 2022-12-05 MED ORDER — ROSUVASTATIN CALCIUM 5 MG PO TABS: 5.0000 mg | ORAL_TABLET | Freq: Every morning | ORAL | 3 refills | Status: DC

## 2022-12-06 LAB — LIPID PANEL
Cholesterol: 126 mg/dL (ref <200)
HDL: 48 mg/dL — ABNORMAL LOW (ref >=50)
LDL Cholesterol (Calc): 55 mg/dL (calc)
Non-HDL Cholesterol (Calc): 78 mg/dL (calc) (ref <130)
Total CHOL/HDL Ratio: 2.6 (calc) (ref <5.0)
Triglycerides: 143 mg/dL (ref <150)

## 2022-12-06 LAB — MICROALBUMIN / CREATININE URINE RATIO
Creatinine, Urine: 173 mg/dL (ref 20–275)
Microalb Creat Ratio: 8 mg/g creat (ref <30)
Microalb, Ur: 1.4 mg/dL

## 2022-12-06 LAB — TSH: TSH: 0.18 mIU/L — ABNORMAL LOW (ref 0.40–4.50)

## 2022-12-17 ENCOUNTER — Inpatient Hospital Stay: Payer: 59

## 2022-12-17 DIAGNOSIS — D649 Anemia, unspecified: Secondary | ICD-10-CM

## 2022-12-17 DIAGNOSIS — Z8542 Personal history of malignant neoplasm of other parts of uterus: Secondary | ICD-10-CM | POA: Diagnosis not present

## 2022-12-17 LAB — CBC WITH DIFFERENTIAL (CANCER CENTER ONLY)
Abs Immature Granulocytes: 0.01 10*3/uL (ref 0.00–0.07)
Basophils Absolute: 0 10*3/uL (ref 0.0–0.1)
Basophils Relative: 0 %
Eosinophils Absolute: 0.1 10*3/uL (ref 0.0–0.5)
Eosinophils Relative: 2 %
HCT: 31.9 % — ABNORMAL LOW (ref 36.0–46.0)
Hemoglobin: 10.2 g/dL — ABNORMAL LOW (ref 12.0–15.0)
Immature Granulocytes: 0 %
Lymphocytes Relative: 21 %
Lymphs Abs: 0.6 10*3/uL — ABNORMAL LOW (ref 0.7–4.0)
MCH: 30 pg (ref 26.0–34.0)
MCHC: 32 g/dL (ref 30.0–36.0)
MCV: 93.8 fL (ref 80.0–100.0)
Monocytes Absolute: 0.3 10*3/uL (ref 0.1–1.0)
Monocytes Relative: 10 %
Neutro Abs: 1.9 10*3/uL (ref 1.7–7.7)
Neutrophils Relative %: 67 %
Platelet Count: 157 10*3/uL (ref 150–400)
RBC: 3.4 MIL/uL — ABNORMAL LOW (ref 3.87–5.11)
RDW: 16.7 % — ABNORMAL HIGH (ref 11.5–15.5)
WBC Count: 2.9 10*3/uL — ABNORMAL LOW (ref 4.0–10.5)
nRBC: 0 % (ref 0.0–0.2)

## 2022-12-17 LAB — SAMPLE TO BLOOD BANK

## 2022-12-21 ENCOUNTER — Other Ambulatory Visit: Payer: Self-pay | Admitting: Family Medicine

## 2022-12-21 DIAGNOSIS — E890 Postprocedural hypothyroidism: Secondary | ICD-10-CM

## 2023-01-13 ENCOUNTER — Other Ambulatory Visit: Payer: Self-pay

## 2023-01-13 DIAGNOSIS — C55 Malignant neoplasm of uterus, part unspecified: Secondary | ICD-10-CM

## 2023-01-13 DIAGNOSIS — D649 Anemia, unspecified: Secondary | ICD-10-CM

## 2023-01-14 ENCOUNTER — Inpatient Hospital Stay: Payer: 59 | Attending: Oncology

## 2023-01-14 DIAGNOSIS — D649 Anemia, unspecified: Secondary | ICD-10-CM | POA: Insufficient documentation

## 2023-01-14 DIAGNOSIS — Z8542 Personal history of malignant neoplasm of other parts of uterus: Secondary | ICD-10-CM | POA: Diagnosis present

## 2023-01-14 LAB — CBC WITH DIFFERENTIAL (CANCER CENTER ONLY)
Abs Immature Granulocytes: 0.01 10*3/uL (ref 0.00–0.07)
Basophils Absolute: 0 10*3/uL (ref 0.0–0.1)
Basophils Relative: 0 %
Eosinophils Absolute: 0.1 10*3/uL (ref 0.0–0.5)
Eosinophils Relative: 2 %
HCT: 31.5 % — ABNORMAL LOW (ref 36.0–46.0)
Hemoglobin: 9.9 g/dL — ABNORMAL LOW (ref 12.0–15.0)
Immature Granulocytes: 0 %
Lymphocytes Relative: 17 %
Lymphs Abs: 0.6 10*3/uL — ABNORMAL LOW (ref 0.7–4.0)
MCH: 29.7 pg (ref 26.0–34.0)
MCHC: 31.4 g/dL (ref 30.0–36.0)
MCV: 94.6 fL (ref 80.0–100.0)
Monocytes Absolute: 0.5 10*3/uL (ref 0.1–1.0)
Monocytes Relative: 13 %
Neutro Abs: 2.4 10*3/uL (ref 1.7–7.7)
Neutrophils Relative %: 68 %
Platelet Count: 148 10*3/uL — ABNORMAL LOW (ref 150–400)
RBC: 3.33 MIL/uL — ABNORMAL LOW (ref 3.87–5.11)
RDW: 15.8 % — ABNORMAL HIGH (ref 11.5–15.5)
WBC Count: 3.5 10*3/uL — ABNORMAL LOW (ref 4.0–10.5)
nRBC: 0 % (ref 0.0–0.2)

## 2023-01-14 LAB — SAMPLE TO BLOOD BANK

## 2023-01-16 ENCOUNTER — Telehealth: Payer: Self-pay

## 2023-01-16 NOTE — Telephone Encounter (Signed)
-----   Message from Rickard Patience sent at 01/16/2023  8:49 AM EDT ----- Stable blood counts. Please cancel lab on 8/27 keep same follow up appt

## 2023-01-16 NOTE — Telephone Encounter (Signed)
Called and spoke to patient and informed her that her blood counts are stable and that we are going to cancel her lab appointment on 8/27 and keep all of her other follow-ups.
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
## 2023-01-22 ENCOUNTER — Encounter: Payer: Self-pay | Admitting: Family Medicine

## 2023-01-22 ENCOUNTER — Ambulatory Visit (INDEPENDENT_AMBULATORY_CARE_PROVIDER_SITE_OTHER): Payer: 59 | Admitting: Family Medicine

## 2023-01-22 VITALS — BP 118/70 | HR 96 | Temp 97.9°F | Resp 16 | Ht 67.5 in | Wt 253.7 lb

## 2023-01-22 DIAGNOSIS — Q752 Hypertelorism: Secondary | ICD-10-CM | POA: Diagnosis not present

## 2023-01-22 DIAGNOSIS — E890 Postprocedural hypothyroidism: Secondary | ICD-10-CM | POA: Diagnosis not present

## 2023-01-22 DIAGNOSIS — Z8679 Personal history of other diseases of the circulatory system: Secondary | ICD-10-CM | POA: Diagnosis not present

## 2023-01-23 ENCOUNTER — Other Ambulatory Visit: Payer: Self-pay | Admitting: Family Medicine

## 2023-01-23 DIAGNOSIS — E890 Postprocedural hypothyroidism: Secondary | ICD-10-CM

## 2023-01-23 MED ORDER — LEVOTHYROXINE SODIUM 150 MCG PO TABS
150.0000 ug | ORAL_TABLET | Freq: Every day | ORAL | 0 refills | Status: DC
Start: 2023-01-23 — End: 2023-02-17

## 2023-02-10 ENCOUNTER — Telehealth: Payer: Self-pay | Admitting: *Deleted

## 2023-02-10 ENCOUNTER — Encounter: Payer: Self-pay | Admitting: *Deleted

## 2023-02-10 NOTE — Patient Instructions (Signed)
 Visit Information  Thank you for taking time to visit with me today. Please don't hesitate to contact me if I can be of assistance to you.   Please call the Suicide and Crisis Lifeline: 988 call the [place] National Suicide Prevention Lifeline: 8130983144 or TTY: 470-380-9009 TTY 718-772-4513) to talk to a trained counselor call 1-800-273-TALK (toll free, 24 hour hotline) call 911 if you are experiencing a Mental Health or Behavioral Health Crisis or need someone to talk to.  Patient verbalizes understanding of instructions and care plan provided today and agrees to view in MyChart. Active MyChart status and patient understanding of how to access instructions and care plan via MyChart confirmed with patient.     The patient has been provided with contact information for the care management team and has been advised to call with any health related questions or concerns.   Kemper Durie [facility] Care Management Care Management Coordinator [phone]

## 2023-02-10 NOTE — Patient Outreach (Signed)
  Care Coordination   Initial Visit Note   02/10/2023 Name: Taylor Perry MRN: [id number] DOB: [birth year]  Taylor Perry is a 72 y.o. year old female who sees Taylor Cory, MD for primary care. I spoke with  Taylor Perry by phone today.  What matters to the patients health and wellness today?  Remains stable, controlling chronic health conditions. Will call with questions/concerns    Goals Addressed             This Visit's Progress    COMPLETED: Care Coordination Activites - No follow up needed       Interventions Today    Flowsheet Row Most Recent Value  Chronic Disease   Chronic disease during today's visit Hypertension (HTN), Diabetes, Other  [HLD]  General Interventions   General Interventions Discussed/Reviewed General Interventions Reviewed, Vaccines, Health Screening, Doctor Visits  Vaccines Flu, Pneumonia  Doctor Visits Discussed/Reviewed Doctor Visits Reviewed, Annual Wellness Visits, PCP, Specialist  [Cancer center 9/11, PCP 11/13]  Health Screening Colonoscopy, Mammogram  PCP/Specialist Visits Compliance with follow-up visit  [AWV done in April this year]  Education Interventions   Education Provided Provided Education  Provided Verbal Education On Nutrition, Blood Sugar Monitoring, Labs, Medication, When to see the doctor  Labs Reviewed Hgb A1c  [below goal of 7]  Nutrition Interventions   Nutrition Discussed/Reviewed Nutrition Discussed, Carbohydrate meal planning, Adding fruits and vegetables, Decreasing fats, Decreasing sugar intake              SDOH assessments and interventions completed:  Yes  SDOH Interventions Today    Flowsheet Row Most Recent Value  SDOH Interventions   Food Insecurity Interventions Intervention Not Indicated  Housing Interventions Intervention Not Indicated  Transportation Interventions Intervention Not Indicated        Care Coordination Interventions:  Yes, provided   Follow up plan: No further  intervention required.   Encounter Outcome:  Pt. Visit Completed   Kemper Durie, RN, MSN, [facility] Care Management Care Management Coordinator [phone]

## 2023-02-12 ENCOUNTER — Telehealth: Payer: Self-pay | Admitting: Family Medicine

## 2023-02-12 NOTE — Telephone Encounter (Signed)
Sarah with [facility] has called and stated they need an MD approval for medication for Levothyroxine (SYNTHROID) 150 MCG table for patient. Per Maralyn Sago, they will be faxing a form today 02/12/2023. Form will need to be faxed back to # [phone]  Callback # [phone] REF # [id number]

## 2023-02-17 ENCOUNTER — Telehealth: Payer: Self-pay | Admitting: Family Medicine

## 2023-02-17 ENCOUNTER — Other Ambulatory Visit: Payer: Self-pay

## 2023-02-17 DIAGNOSIS — E890 Postprocedural hypothyroidism: Secondary | ICD-10-CM

## 2023-02-17 MED ORDER — LEVOTHYROXINE SODIUM 150 MCG PO TABS: 150.0000 ug | ORAL_TABLET | Freq: Every day | ORAL | 0 refills | Status: DC

## 2023-02-17 NOTE — Telephone Encounter (Signed)
Spoke to Pharmacist and they just needed refills. Tee'd up.Marland Kitchen

## 2023-02-17 NOTE — Telephone Encounter (Signed)
Copied from CRM [id number]. Topic: General - Other >> Feb 17, 2023  3:39 PM Ja-Kwan M wrote: Reason for CRM: Pt stated that her pharmacy asked that she have her pcp contact them at [phone] for an approval regarding the Rx for levothyroxine (SYNTHROID) 150 MCG tablet

## 2023-02-18 ENCOUNTER — Inpatient Hospital Stay: Payer: 59

## 2023-03-05 ENCOUNTER — Inpatient Hospital Stay: Payer: 59 | Attending: Oncology

## 2023-03-05 ENCOUNTER — Ambulatory Visit
Admission: RE | Admit: 2023-03-05 | Discharge: 2023-03-05 | Disposition: A | Discharge status: Home / Self Care | Payer: 59 | Source: Ambulatory Visit | Attending: Oncology | Admitting: Oncology

## 2023-03-05 DIAGNOSIS — D649 Anemia, unspecified: Secondary | ICD-10-CM | POA: Insufficient documentation

## 2023-03-05 DIAGNOSIS — Z9221 Personal history of antineoplastic chemotherapy: Secondary | ICD-10-CM | POA: Insufficient documentation

## 2023-03-05 DIAGNOSIS — Z90722 Acquired absence of ovaries, bilateral: Secondary | ICD-10-CM | POA: Diagnosis not present

## 2023-03-05 DIAGNOSIS — L4050 Arthropathic psoriasis, unspecified: Secondary | ICD-10-CM | POA: Insufficient documentation

## 2023-03-05 DIAGNOSIS — Z79899 Other long term (current) drug therapy: Secondary | ICD-10-CM | POA: Insufficient documentation

## 2023-03-05 DIAGNOSIS — C55 Malignant neoplasm of uterus, part unspecified: Secondary | ICD-10-CM | POA: Insufficient documentation

## 2023-03-05 DIAGNOSIS — Z90710 Acquired absence of both cervix and uterus: Secondary | ICD-10-CM | POA: Diagnosis not present

## 2023-03-05 DIAGNOSIS — Z8542 Personal history of malignant neoplasm of other parts of uterus: Secondary | ICD-10-CM | POA: Insufficient documentation

## 2023-03-05 LAB — IRON AND TIBC
Iron: 61 ug/dL (ref 28–170)
Saturation Ratios: 23 % (ref 10.4–31.8)
TIBC: 272 ug/dL (ref 250–450)
UIBC: 211 ug/dL

## 2023-03-05 LAB — RETIC PANEL
Immature Retic Fract: 19.4 % — ABNORMAL HIGH (ref 2.3–15.9)
RBC.: 3.55 MIL/uL — ABNORMAL LOW (ref 3.87–5.11)
Retic Count, Absolute: 69.2 10*3/uL (ref 19.0–186.0)
Retic Ct Pct: 2 % (ref 0.4–3.1)
Reticulocyte Hemoglobin: 29.5 pg (ref >27.9)

## 2023-03-05 LAB — CBC WITH DIFFERENTIAL (CANCER CENTER ONLY)
Abs Immature Granulocytes: 0.01 10*3/uL (ref 0.00–0.07)
Basophils Absolute: 0 10*3/uL (ref 0.0–0.1)
Basophils Relative: 0 %
Eosinophils Absolute: 0.1 10*3/uL (ref 0.0–0.5)
Eosinophils Relative: 1 %
HCT: 32.8 % — ABNORMAL LOW (ref 36.0–46.0)
Hemoglobin: 10.1 g/dL — ABNORMAL LOW (ref 12.0–15.0)
Immature Granulocytes: 0 %
Lymphocytes Relative: 17 %
Lymphs Abs: 0.6 10*3/uL — ABNORMAL LOW (ref 0.7–4.0)
MCH: 28.3 pg (ref 26.0–34.0)
MCHC: 30.8 g/dL (ref 30.0–36.0)
MCV: 91.9 fL (ref 80.0–100.0)
Monocytes Absolute: 0.4 10*3/uL (ref 0.1–1.0)
Monocytes Relative: 12 %
Neutro Abs: 2.4 10*3/uL (ref 1.7–7.7)
Neutrophils Relative %: 70 %
Platelet Count: 160 10*3/uL (ref 150–400)
RBC: 3.57 MIL/uL — ABNORMAL LOW (ref 3.87–5.11)
RDW: 15.5 % (ref 11.5–15.5)
WBC Count: 3.5 10*3/uL — ABNORMAL LOW (ref 4.0–10.5)
nRBC: 0 % (ref 0.0–0.2)

## 2023-03-05 LAB — CMP (CANCER CENTER ONLY)
ALT: 20 U/L (ref 0–44)
AST: 23 U/L (ref 15–41)
Albumin: 3.9 g/dL (ref 3.5–5.0)
Alkaline Phosphatase: 102 U/L (ref 38–126)
Anion gap: 5 (ref 5–15)
BUN: 14 mg/dL (ref 8–23)
CO2: 24 mmol/L (ref 22–32)
Calcium: 8.7 mg/dL — ABNORMAL LOW (ref 8.9–10.3)
Chloride: 105 mmol/L (ref 98–111)
Creatinine: 0.92 mg/dL (ref 0.44–1.00)
GFR, Estimated: >60 mL/min (ref >60)
Glucose, Bld: 107 mg/dL — ABNORMAL HIGH (ref 70–99)
Potassium: 3.9 mmol/L (ref 3.5–5.1)
Sodium: 134 mmol/L — ABNORMAL LOW (ref 135–145)
Total Bilirubin: 0.3 mg/dL (ref 0.3–1.2)
Total Protein: 7.7 g/dL (ref 6.5–8.1)

## 2023-03-05 LAB — SAMPLE TO BLOOD BANK

## 2023-03-05 LAB — FERRITIN: Ferritin: 536 ng/mL — ABNORMAL HIGH (ref 11–307)

## 2023-03-05 LAB — FOLATE: Folate: 21.7 ng/mL (ref >5.9)

## 2023-03-05 LAB — VITAMIN B12: Vitamin B-12: 790 pg/mL (ref 180–914)

## 2023-03-05 MED ORDER — IOHEXOL 300 MG/ML  SOLN
100.0000 mL | Freq: Once | INTRAMUSCULAR | Status: AC | PRN
  Administered 2023-03-05: 100 mL via INTRAVENOUS

## 2023-03-06 LAB — CA 125: Cancer Antigen (CA) 125: 5.7 U/mL (ref 0.0–38.1)

## 2023-03-11 ENCOUNTER — Encounter: Payer: Self-pay | Admitting: Oncology

## 2023-03-11 ENCOUNTER — Inpatient Hospital Stay (HOSPITAL_BASED_OUTPATIENT_CLINIC_OR_DEPARTMENT_OTHER): Payer: 59 | Admitting: Oncology

## 2023-03-11 VITALS — BP 137/88 | HR 89 | Temp 97.5°F | Resp 18 | Wt 256.0 lb

## 2023-03-11 DIAGNOSIS — L4050 Arthropathic psoriasis, unspecified: Secondary | ICD-10-CM

## 2023-03-11 DIAGNOSIS — C55 Malignant neoplasm of uterus, part unspecified: Secondary | ICD-10-CM

## 2023-03-11 DIAGNOSIS — Z79899 Other long term (current) drug therapy: Secondary | ICD-10-CM | POA: Diagnosis not present

## 2023-03-11 DIAGNOSIS — Z8542 Personal history of malignant neoplasm of other parts of uterus: Secondary | ICD-10-CM | POA: Diagnosis not present

## 2023-03-11 DIAGNOSIS — D649 Anemia, unspecified: Secondary | ICD-10-CM | POA: Diagnosis not present

## 2023-03-11 DIAGNOSIS — Z9221 Personal history of antineoplastic chemotherapy: Secondary | ICD-10-CM | POA: Diagnosis not present

## 2023-03-11 NOTE — Assessment & Plan Note (Signed)
Rheumatoid arthritis/Psoriatic arthritis  previously on Humira which is currently on hold. Manageable symptoms.  Continue follow-up with rheumatology

## 2023-03-11 NOTE — Assessment & Plan Note (Addendum)
#  Carcinosarcoma of uterus, stage IIIc, left SLN macrometastasis and positive vaginal margin.   S/p adjuvant chemotherapy, carboplatin/Taxol for 6 cycles [finished July 2023],  followed by external radiation and VBT. Labs reviewed and discussed once with patient. Sept  2024 CT scan showed NED Continue image surveillance.

## 2023-03-11 NOTE — Assessment & Plan Note (Addendum)
Hemoglobin has improved to be close to her baseline.  BM biopsy NGS showed PPMD1 mutation  Discussed with pathologist Dr. 

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
## 2023-03-12 ENCOUNTER — Encounter: Payer: Self-pay | Admitting: Radiation Oncology

## 2023-03-12 ENCOUNTER — Ambulatory Visit
Admission: RE | Admit: 2023-03-12 | Discharge: 2023-03-12 | Disposition: A | Discharge status: Home / Self Care | Payer: 59 | Source: Ambulatory Visit | Attending: Radiation Oncology | Admitting: Radiation Oncology

## 2023-03-12 VITALS — BP 134/82 | HR 83 | Temp 97.0°F | Ht 67.0 in | Wt 255.5 lb

## 2023-03-12 DIAGNOSIS — C541 Malignant neoplasm of endometrium: Secondary | ICD-10-CM

## 2023-03-12 NOTE — Progress Notes (Signed)
Radiation Oncology Follow up Note  Name: Taylor Perry   Date:   03/12/2023 MRN:  [id number] DOB: 07-02-1950    This 72 y.o. female presents to the clinic today for 1 year follow-up status post external beam radiation therapy as well as vaginal brachytherapy for stage IIIc (T3b N1 M0) carcinosarcoma of the uterus status post TAH/BSO and adjuvant chemotherapy.  REFERRING PROVIDER: Alba Cory, MD  HPI: Patient is a 72 year old female now out 1 year having completed external beam radiation therapy as well as vaginal brachytherapy in the adjuvant setting for patient with stage IIIc carcinosarcoma of the uterus status post TAH/BSO.  Seen today in routine follow-up she is doing well she is currently not under treatment.  She specifically denies any abdominal pain or discomfort any vaginal bleeding any increased lower urinary tract symptoms or diarrhea.  She had a recent CT scan of chest abdomen pelvis showing no evidence of disease..  COMPLICATIONS OF TREATMENT: none  FOLLOW UP COMPLIANCE: keeps appointments   PHYSICAL EXAM:  BP 134/82   Pulse 83   Temp (!) 97 F (36.1 C)   Ht 5\' 7"  (1.702 m)   Wt 255 lb 8 oz (115.9 kg)   BMI 40.02 kg/m  Well-developed well-nourished patient in NAD. HEENT reveals PERLA, EOMI, discs not visualized.  Oral cavity is clear. No oral mucosal lesions are identified. Neck is clear without evidence of cervical or supraclavicular adenopathy. Lungs are clear to A&P. Cardiac examination is essentially unremarkable with regular rate and rhythm without murmur rub or thrill. Abdomen is benign with no organomegaly or masses noted. Motor sensory and DTR levels are equal and symmetric in the upper and lower extremities. Cranial nerves II through XII are grossly intact. Proprioception is intact. No peripheral adenopathy or edema is identified. No motor or sensory levels are noted. Crude visual fields are within normal range.  RADIOLOGY RESULTS: CT scans reviewed  compatible with above-stated findings.  PLAN: Present time patient is now out over a year with no evidence of disease.  She continues close follow-up care with GYN oncology as well as medical oncology.  I am going to turn follow-up care over to them.  I be happy to reevaluate the patient in time should that be indicated.  I would like to take this opportunity to thank you for allowing me to participate in the care of your patient.Carmina Miller, MD

## 2023-03-20 DIAGNOSIS — Z79899 Other long term (current) drug therapy: Secondary | ICD-10-CM | POA: Diagnosis not present

## 2023-03-20 DIAGNOSIS — L4050 Arthropathic psoriasis, unspecified: Secondary | ICD-10-CM | POA: Diagnosis not present

## 2023-03-20 DIAGNOSIS — L409 Psoriasis, unspecified: Secondary | ICD-10-CM | POA: Diagnosis not present

## 2023-03-20 DIAGNOSIS — M059 Rheumatoid arthritis with rheumatoid factor, unspecified: Secondary | ICD-10-CM | POA: Diagnosis not present

## 2023-04-04 ENCOUNTER — Other Ambulatory Visit: Payer: 59

## 2023-04-23 ENCOUNTER — Other Ambulatory Visit: Payer: Self-pay | Admitting: Family Medicine

## 2023-04-23 DIAGNOSIS — E890 Postprocedural hypothyroidism: Secondary | ICD-10-CM

## 2023-05-07 ENCOUNTER — Ambulatory Visit: Payer: 59 | Admitting: Family Medicine
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
## 2023-05-08 ENCOUNTER — Encounter: Payer: Self-pay | Admitting: Family Medicine

## 2023-05-08 ENCOUNTER — Ambulatory Visit (INDEPENDENT_AMBULATORY_CARE_PROVIDER_SITE_OTHER): Payer: 59 | Admitting: Family Medicine

## 2023-05-08 VITALS — BP 112/72 | HR 96 | Temp 98.0°F | Resp 16 | Ht 67.5 in | Wt 257.9 lb

## 2023-05-08 DIAGNOSIS — M05742 Rheumatoid arthritis with rheumatoid factor of left hand without organ or systems involvement: Secondary | ICD-10-CM | POA: Diagnosis not present

## 2023-05-08 DIAGNOSIS — E890 Postprocedural hypothyroidism: Secondary | ICD-10-CM | POA: Diagnosis not present

## 2023-05-08 DIAGNOSIS — E1169 Type 2 diabetes mellitus with other specified complication: Secondary | ICD-10-CM | POA: Diagnosis not present

## 2023-05-08 DIAGNOSIS — E785 Hyperlipidemia, unspecified: Secondary | ICD-10-CM

## 2023-05-08 DIAGNOSIS — E1129 Type 2 diabetes mellitus with other diabetic kidney complication: Secondary | ICD-10-CM

## 2023-05-08 DIAGNOSIS — Z23 Encounter for immunization: Secondary | ICD-10-CM | POA: Diagnosis not present

## 2023-05-08 DIAGNOSIS — L4050 Arthropathic psoriasis, unspecified: Secondary | ICD-10-CM

## 2023-05-08 DIAGNOSIS — G620 Drug-induced polyneuropathy: Secondary | ICD-10-CM | POA: Diagnosis not present

## 2023-05-08 DIAGNOSIS — M05741 Rheumatoid arthritis with rheumatoid factor of right hand without organ or systems involvement: Secondary | ICD-10-CM | POA: Diagnosis not present

## 2023-05-08 DIAGNOSIS — R809 Proteinuria, unspecified: Secondary | ICD-10-CM | POA: Diagnosis not present

## 2023-05-08 LAB — POCT GLYCOSYLATED HEMOGLOBIN (HGB A1C): Hemoglobin A1C: 5.9 % — AB (ref 4.0–5.6)

## 2023-05-08 MED ORDER — LEVOTHYROXINE SODIUM 150 MCG PO TABS
150.0000 ug | ORAL_TABLET | Freq: Every day | ORAL | 1 refills | Status: DC
Start: 2023-05-08 — End: 2023-12-19

## 2023-05-09 ENCOUNTER — Other Ambulatory Visit: Payer: Self-pay

## 2023-06-11 ENCOUNTER — Inpatient Hospital Stay: Payer: 59 | Attending: Oncology | Admitting: Obstetrics and Gynecology

## 2023-06-11 VITALS — BP 141/80 | HR 86 | Temp 96.9°F | Resp 20 | Wt 258.2 lb

## 2023-06-11 DIAGNOSIS — Z9221 Personal history of antineoplastic chemotherapy: Secondary | ICD-10-CM | POA: Diagnosis not present

## 2023-06-11 DIAGNOSIS — D649 Anemia, unspecified: Secondary | ICD-10-CM | POA: Diagnosis not present

## 2023-06-11 DIAGNOSIS — Z923 Personal history of irradiation: Secondary | ICD-10-CM | POA: Diagnosis not present

## 2023-06-11 DIAGNOSIS — Z08 Encounter for follow-up examination after completed treatment for malignant neoplasm: Secondary | ICD-10-CM | POA: Insufficient documentation

## 2023-06-11 DIAGNOSIS — Z90710 Acquired absence of both cervix and uterus: Secondary | ICD-10-CM | POA: Insufficient documentation

## 2023-06-11 DIAGNOSIS — Z90722 Acquired absence of ovaries, bilateral: Secondary | ICD-10-CM | POA: Insufficient documentation

## 2023-06-11 DIAGNOSIS — Z8542 Personal history of malignant neoplasm of other parts of uterus: Secondary | ICD-10-CM | POA: Insufficient documentation

## 2023-06-11 DIAGNOSIS — Z9079 Acquired absence of other genital organ(s): Secondary | ICD-10-CM | POA: Insufficient documentation

## 2023-06-11 DIAGNOSIS — C55 Malignant neoplasm of uterus, part unspecified: Secondary | ICD-10-CM
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
## 2023-06-12 ENCOUNTER — Other Ambulatory Visit: Payer: Self-pay

## 2023-07-22 DIAGNOSIS — L4050 Arthropathic psoriasis, unspecified: Secondary | ICD-10-CM | POA: Diagnosis not present

## 2023-07-22 DIAGNOSIS — L409 Psoriasis, unspecified: Secondary | ICD-10-CM | POA: Diagnosis not present

## 2023-07-22 DIAGNOSIS — Z79899 Other long term (current) drug therapy: Secondary | ICD-10-CM | POA: Diagnosis not present

## 2023-08-14 ENCOUNTER — Other Ambulatory Visit: Payer: Self-pay | Admitting: Family Medicine

## 2023-08-14 DIAGNOSIS — E1169 Type 2 diabetes mellitus with other specified complication: Secondary | ICD-10-CM

## 2023-08-21 DIAGNOSIS — H26112 Localized traumatic opacities, left eye: Secondary | ICD-10-CM | POA: Diagnosis not present

## 2023-08-21 DIAGNOSIS — H25011 Cortical age-related cataract, right eye: Secondary | ICD-10-CM | POA: Diagnosis not present

## 2023-08-21 DIAGNOSIS — H11002 Unspecified pterygium of left eye: Secondary | ICD-10-CM | POA: Diagnosis not present

## 2023-08-21 DIAGNOSIS — H18422 Band keratopathy, left eye: Secondary | ICD-10-CM | POA: Diagnosis not present

## 2023-08-21 LAB — HM DIABETES EYE EXAM

## 2023-09-08 ENCOUNTER — Inpatient Hospital Stay: Payer: 59 | Attending: Oncology

## 2023-09-08 ENCOUNTER — Ambulatory Visit
Admission: RE | Admit: 2023-09-08 | Discharge: 2023-09-08 | Disposition: A | Discharge status: Home / Self Care | Payer: 59 | Source: Ambulatory Visit | Attending: Oncology | Admitting: Oncology

## 2023-09-08 DIAGNOSIS — D649 Anemia, unspecified: Secondary | ICD-10-CM | POA: Insufficient documentation

## 2023-09-08 DIAGNOSIS — C55 Malignant neoplasm of uterus, part unspecified: Secondary | ICD-10-CM

## 2023-09-08 DIAGNOSIS — Z923 Personal history of irradiation: Secondary | ICD-10-CM | POA: Diagnosis not present

## 2023-09-08 DIAGNOSIS — Z8542 Personal history of malignant neoplasm of other parts of uterus: Secondary | ICD-10-CM | POA: Diagnosis present

## 2023-09-08 DIAGNOSIS — Z808 Family history of malignant neoplasm of other organs or systems: Secondary | ICD-10-CM | POA: Diagnosis not present

## 2023-09-08 DIAGNOSIS — L4050 Arthropathic psoriasis, unspecified: Secondary | ICD-10-CM | POA: Diagnosis not present

## 2023-09-08 DIAGNOSIS — Z9221 Personal history of antineoplastic chemotherapy: Secondary | ICD-10-CM | POA: Diagnosis not present

## 2023-09-08 LAB — CMP (CANCER CENTER ONLY)
ALT: 18 U/L (ref 0–44)
AST: 22 U/L (ref 15–41)
Albumin: 3.7 g/dL (ref 3.5–5.0)
Alkaline Phosphatase: 85 U/L (ref 38–126)
Anion gap: 7 (ref 5–15)
BUN: 14 mg/dL (ref 8–23)
CO2: 26 mmol/L (ref 22–32)
Calcium: 8.8 mg/dL — ABNORMAL LOW (ref 8.9–10.3)
Chloride: 103 mmol/L (ref 98–111)
Creatinine: 0.88 mg/dL (ref 0.44–1.00)
GFR, Estimated: >60 mL/min (ref >60)
Glucose, Bld: 113 mg/dL — ABNORMAL HIGH (ref 70–99)
Potassium: 3.6 mmol/L (ref 3.5–5.1)
Sodium: 136 mmol/L (ref 135–145)
Total Bilirubin: 0.5 mg/dL (ref 0.0–1.2)
Total Protein: 7.4 g/dL (ref 6.5–8.1)

## 2023-09-08 LAB — IRON AND TIBC
Iron: 57 ug/dL (ref 28–170)
Saturation Ratios: 21 % (ref 10.4–31.8)
TIBC: 273 ug/dL (ref 250–450)
UIBC: 216 ug/dL

## 2023-09-08 LAB — CBC WITH DIFFERENTIAL (CANCER CENTER ONLY)
Abs Immature Granulocytes: 0.01 10*3/uL (ref 0.00–0.07)
Basophils Absolute: 0 10*3/uL (ref 0.0–0.1)
Basophils Relative: 0 %
Eosinophils Absolute: 0.1 10*3/uL (ref 0.0–0.5)
Eosinophils Relative: 2 %
HCT: 32.2 % — ABNORMAL LOW (ref 36.0–46.0)
Hemoglobin: 10.2 g/dL — ABNORMAL LOW (ref 12.0–15.0)
Immature Granulocytes: 0 %
Lymphocytes Relative: 22 %
Lymphs Abs: 0.7 10*3/uL (ref 0.7–4.0)
MCH: 28.3 pg (ref 26.0–34.0)
MCHC: 31.7 g/dL (ref 30.0–36.0)
MCV: 89.2 fL (ref 80.0–100.0)
Monocytes Absolute: 0.5 10*3/uL (ref 0.1–1.0)
Monocytes Relative: 15 %
Neutro Abs: 2 10*3/uL (ref 1.7–7.7)
Neutrophils Relative %: 61 %
Platelet Count: 164 10*3/uL (ref 150–400)
RBC: 3.61 MIL/uL — ABNORMAL LOW (ref 3.87–5.11)
RDW: 15.8 % — ABNORMAL HIGH (ref 11.5–15.5)
WBC Count: 3.2 10*3/uL — ABNORMAL LOW (ref 4.0–10.5)
nRBC: 0 % (ref 0.0–0.2)

## 2023-09-08 LAB — RETIC PANEL
Immature Retic Fract: 14.6 % (ref 2.3–15.9)
RBC.: 3.62 MIL/uL — ABNORMAL LOW (ref 3.87–5.11)
Retic Count, Absolute: 60.5 10*3/uL (ref 19.0–186.0)
Retic Ct Pct: 1.7 % (ref 0.4–3.1)
Reticulocyte Hemoglobin: 29.1 pg (ref >27.9)

## 2023-09-08 LAB — FERRITIN: Ferritin: 497 ng/mL — ABNORMAL HIGH (ref 11–307)

## 2023-09-08 MED ORDER — IOHEXOL 300 MG/ML  SOLN
100.0000 mL | Freq: Once | INTRAMUSCULAR | Status: AC | PRN
  Administered 2023-09-08: 100 mL via INTRAVENOUS

## 2023-09-09 ENCOUNTER — Other Ambulatory Visit: Payer: 59

## 2023-09-09 LAB — CA 125: Cancer Antigen (CA) 125: 5 U/mL (ref 0.0–38.1)

## 2023-09-15 ENCOUNTER — Other Ambulatory Visit: Payer: Self-pay | Admitting: Family Medicine

## 2023-09-15 ENCOUNTER — Inpatient Hospital Stay (HOSPITAL_BASED_OUTPATIENT_CLINIC_OR_DEPARTMENT_OTHER): Payer: 59 | Admitting: Oncology

## 2023-09-15 ENCOUNTER — Encounter: Payer: Self-pay | Admitting: Oncology

## 2023-09-15 VITALS — BP 118/81 | HR 87 | Temp 96.4°F | Resp 16 | Ht 67.5 in | Wt 253.2 lb

## 2023-09-15 DIAGNOSIS — L4050 Arthropathic psoriasis, unspecified: Secondary | ICD-10-CM | POA: Diagnosis not present

## 2023-09-15 DIAGNOSIS — C55 Malignant neoplasm of uterus, part unspecified: Secondary | ICD-10-CM

## 2023-09-15 DIAGNOSIS — E890 Postprocedural hypothyroidism: Secondary | ICD-10-CM

## 2023-09-15 DIAGNOSIS — D649 Anemia, unspecified: Secondary | ICD-10-CM | POA: Diagnosis not present

## 2023-09-15 DIAGNOSIS — Z8542 Personal history of malignant neoplasm of other parts of uterus: Secondary | ICD-10-CM | POA: Diagnosis not present

## 2023-09-15 NOTE — Assessment & Plan Note (Addendum)
#  Carcinosarcoma of uterus, stage IIIc, left SLN macrometastasis and positive vaginal margin.   S/p adjuvant chemotherapy, carboplatin/Taxol for 6 cycles [finished July 2023],  followed by external radiation and VBT. Labs reviewed and discussed once with patient. 09/08/2023 CT scan showed NED Continue image surveillance.

## 2023-09-15 NOTE — Assessment & Plan Note (Addendum)
 Rheumatoid arthritis/Psoriatic arthritis  currently on Otezla Continue follow-up with rheumatology

## 2023-09-15 NOTE — Assessment & Plan Note (Addendum)
 Hemoglobin remains stable.  

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
## 2023-09-15 NOTE — Progress Notes (Signed)
 Patient denies new or acute problems/concerns today.

## 2023-09-16 ENCOUNTER — Other Ambulatory Visit: Payer: Self-pay

## 2023-09-16 LAB — HM DIABETES FOOT EXAM: HM Diabetic Foot Exam: NORMAL

## 2023-09-16 LAB — HEMOGLOBIN A1C: Hemoglobin A1C: 5.1

## 2023-10-23 ENCOUNTER — Other Ambulatory Visit: Payer: Self-pay | Admitting: Family Medicine

## 2023-10-23 DIAGNOSIS — E1169 Type 2 diabetes mellitus with other specified complication: Secondary | ICD-10-CM

## 2023-10-30 ENCOUNTER — Ambulatory Visit: Payer: Self-pay

## 2023-10-30 DIAGNOSIS — Z0000 Encounter for general adult medical examination without abnormal findings: Secondary | ICD-10-CM | POA: Diagnosis not present

## 2023-10-30 DIAGNOSIS — Z1231 Encounter for screening mammogram for malignant neoplasm of breast: Secondary | ICD-10-CM

## 2023-10-30 NOTE — Patient Instructions (Addendum)
 Taylor Perry , Thank you for taking time to come for your Medicare Wellness Visit. I appreciate your ongoing commitment to your health goals. Please review the following plan we discussed and let me know if I can assist you in the future.   Referrals/Orders/Follow-Ups/Clinician Recommendations: MAMMOGRAM REFERRAL SENT  You have an order for:  []   2D Mammogram  [x]   3D Mammogram  []   Bone Density     Please call for appointment:  [facility]  [address] [place] [address] [phone] [facility] [address] [place], [place] [address] [phone] [facility] [address] [place]acey [place], [place] [address] [phone]   Make sure to wear two-piece clothing.  No lotions, powders, or deodorants the day of the appointment. Make sure to bring picture ID and insurance card.  Bring list of medications you are currently taking including any supplements.   Schedule your [place] screening mammogram through MyChart!   Log into your MyChart account.  Go to 'Visit' (or 'Appointments' if on mobile App) --> Schedule an Appointment  Under 'Select a Reason for Visit' choose the Mammogram Screening option.  Complete the pre-visit questions and select the time and place that best fits your schedule.   This is a list of the screening recommended for you and due dates:  Health Maintenance  Topic Date Due   Zoster (Shingles) Vaccine (1 of 2) 11/13/1969   COVID-19 Vaccine (5 - 2024-25 season) 02/23/2023   Mammogram  03/28/2023   Yearly kidney health urinalysis for diabetes  12/05/2023   Flu Shot  01/23/2024   Hemoglobin A1C  03/18/2024   Eye exam for diabetics  08/20/2024   Cologuard (Stool DNA test)  09/06/2024   Yearly kidney function blood test for diabetes  09/07/2024   Complete foot exam   09/15/2024   Medicare Annual Wellness Visit  10/29/2024   DEXA  scan (bone density measurement)  03/28/2027   DTaP/Tdap/Td vaccine (3 - Td or Tdap) 11/18/2032   Pneumonia Vaccine  Completed   Hepatitis C Screening  Completed   HPV Vaccine  Aged Out   Meningitis B Vaccine  Aged Out   Colon Cancer Screening  Discontinued    Advanced directives: (ACP Link)Information on Advanced Care Planning can be found at [place]  Secretary of [facility] Advance Health Care Directives Advance Health Care Directives. [email or web address]   Next Medicare Annual Wellness Visit scheduled for next year: Yes   11/18/24 @ 8:50 AM BY PHONE  Have you seen your provider in the last 6 months (3 months if uncontrolled diabetes)? Yes
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
## 2023-10-31 ENCOUNTER — Other Ambulatory Visit: Payer: Self-pay

## 2023-11-05 ENCOUNTER — Encounter: Payer: Self-pay | Admitting: Family Medicine

## 2023-11-05 ENCOUNTER — Ambulatory Visit (INDEPENDENT_AMBULATORY_CARE_PROVIDER_SITE_OTHER): Payer: Self-pay | Admitting: Family Medicine

## 2023-11-05 VITALS — BP 130/76 | HR 99 | Resp 16 | Ht 67.5 in | Wt 251.3 lb

## 2023-11-05 DIAGNOSIS — E785 Hyperlipidemia, unspecified: Secondary | ICD-10-CM

## 2023-11-05 DIAGNOSIS — L4050 Arthropathic psoriasis, unspecified: Secondary | ICD-10-CM

## 2023-11-05 DIAGNOSIS — M05741 Rheumatoid arthritis with rheumatoid factor of right hand without organ or systems involvement: Secondary | ICD-10-CM

## 2023-11-05 DIAGNOSIS — E1169 Type 2 diabetes mellitus with other specified complication: Secondary | ICD-10-CM | POA: Diagnosis not present

## 2023-11-05 DIAGNOSIS — M05742 Rheumatoid arthritis with rheumatoid factor of left hand without organ or systems involvement: Secondary | ICD-10-CM

## 2023-11-05 DIAGNOSIS — E890 Postprocedural hypothyroidism: Secondary | ICD-10-CM

## 2023-11-05 DIAGNOSIS — E559 Vitamin D deficiency, unspecified: Secondary | ICD-10-CM

## 2023-11-05 DIAGNOSIS — C775 Secondary and unspecified malignant neoplasm of intrapelvic lymph nodes: Secondary | ICD-10-CM

## 2023-11-05 DIAGNOSIS — T451X5A Adverse effect of antineoplastic and immunosuppressive drugs, initial encounter: Secondary | ICD-10-CM

## 2023-11-05 DIAGNOSIS — G620 Drug-induced polyneuropathy: Secondary | ICD-10-CM

## 2023-11-05 DIAGNOSIS — C55 Malignant neoplasm of uterus, part unspecified: Secondary | ICD-10-CM
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
## 2023-11-06 ENCOUNTER — Ambulatory Visit: Payer: Self-pay | Admitting: Family Medicine

## 2023-11-06 LAB — LIPID PANEL
Cholesterol: 117 mg/dL (ref <200)
HDL: 45 mg/dL — ABNORMAL LOW (ref >=50)
LDL Cholesterol (Calc): 54 mg/dL
Non-HDL Cholesterol (Calc): 72 mg/dL (ref <130)
Total CHOL/HDL Ratio: 2.6 (calc) (ref <5.0)
Triglycerides: 98 mg/dL (ref <150)

## 2023-11-06 LAB — EXTRA

## 2023-11-06 LAB — TSH: TSH: 0.19 m[IU]/L — ABNORMAL LOW (ref 0.40–4.50)

## 2023-11-06 LAB — MICROALBUMIN / CREATININE URINE RATIO
Creatinine, Urine: 93 mg/dL (ref 20–275)
Microalb Creat Ratio: 2 mg/g{creat} (ref <30)
Microalb, Ur: 0.2 mg/dL

## 2023-11-06 LAB — VITAMIN D 25 HYDROXY (VIT D DEFICIENCY, FRACTURES): Vit D, 25-Hydroxy: 38 ng/mL (ref 30–100)

## 2023-11-07 ENCOUNTER — Other Ambulatory Visit: Payer: Self-pay | Admitting: Family Medicine

## 2023-11-07 DIAGNOSIS — E890 Postprocedural hypothyroidism: Secondary | ICD-10-CM

## 2023-12-10 ENCOUNTER — Inpatient Hospital Stay: Payer: 59 | Attending: Obstetrics and Gynecology | Admitting: Obstetrics and Gynecology

## 2023-12-10 VITALS — BP 143/75 | HR 88 | Temp 97.6°F | Resp 20 | Wt 250.8 lb

## 2023-12-10 DIAGNOSIS — Z8542 Personal history of malignant neoplasm of other parts of uterus: Secondary | ICD-10-CM | POA: Diagnosis present

## 2023-12-10 DIAGNOSIS — Z08 Encounter for follow-up examination after completed treatment for malignant neoplasm: Secondary | ICD-10-CM

## 2023-12-10 DIAGNOSIS — C55 Malignant neoplasm of uterus, part unspecified: Secondary | ICD-10-CM | POA: Diagnosis not present
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
## 2023-12-16 ENCOUNTER — Other Ambulatory Visit: Payer: Self-pay | Admitting: Family Medicine

## 2023-12-16 DIAGNOSIS — E890 Postprocedural hypothyroidism: Secondary | ICD-10-CM

## 2023-12-16 DIAGNOSIS — E1169 Type 2 diabetes mellitus with other specified complication: Secondary | ICD-10-CM

## 2023-12-18 ENCOUNTER — Ambulatory Visit: Payer: Self-pay | Admitting: Family Medicine

## 2023-12-18 LAB — TSH: TSH: 0.22 m[IU]/L — ABNORMAL LOW (ref 0.40–4.50)

## 2023-12-19 ENCOUNTER — Other Ambulatory Visit: Payer: Self-pay

## 2023-12-19 DIAGNOSIS — E890 Postprocedural hypothyroidism: Secondary | ICD-10-CM

## 2023-12-19 MED ORDER — LEVOTHYROXINE SODIUM 150 MCG PO TABS: 150.0000 ug | ORAL_TABLET | Freq: Every day | ORAL | 0 refills | Status: DC

## 2024-01-29 ENCOUNTER — Other Ambulatory Visit: Payer: Self-pay

## 2024-01-29 DIAGNOSIS — E890 Postprocedural hypothyroidism: Secondary | ICD-10-CM

## 2024-01-29 LAB — EXTRA: SPECIMEN TYPE RECEIVED:: 1

## 2024-01-29 LAB — TSH: TSH: 0.26 m[IU]/L — ABNORMAL LOW (ref 0.40–4.50)

## 2024-02-01 ENCOUNTER — Ambulatory Visit: Payer: Self-pay | Admitting: Family Medicine

## 2024-02-03 ENCOUNTER — Other Ambulatory Visit: Payer: Self-pay | Admitting: Family Medicine

## 2024-02-03 DIAGNOSIS — E890 Postprocedural hypothyroidism: Secondary | ICD-10-CM

## 2024-02-03 MED ORDER — LEVOTHYROXINE SODIUM 125 MCG PO TABS
125.0000 ug | ORAL_TABLET | Freq: Every day | ORAL | 0 refills | Status: DC
Start: 2024-02-03 — End: 2024-04-26

## 2024-03-04 ENCOUNTER — Other Ambulatory Visit: Payer: Self-pay | Admitting: Family Medicine

## 2024-03-04 DIAGNOSIS — E1169 Type 2 diabetes mellitus with other specified complication: Secondary | ICD-10-CM

## 2024-03-17 ENCOUNTER — Other Ambulatory Visit: Payer: Self-pay

## 2024-03-17 ENCOUNTER — Ambulatory Visit
Admission: RE | Admit: 2024-03-17 | Discharge: 2024-03-17 | Disposition: A | Discharge status: Home / Self Care | Source: Ambulatory Visit | Attending: Oncology | Admitting: Oncology

## 2024-03-17 ENCOUNTER — Inpatient Hospital Stay: Attending: Obstetrics and Gynecology

## 2024-03-17 DIAGNOSIS — C55 Malignant neoplasm of uterus, part unspecified: Secondary | ICD-10-CM | POA: Diagnosis present

## 2024-03-17 DIAGNOSIS — Z8542 Personal history of malignant neoplasm of other parts of uterus: Secondary | ICD-10-CM | POA: Insufficient documentation

## 2024-03-17 DIAGNOSIS — E890 Postprocedural hypothyroidism: Secondary | ICD-10-CM

## 2024-03-17 DIAGNOSIS — D649 Anemia, unspecified: Secondary | ICD-10-CM | POA: Insufficient documentation

## 2024-03-17 LAB — CBC WITH DIFFERENTIAL (CANCER CENTER ONLY)
Abs Immature Granulocytes: 0.01 K/uL (ref 0.00–0.07)
Basophils Absolute: 0 K/uL (ref 0.0–0.1)
Basophils Relative: 0 %
Eosinophils Absolute: 0 K/uL (ref 0.0–0.5)
Eosinophils Relative: 1 %
HCT: 34.3 % — ABNORMAL LOW (ref 36.0–46.0)
Hemoglobin: 10.7 g/dL — ABNORMAL LOW (ref 12.0–15.0)
Immature Granulocytes: 0 %
Lymphocytes Relative: 25 %
Lymphs Abs: 1.1 K/uL (ref 0.7–4.0)
MCH: 27.3 pg (ref 26.0–34.0)
MCHC: 31.2 g/dL (ref 30.0–36.0)
MCV: 87.5 fL (ref 80.0–100.0)
Monocytes Absolute: 0.6 K/uL (ref 0.1–1.0)
Monocytes Relative: 14 %
Neutro Abs: 2.5 K/uL (ref 1.7–7.7)
Neutrophils Relative %: 60 %
Platelet Count: 156 K/uL (ref 150–400)
RBC: 3.92 MIL/uL (ref 3.87–5.11)
RDW: 15.5 % (ref 11.5–15.5)
WBC Count: 4.2 K/uL (ref 4.0–10.5)
nRBC: 0 % (ref 0.0–0.2)

## 2024-03-17 LAB — CMP (CANCER CENTER ONLY)
ALT: 16 U/L (ref 0–44)
AST: 21 U/L (ref 15–41)
Albumin: 3.9 g/dL (ref 3.5–5.0)
Alkaline Phosphatase: 84 U/L (ref 38–126)
Anion gap: 9 (ref 5–15)
BUN: 15 mg/dL (ref 8–23)
CO2: 23 mmol/L (ref 22–32)
Calcium: 9.1 mg/dL (ref 8.9–10.3)
Chloride: 106 mmol/L (ref 98–111)
Creatinine: 1.03 mg/dL — ABNORMAL HIGH (ref 0.44–1.00)
GFR, Estimated: 58 mL/min — ABNORMAL LOW (ref >60)
Glucose, Bld: 103 mg/dL — ABNORMAL HIGH (ref 70–99)
Potassium: 3.7 mmol/L (ref 3.5–5.1)
Sodium: 138 mmol/L (ref 135–145)
Total Bilirubin: 0.5 mg/dL (ref 0.0–1.2)
Total Protein: 7.8 g/dL (ref 6.5–8.1)

## 2024-03-17 LAB — RETIC PANEL
Immature Retic Fract: 17.4 % — ABNORMAL HIGH (ref 2.3–15.9)
RBC.: 3.9 MIL/uL (ref 3.87–5.11)
Retic Count, Absolute: 60.8 K/uL (ref 19.0–186.0)
Retic Ct Pct: 1.6 % (ref 0.4–3.1)
Reticulocyte Hemoglobin: 31 pg (ref >27.9)

## 2024-03-17 LAB — FOLATE: Folate: >20 ng/mL (ref >5.9)

## 2024-03-17 LAB — TSH: TSH: 0.64 m[IU]/L (ref 0.40–4.50)

## 2024-03-17 LAB — VITAMIN B12: Vitamin B-12: 1885 pg/mL — ABNORMAL HIGH (ref 180–914)

## 2024-03-17 MED ORDER — IOHEXOL 300 MG/ML  SOLN
100.0000 mL | Freq: Once | INTRAMUSCULAR | Status: AC | PRN
  Administered 2024-03-17: 100 mL via INTRAVENOUS

## 2024-03-18 ENCOUNTER — Ambulatory Visit: Payer: Self-pay | Admitting: Family Medicine

## 2024-03-18 LAB — CA 125: Cancer Antigen (CA) 125: 5.8 U/mL (ref 0.0–38.1)

## 2024-03-26 ENCOUNTER — Inpatient Hospital Stay: Attending: Obstetrics and Gynecology | Admitting: Oncology

## 2024-03-26 ENCOUNTER — Inpatient Hospital Stay

## 2024-03-26 ENCOUNTER — Encounter: Payer: Self-pay | Admitting: Oncology

## 2024-03-26 VITALS — BP 135/76 | HR 86 | Temp 96.1°F | Resp 18 | Ht 67.5 in | Wt 249.0 lb

## 2024-03-26 DIAGNOSIS — Z23 Encounter for immunization: Secondary | ICD-10-CM | POA: Diagnosis not present

## 2024-03-26 DIAGNOSIS — E785 Hyperlipidemia, unspecified: Secondary | ICD-10-CM | POA: Diagnosis not present

## 2024-03-26 DIAGNOSIS — I10 Essential (primary) hypertension: Secondary | ICD-10-CM | POA: Diagnosis not present

## 2024-03-26 DIAGNOSIS — L4050 Arthropathic psoriasis, unspecified: Secondary | ICD-10-CM | POA: Diagnosis not present

## 2024-03-26 DIAGNOSIS — Z90710 Acquired absence of both cervix and uterus: Secondary | ICD-10-CM | POA: Insufficient documentation

## 2024-03-26 DIAGNOSIS — R911 Solitary pulmonary nodule: Secondary | ICD-10-CM | POA: Insufficient documentation

## 2024-03-26 DIAGNOSIS — Z8542 Personal history of malignant neoplasm of other parts of uterus: Secondary | ICD-10-CM | POA: Insufficient documentation

## 2024-03-26 DIAGNOSIS — K8020 Calculus of gallbladder without cholecystitis without obstruction: Secondary | ICD-10-CM | POA: Insufficient documentation

## 2024-03-26 DIAGNOSIS — Z809 Family history of malignant neoplasm, unspecified: Secondary | ICD-10-CM | POA: Insufficient documentation

## 2024-03-26 DIAGNOSIS — E559 Vitamin D deficiency, unspecified: Secondary | ICD-10-CM | POA: Insufficient documentation

## 2024-03-26 DIAGNOSIS — G620 Drug-induced polyneuropathy: Secondary | ICD-10-CM | POA: Diagnosis not present

## 2024-03-26 DIAGNOSIS — E119 Type 2 diabetes mellitus without complications: Secondary | ICD-10-CM | POA: Insufficient documentation

## 2024-03-26 DIAGNOSIS — C55 Malignant neoplasm of uterus, part unspecified: Secondary | ICD-10-CM

## 2024-03-26 DIAGNOSIS — Z79890 Hormone replacement therapy (postmenopausal): Secondary | ICD-10-CM | POA: Insufficient documentation

## 2024-03-26 DIAGNOSIS — D649 Anemia, unspecified: Secondary | ICD-10-CM | POA: Diagnosis not present

## 2024-03-26 DIAGNOSIS — R918 Other nonspecific abnormal finding of lung field: Secondary | ICD-10-CM | POA: Diagnosis not present

## 2024-03-26 DIAGNOSIS — T451X5A Adverse effect of antineoplastic and immunosuppressive drugs, initial encounter: Secondary | ICD-10-CM | POA: Insufficient documentation

## 2024-03-26 DIAGNOSIS — Z79899 Other long term (current) drug therapy: Secondary | ICD-10-CM | POA: Diagnosis not present

## 2024-03-26 DIAGNOSIS — M1710 Unilateral primary osteoarthritis, unspecified knee: Secondary | ICD-10-CM | POA: Diagnosis not present

## 2024-03-26 DIAGNOSIS — R5382 Chronic fatigue, unspecified: Secondary | ICD-10-CM | POA: Diagnosis not present

## 2024-03-26 DIAGNOSIS — K449 Diaphragmatic hernia without obstruction or gangrene: Secondary | ICD-10-CM | POA: Diagnosis not present

## 2024-03-26 DIAGNOSIS — Z90722 Acquired absence of ovaries, bilateral: Secondary | ICD-10-CM | POA: Insufficient documentation

## 2024-03-26 DIAGNOSIS — M069 Rheumatoid arthritis, unspecified: Secondary | ICD-10-CM | POA: Insufficient documentation

## 2024-03-26 DIAGNOSIS — R5383 Other fatigue: Secondary | ICD-10-CM | POA: Diagnosis not present

## 2024-03-26 MED ORDER — INFLUENZA VAC SPLIT HIGH-DOSE 0.5 ML IM SUSY
0.5000 mL | PREFILLED_SYRINGE | Freq: Once | INTRAMUSCULAR | Status: AC
  Administered 2024-03-26: 0.5 mL via INTRAMUSCULAR
  Filled 2024-03-26: qty 0.5

## 2024-03-26 NOTE — Assessment & Plan Note (Signed)
 Rheumatoid arthritis/Psoriatic arthritis  currently on Otezla Continue follow-up with rheumatology

## 2024-03-26 NOTE — Assessment & Plan Note (Addendum)
#  Carcinosarcoma of uterus, stage IIIc, s/p surgery Feb 2023. left SLN macrometastasis and positive vaginal margin.   S/p adjuvant chemotherapy, carboplatin /Taxol  for 6 cycles [finished July 2023],  followed by external radiation and VBT. Labs reviewed and discussed once with patient. 03/18/2024 CT scan showed NED, new lung nodule 5mm, repeat CT chest wo contrast in 3 months

## 2024-03-27 ENCOUNTER — Encounter: Payer: Self-pay | Admitting: Oncology

## 2024-03-27 ENCOUNTER — Ambulatory Visit: Payer: Self-pay | Admitting: Oncology

## 2024-03-27 NOTE — Assessment & Plan Note (Signed)
 Hemoglobin remains stable.  

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
## 2024-03-29 ENCOUNTER — Other Ambulatory Visit: Payer: Self-pay | Admitting: Family Medicine

## 2024-03-29 ENCOUNTER — Encounter: Payer: Self-pay | Admitting: Oncology

## 2024-03-29 DIAGNOSIS — E890 Postprocedural hypothyroidism: Secondary | ICD-10-CM

## 2024-04-26 ENCOUNTER — Other Ambulatory Visit: Payer: Self-pay | Admitting: Family Medicine

## 2024-04-26 DIAGNOSIS — E890 Postprocedural hypothyroidism: Secondary | ICD-10-CM

## 2024-04-26 NOTE — Telephone Encounter (Unsigned)
 Copied from CRM #[id number]. Topic: Clinical - Medication Refill >> Apr 26, 2024  3:42 PM Tobias L wrote: Medication: levothyroxine  (SYNTHROID ) 125 MCG tablet  Has the patient contacted their pharmacy? Yes Patient received message from pharmacy to reach out to office for further refills.  This is the patient's preferred pharmacy:  [facility]) - [place], [place] - [address] [facility] [address] [place] [address] [place] [address] Phone: [phone] Fax: [phone]  Is this the correct pharmacy for this prescription? Yes  Has the prescription been filled recently? No  Is the patient out of the medication? No  Has the patient been seen for an appointment in the last year OR does the patient have an upcoming appointment? Yes  Can we respond through MyChart? No  Agent: Please be advised that Rx refills may take up to 3 business days. We ask that you follow-up with your pharmacy.

## 2024-04-27 MED ORDER — LEVOTHYROXINE SODIUM 125 MCG PO TABS: 125.0000 ug | ORAL_TABLET | Freq: Every day | ORAL | 2 refills | Status: AC

## 2024-04-27 NOTE — Telephone Encounter (Signed)
 Requested Prescriptions  Pending Prescriptions Disp Refills   levothyroxine  (SYNTHROID ) 125 MCG tablet 100 tablet 2    Sig: Take 1 tablet (125 mcg total) by mouth daily before breakfast.     Endocrinology:  Hypothyroid Agents Passed - 04/27/2024  5:48 PM      Passed - TSH in normal range and within 360 days    TSH  Date Value Ref Range Status  03/17/2024 0.64 0.40 - 4.50 mIU/L Final         Passed - Valid encounter within last 12 months    Recent Outpatient Visits           5 months ago Dyslipidemia associated with type 2 diabetes mellitus [facility])   [place] [facility] Glenard Mire, MD       Future Appointments             In 1 week Sowles, Krichna, MD [facility], [place]

## 2024-05-03 ENCOUNTER — Other Ambulatory Visit: Payer: Self-pay | Admitting: Family Medicine

## 2024-05-03 DIAGNOSIS — E1169 Type 2 diabetes mellitus with other specified complication: Secondary | ICD-10-CM

## 2024-05-06 NOTE — Telephone Encounter (Signed)
 Too soon for refill, LRF 12/16/23 FOR 90 AND 1 RF.  Requested Prescriptions  Pending Prescriptions Disp Refills   rosuvastatin  (CRESTOR ) 5 MG tablet [Pharmacy Med Name: Rosuvastatin  Calcium  5 MG Oral Tablet] 80 tablet 3    Sig: TAKE 1 TABLET BY MOUTH IN THE  MORNING     Cardiovascular:  Antilipid - Statins 2 Failed - 05/06/2024  8:36 AM      Failed - Cr in normal range and within 360 days    Creatinine  Date Value Ref Range Status  03/17/2024 1.03 (H) 0.44 - 1.00 mg/dL Final   Creat  Date Value Ref Range Status  04/05/2022 0.95 0.50 - 1.05 mg/dL Final   Creatinine, POC  Date Value Ref Range Status  07/11/2017 negative mg/dL Final   Creatinine, Urine  Date Value Ref Range Status  11/05/2023 93 20 - 275 mg/dL Final         Failed - Lipid Panel in normal range within the last 12 months    Cholesterol  Date Value Ref Range Status  11/05/2023 117 <200 mg/dL Final   LDL Cholesterol (Calc)  Date Value Ref Range Status  11/05/2023 54 mg/dL (calc) Final    Comment:    Reference range: <100 . Desirable range <100 mg/dL for primary prevention;   <70 mg/dL for patients with CHD or diabetic patients  with > or = 2 CHD risk factors. SABRA LDL-C is now calculated using the Martin-Hopkins  calculation, which is a validated novel method providing  better accuracy than the Friedewald equation in the  estimation of LDL-C.  Gladis APPLETHWAITE et al. SANDREA. 7986;689(80): 2061-2068  (http://education.[facility].com/faq/FAQ164)    HDL  Date Value Ref Range Status  11/05/2023 45 (L) > OR = 50 mg/dL Final   Triglycerides  Date Value Ref Range Status  11/05/2023 98 <150 mg/dL Final         Passed - Patient is not pregnant      Passed - Valid encounter within last 12 months    Recent Outpatient Visits           6 months ago Dyslipidemia associated with type 2 diabetes mellitus [facility])   [place] [facility] Glenard Mire, MD       Future Appointments              In 4 days Sowles, Krichna, MD [facility], [place]

## 2024-05-10 ENCOUNTER — Ambulatory Visit: Admitting: Family Medicine

## 2024-05-10 ENCOUNTER — Encounter: Payer: Self-pay | Admitting: Family Medicine

## 2024-05-10 VITALS — BP 128/80 | HR 91 | Resp 16 | Ht 67.5 in | Wt 252.3 lb

## 2024-05-10 DIAGNOSIS — Z8542 Personal history of malignant neoplasm of other parts of uterus: Secondary | ICD-10-CM

## 2024-05-10 DIAGNOSIS — E1169 Type 2 diabetes mellitus with other specified complication: Secondary | ICD-10-CM

## 2024-05-10 DIAGNOSIS — L4050 Arthropathic psoriasis, unspecified: Secondary | ICD-10-CM | POA: Diagnosis not present

## 2024-05-10 DIAGNOSIS — M05741 Rheumatoid arthritis with rheumatoid factor of right hand without organ or systems involvement: Secondary | ICD-10-CM

## 2024-05-10 DIAGNOSIS — E785 Hyperlipidemia, unspecified: Secondary | ICD-10-CM | POA: Diagnosis not present

## 2024-05-10 DIAGNOSIS — E890 Postprocedural hypothyroidism: Secondary | ICD-10-CM

## 2024-05-10 DIAGNOSIS — G620 Drug-induced polyneuropathy: Secondary | ICD-10-CM

## 2024-05-10 DIAGNOSIS — J3089 Other allergic rhinitis: Secondary | ICD-10-CM

## 2024-05-10 DIAGNOSIS — M05742 Rheumatoid arthritis with rheumatoid factor of left hand without organ or systems involvement: Secondary | ICD-10-CM

## 2024-05-10 LAB — POCT GLYCOSYLATED HEMOGLOBIN (HGB A1C): Hemoglobin A1C: 5.8 % — AB (ref 4.0–5.6)

## 2024-05-10 MED ORDER — LORATADINE 10 MG PO TABS: 10.0000 mg | ORAL_TABLET | Freq: Every day | ORAL | 1 refills | Status: AC | PRN

## 2024-05-10 MED ORDER — ROSUVASTATIN CALCIUM 5 MG PO TABS: 5.0000 mg | ORAL_TABLET | Freq: Every morning | ORAL | 1 refills | Status: AC
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
## 2024-05-17 ENCOUNTER — Ambulatory Visit
Admission: RE | Admit: 2024-05-17 | Discharge: 2024-05-17 | Disposition: A | Discharge status: Home / Self Care | Source: Ambulatory Visit | Attending: Family Medicine | Admitting: Family Medicine

## 2024-05-17 DIAGNOSIS — Z1231 Encounter for screening mammogram for malignant neoplasm of breast: Secondary | ICD-10-CM | POA: Insufficient documentation

## 2024-05-23 ENCOUNTER — Ambulatory Visit: Payer: Self-pay | Admitting: Family Medicine

## 2024-05-24 ENCOUNTER — Other Ambulatory Visit: Payer: Self-pay | Admitting: Family Medicine

## 2024-05-24 DIAGNOSIS — R928 Other abnormal and inconclusive findings on diagnostic imaging of breast: Secondary | ICD-10-CM

## 2024-05-28 ENCOUNTER — Other Ambulatory Visit

## 2024-05-28 ENCOUNTER — Inpatient Hospital Stay: Admission: RE | Admit: 2024-05-28 | Source: Ambulatory Visit

## 2024-06-09 ENCOUNTER — Inpatient Hospital Stay: Attending: Obstetrics and Gynecology | Admitting: Obstetrics and Gynecology

## 2024-06-09 ENCOUNTER — Encounter: Payer: Self-pay | Admitting: Obstetrics and Gynecology

## 2024-06-09 VITALS — BP 141/80 | HR 91 | Temp 98.6°F | Resp 19 | Wt 253.8 lb

## 2024-06-09 DIAGNOSIS — R911 Solitary pulmonary nodule: Secondary | ICD-10-CM | POA: Diagnosis not present

## 2024-06-09 DIAGNOSIS — Z8542 Personal history of malignant neoplasm of other parts of uterus: Secondary | ICD-10-CM | POA: Diagnosis present

## 2024-06-09 DIAGNOSIS — C55 Malignant neoplasm of uterus, part unspecified: Secondary | ICD-10-CM
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
## 2024-06-10 ENCOUNTER — Inpatient Hospital Stay: Admission: RE | Admit: 2024-06-10 | Discharge: 2024-06-10 | Attending: Family Medicine | Admitting: Family Medicine

## 2024-06-10 ENCOUNTER — Inpatient Hospital Stay: Admission: RE | Admit: 2024-06-10

## 2024-06-10 DIAGNOSIS — R928 Other abnormal and inconclusive findings on diagnostic imaging of breast: Secondary | ICD-10-CM | POA: Insufficient documentation

## 2024-06-11 ENCOUNTER — Other Ambulatory Visit: Payer: Self-pay

## 2024-06-11 ENCOUNTER — Ambulatory Visit: Payer: Self-pay | Admitting: Family Medicine

## 2024-06-11 DIAGNOSIS — R928 Other abnormal and inconclusive findings on diagnostic imaging of breast: Secondary | ICD-10-CM

## 2024-06-16 ENCOUNTER — Encounter: Payer: Self-pay | Admitting: Oncology

## 2024-06-25 ENCOUNTER — Encounter: Payer: Self-pay | Admitting: Oncology

## 2024-06-28 ENCOUNTER — Encounter: Payer: Self-pay | Admitting: Oncology

## 2024-06-29 ENCOUNTER — Ambulatory Visit
Admission: RE | Admit: 2024-06-29 | Discharge: 2024-06-29 | Disposition: A | Discharge status: Home / Self Care | Source: Ambulatory Visit | Attending: Oncology | Admitting: Oncology

## 2024-06-29 ENCOUNTER — Encounter: Payer: Self-pay | Admitting: Oncology

## 2024-06-29 DIAGNOSIS — R918 Other nonspecific abnormal finding of lung field: Secondary | ICD-10-CM | POA: Insufficient documentation

## 2024-07-07 ENCOUNTER — Ambulatory Visit: Payer: Self-pay | Admitting: Oncology

## 2024-09-07 ENCOUNTER — Ambulatory Visit: Admitting: Family Medicine

## 2024-09-16 ENCOUNTER — Other Ambulatory Visit

## 2024-09-17 ENCOUNTER — Other Ambulatory Visit

## 2024-09-23 ENCOUNTER — Ambulatory Visit: Admitting: Oncology

## 2024-09-24 ENCOUNTER — Ambulatory Visit: Admitting: Oncology

## 2024-11-18 ENCOUNTER — Ambulatory Visit

## 2024-12-08 ENCOUNTER — Inpatient Hospital Stay
